# Patient Record
Sex: Male | Born: 1975 | Race: White | Smoking: Former smoker
Health system: Northeastern US, Academic
[De-identification: ages and names within clinical notes are randomized; demographics above are authoritative.]

## PROBLEM LIST (undated history)

## (undated) DIAGNOSIS — F17200 Nicotine dependence, unspecified, uncomplicated: Secondary | ICD-10-CM

## (undated) DIAGNOSIS — R42 Dizziness and giddiness: Secondary | ICD-10-CM

## (undated) DIAGNOSIS — J988 Other specified respiratory disorders: Secondary | ICD-10-CM

## (undated) DIAGNOSIS — I10 Essential (primary) hypertension: Secondary | ICD-10-CM

## (undated) DIAGNOSIS — F32A Depression, unspecified: Secondary | ICD-10-CM

## (undated) DIAGNOSIS — F419 Anxiety disorder, unspecified: Secondary | ICD-10-CM

## (undated) HISTORY — PX: LUMBAR FUSION: SHX111

## (undated) HISTORY — DX: Depression, unspecified: F32.A

## (undated) HISTORY — DX: Anxiety disorder, unspecified: F41.9

## (undated) HISTORY — DX: Other specified respiratory disorders: J98.8

## (undated) HISTORY — DX: Nicotine dependence, unspecified, uncomplicated: F17.200

## (undated) HISTORY — PX: TYMPANOSTOMY TUBE PLACEMENT: SHX32

---

## 1997-12-07 HISTORY — PX: SPINAL FUSION: SHX223

## 2005-09-07 DIAGNOSIS — F988 Other specified behavioral and emotional disorders with onset usually occurring in childhood and adolescence: Secondary | ICD-10-CM | POA: Insufficient documentation

## 2009-03-15 DIAGNOSIS — J309 Allergic rhinitis, unspecified: Secondary | ICD-10-CM | POA: Insufficient documentation

## 2009-10-08 ENCOUNTER — Ambulatory Visit: Payer: Self-pay | Admitting: Primary Care

## 2009-11-13 ENCOUNTER — Ambulatory Visit: Payer: Self-pay | Admitting: Primary Care

## 2009-11-22 ENCOUNTER — Ambulatory Visit: Payer: Self-pay | Admitting: Orthopedic Surgery

## 2009-11-28 ENCOUNTER — Ambulatory Visit: Payer: Self-pay | Admitting: Optometry

## 2009-11-28 NOTE — Progress Notes (Signed)
[name] returns for an unexpected visit.  He is now 33 years of age and is 12  years out from a Gill-type laminectomy of L5 with fusion L5 to S1 done for  a spondylolisthesis.  He returns for acute left-sided low back pain.    PAST MEDICAL HISTORY:  The patient's past medical history was updated.  He  has no known drug allergies.  Active problems include rhinitis and ADHD.    CURRENT MEDICATIONS:  Include Aleve, Flexeril, and Vicodin.    SOCIAL: He is now married and expecting his 1st child with his wife in  April.  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  He is a  nonsmoker.    HISTORY OF PRESENT ILLNESS:  The patient reports his complaints of pain  date back to 1 week ago when he was shoveling snow and developed acute  left-sided low back pain.  The pain was quite severe, and he was  essentially in bed for 4 days.  He has been using Aleve, Flexeril, Vicodin,  and heat.  He is just beginning to report some improvement in his back  complaints and has begun to increase his activity level.  He was  out of  work as a result of the acuity of pain.   He reports these acute flare-ups  approximately once a year.  The pain is always left-sided, and he has never  experienced any radicular or neurologic type of symptoms.  He exercises  regularly.  His exercise program is mostly running.  He has participated in  several marathons and is  training for an Ironman competition in February.  He does very little core strengthening and lumbar strengthening in his  daily routine.    PHYSICAL EXAMINATION:  He is a well-developed, well-nourished, very fit  young man.  He does not present in any acute pain and is ambulating easily.  Extension is performed just beyond neutral with some mild increased  complaints of pain.  Flexion is performed without any difficulty.  Tenderness is noted over the left lumbar para-musculature.    X-RAYS:  I reviewed x-rays taken today in the office.  There is abundant  fusion mass noted at  the L5-S1 level with no evidence of hardware failure.  Very mild adjacent segment degenerative changes noted.  This has not  appreciably changed since his last x-ray in 2009.    PLAN:  I had a nice discussion with [name].  He is quite concerned that he  has these flare-ups on a yearly basis and is worried that when he is in his  20s and 23s he will experiemce chronic problems.  He is quite fit and does  quite a bit of running, but does very little for his core strength.  I  recommended that he meet with [name] in our active spine program through  Sports Medicine.  It would be worthwhile for him to incorporate some lumbar  stabilization and core strengthening into his workout routine.  He is  agreeable to do so.  We discussed the use of Aleve or naproxen for his  flare-ups.  Given that he is feeling much better, he does not need any new  prescriptions at this time.  He plans to gradually ramp up his activity  level.  He does not need any ongoing follow-up and will be seen on an  as-needed basis.  Dictated by:                                                             Lowella Grip,                                                             NP  Electronically Signed and Finalized by  Lowella Grip, NP 11/28/2009 10:16                                                                                                                       ___________________________________________                                                             Lowella Grip,                                                             NP  DD:   11/22/2009  DT:   11/22/2009 11:50 A  ZO/XW#[id number]  [id number]    cc:   Cheri Fowler, MD

## 2009-12-03 ENCOUNTER — Ambulatory Visit: Payer: Self-pay | Admitting: Rehabilitative and Restorative Service Providers"

## 2010-01-28 ENCOUNTER — Ambulatory Visit: Payer: Self-pay | Admitting: Primary Care

## 2010-01-28 NOTE — Progress Notes (Signed)
 Reason For Visit   URI.  HPI   ** Medication reconciliation completed and updated      [name] complains of URI symptoms.     ONSET:  1 days ago.     SYMPTOMS:         Cough  --none         Nasal Congestion        Rhinorrhea with clear phlegm.        No Fever       Chills:  Yes-initially       Sore Throat:  No       Post Nasal Drip:  Yes       Otalgia: No but right side has mild decreased hearing.  No tinnitus.   It started last week after swimming -he is training for a triathelon.  No   recent flying.  He was at a rock concert 01/26/10. Had tubes in his ears as   a child by Dr Rocky Morel.          Hoarseness:  No       Sinus Tenderness:  No       Headache:  No       Shortness of Breath:  No     RELIEVED BY:  took no meds  .  Allergies   No Known Drug Allergy.  Current Meds   None.  Active Problems   Allergic Rhinitis (477.9); h/o allergy shots  Attention-deficit / Hyperactivity Disorder (314.01)  MRI Knee Medial Collateral Ligament Complete Tear Mar 2005; Right  MRI Knee Medial Meniscus Tear Mar 2005; Right.  Vital Signs   Recorded by [facility] on 28 Jan 2010 01:10 PM  BP:102/52,  LUE,  Sitting,   HR: 60 b/min,  L Radial,   Temp: 98.6 F,   Weight: 199.6 lb.  Physical Exam   HEENT: Normal eyes.  Ears normal with normal hearing to whisper   bilaterally. Oropharynx normal. No rhinitis.  NECK: Normal  LUNGS: Clear to auscultation bilaterally     Assessment/Plan:     1.  Viral URI: Presently mild.  He will try Afrin nasal spray twice a day   x3 days as needed.  Call if no better  2.  Probable right eustachian tube dysfunction: Suspect is viral URI has   worsened his symptoms.  He also was at a rock concert 2 days ago which   probably also didn't help.  He also has been swimming for triathlon   training which may be contributing.  He will try Afrin nasal spray twice a   day x3 days, take 2 days off and then resume Afrin x3 days if needed.  If   no better/worse, will consider having him go to Dr. Delrae Alfred  and/or use   Flonase.  He will also start using earplugs when swimming.     RETURN: 02/04/10 HTN/right eustachian tube dysfunction  .  Signature   Electronically signed by: [name]  M.D.; 01/28/2010 1:41 PM EST.

## 2010-02-04 ENCOUNTER — Ambulatory Visit: Payer: Self-pay | Admitting: Primary Care

## 2010-02-04 NOTE — Progress Notes (Signed)
 Reason For Visit   HTN/Right eustacian tube dysfunction.  HPI   HYPERTENSION MANAGEMENT: [name] is here for hypertension   follow-up.  SYMPTOMS:   Other: had 2 episodes dizziness the past 2 days- 1 was after   exersise and 1 was mid day   PATIENT DENIES: Headache, Chest pain,  Palpitations, Shortness of Breath   HABITS:   --Patient has been following a reduced sodium diet.  --He is getting adequate exercise.  --Smoking: No.   --Caffeine Use:  Yes.  cups per day:  3   HOME BLOOD PRESSURE: Does not take.    MEDICATIONS: None He  taking antihypertensive medications correctly     His right hearing has improved since last visit.  Wakes up congested at   times but it improves as the day goes on.       ** Medication reconciliation completed and updated.  Allergies   No Known Drug Allergy.  Current Meds   None.  Active Problems   Allergic Rhinitis (477.9); h/o allergy shots  Attention-deficit / Hyperactivity Disorder (314.01)  MRI Knee Medial Collateral Ligament Complete Tear Mar 2005; Right  MRI Knee Medial Meniscus Tear Mar 2005; Right.  Vital Signs   Recorded by [facility] on 04 Feb 2010 04:01 PM  BP:108/62,  LUE,  Sitting,   HR: 76 b/min,  L Radial,   Weight: 197.4 lb.  Physical Exam   HEARING:  Normal whisper bilaterally  HEART: Regular rate and rhythm, no murmurs gallops or rubs.  LUNGS: Clear to auscultation bilaterally.     Assessment/Plan:     1.  HTN: Patient is actually low.  He is on no medicine.  He does drink at   least 3 cups of coffee a day.  I did recommend he maintain adequate   hydration.  He was also advised to check his blood pressure in the store   and call me if it is still low or he still feels dizzy.  2.  Right eustachian tube dysfunction/decreased hearing: Resolved.  Call if   symptoms recur     RETURN: 3 months HTN.  Signature   Electronically signed by: [name]  M.D.; 02/04/2010 4:28 PM EST.

## 2010-05-13 ENCOUNTER — Encounter: Payer: Self-pay | Admitting: Gastroenterology

## 2010-05-13 ENCOUNTER — Ambulatory Visit: Payer: Self-pay | Admitting: Primary Care

## 2010-05-13 NOTE — Progress Notes (Signed)
Reason For Visit   HTN.  HPI   HYPERTENSION MANAGEMENT: [name] is here for hypertension   follow-up.  SYMPTOMS:    PATIENT DENIES: Headache, Chest pain,  Palpitations, Shortness of Breath   HABITS:   --Patient has been following a reduced sodium diet.  --He is getting adequate exercise-bikes/jogs/swims  --Smoking: No.   --Caffeine Use:  Yes.  cups per day:  2   HOME BLOOD PRESSURE: Does not take.    MEDICATIONS: None      Patient has lost 8 pounds since 01/2010.  His father does have hypertension     ** Medication reconciliation completed and updated.  Allergies   No Known Drug Allergy.  Current Meds   None.  Active Problems   Allergic Rhinitis (477.9); h/o allergy shots  Attention-deficit / Hyperactivity Disorder (314.01)  MRI Knee Medial Collateral Ligament Complete Tear Mar 2005; Right  MRI Knee Medial Meniscus Tear Mar 2005; Right.  Vital Signs   Recorded by [facility] on 13 May 2010 03:56 PM  BP:132/84,  LUE,  Sitting,   HR: 74 b/min,  L Radial, Regular,   Weight: 191 lb.  Recorded by [facility] on 13 May 2010 04:21 PM  BP:110/80.  Physical Exam   HEART: Regular rate and rhythm, no murmurs gallops or rubs.  LUNGS: Clear to auscultation bilaterally.  .  Assessment   Assessment/Plan:     1. HYPERTENSION  --According to JNC 7 guidelines target BP: less than 140/90 patient   currently is at goal  Plan to reach goal includes:  --Lifestyle Modifications: weight reduction; discussed dietary sodium   reduction; discussed aerobic physical activity    --Medication Management: Currently on no medicine     RETURN: end 09/2010 HTN  .  Signature   Electronically signed by: [name]  M.D.; 05/13/2010 4:27 PM EST.

## 2010-06-12 ENCOUNTER — Ambulatory Visit: Payer: Self-pay | Admitting: Orthopedic Surgery

## 2010-06-13 ENCOUNTER — Other Ambulatory Visit: Payer: Self-pay | Admitting: Orthopedic Surgery

## 2010-06-13 ENCOUNTER — Ambulatory Visit: Payer: Self-pay | Admitting: Orthopedic Surgery

## 2010-06-13 DIAGNOSIS — IMO0002 Reserved for concepts with insufficient information to code with codable children: Secondary | ICD-10-CM

## 2010-06-14 ENCOUNTER — Emergency Department
Admission: EM | Admit: 2010-06-14 | Disposition: A | Discharge status: Home / Self Care | Payer: Self-pay | Source: Ambulatory Visit | Attending: Emergency Medicine | Admitting: Emergency Medicine

## 2010-06-14 ENCOUNTER — Encounter: Payer: Self-pay | Admitting: Emergency Medicine

## 2010-06-14 HISTORY — DX: Essential (primary) hypertension: I10

## 2010-06-14 MED ORDER — KETOROLAC TROMETHAMINE 30 MG/ML IJ SOLN *I*
30.0000 mg | Freq: Once | INTRAMUSCULAR | Status: AC
Start: 2010-06-14 — End: 2010-06-14
  Filled 2010-06-14: qty 1

## 2010-06-14 MED ORDER — DIAZEPAM 5 MG PO TABS *I*
5.0000 mg | ORAL_TABLET | Freq: Once | ORAL | Status: AC
Start: 2010-06-14 — End: 2010-06-14
  Administered 2010-06-14: 5 mg via ORAL
  Filled 2010-06-14: qty 1

## 2010-06-14 MED ORDER — HYDROMORPHONE HCL 4 MG PO TABS *I*
4.0000 mg | ORAL_TABLET | Freq: Once | ORAL | Status: AC
Start: 2010-06-14 — End: 2010-06-14
  Administered 2010-06-14: 4 mg via ORAL
  Filled 2010-06-14: qty 1

## 2010-06-14 MED ORDER — DIAZEPAM 5 MG PO TABS *I*
5.0000 mg | ORAL_TABLET | Freq: Three times a day (TID) | ORAL | Status: AC | PRN
Start: 2010-06-14 — End: 2010-06-21

## 2010-06-14 MED ORDER — HYDROMORPHONE HCL 2 MG/ML IJ SOLN
2.0000 mg | Freq: Once | INTRAMUSCULAR | Status: AC
Start: 2010-06-14 — End: 2010-06-14
  Filled 2010-06-14: qty 1

## 2010-06-14 MED ORDER — HYDROMORPHONE HCL 4 MG PO TABS *I*
4.0000 mg | ORAL_TABLET | ORAL | Status: AC | PRN
Start: 2010-06-14 — End: 2010-06-24

## 2010-06-14 NOTE — ED Notes (Addendum)
Patient with spinal fusion in 1998 with Dr. Isabell Jarvis, has been experiencing severe lower back pain radiating down left leg x 72 hours, states he feels like he is being stabbed in leg.  Has appointment with Dr. Isabell Jarvis on Wednesday for myelogram (cannot get MRI due to hardware in back) Taking vicodin, prednisone, and gabapentin.  Pain has not had any improvement with these.     Took Vicodin at 11.    Has not had BM since Thursday.     Will order dilaudid, toradol, valium

## 2010-06-14 NOTE — Discharge Instructions (Signed)
You have been evaluated for low back pain with radicular symptoms in your left leg.  You have been prescribed pain medicine to relieve your symptoms until your CY myelogram on Wednesday.  Take 1 dilaudid every 4 hours. If this does not work, you may try 2 tablets every 4 hours for pain.  Take valium 5mg , 1 tab every 8 hours. Discontinue the vicodin.  Avoid lifting and strenuous tasks.

## 2010-06-14 NOTE — ED Notes (Signed)
Pt resting comfortably in bed. VSS, AOx3, will cont to monitor & tx as ordered.

## 2010-06-14 NOTE — ED Notes (Signed)
Pt arrives with c/o lower back pain & LLE pain x 3 days. Hx of spinal fusion in 1998. Supposed to have myelogram done this coming Wednesday, but pain is progressively getting worse since starting Vicodin, prednisone & gabapentin 2 days ago. Pt denies numbness in LLE, but does c/o tingling in LLE. Pt is AOx3, VSS, will cont to monitor & tx as ordered.

## 2010-06-14 NOTE — ED Provider Notes (Signed)
History   Chief Complaint   Patient presents with   . Back Pain     hx bone; spur,  has pain L leg       HPI Comments: 34yo male c/o back pain for 3 weeks. Lower back pain after lifting a heavy object, and has slowly worsened. Stabbing pain in his left hip, quadriceps and left foot began on Thursday, and have slowly worsened. Patient complains of burning, aching, muscle twitches, and tingling. Areas affected include lateral lower leg, quadricep, and hip. Complains of numbness in tingling in feet and lateral lower leg, more on the left than on the right. Denies urinary problems, fever, shooting pain. He was seen 06/12/10 in the spine clinic and perscribed gabapentin, vicodin, and prednisone.    The history is provided by the patient.       Past Medical History   Diagnosis Date   . Asthma    . Hypertension      monitored every 4 months, no medication         Past Surgical History   Procedure Date   . Spinal fusion L5-S1         History reviewed.  No pertinent family history.     reports that he has never smoked. He does not have any smokeless tobacco history on file.  He reports that he drinks about 2.5 ounces of alcohol per week.  He reports that he does not currently use illicit drugs.    Review of Systems   Review of Systems   Constitutional: Negative for fever, chills and diaphoresis.   HENT: Negative.    Eyes: Negative.  Negative for visual disturbance.   Respiratory: Negative.  Negative for cough, shortness of breath and wheezing.    Cardiovascular: Negative for chest pain and leg swelling.   Gastrointestinal: Positive for constipation. Negative for vomiting and diarrhea.   Genitourinary: Positive for testicular pain (when standing, left sided). Negative for flank pain and difficulty urinating.   Musculoskeletal: Positive for back pain.        Left hip pain, left quadriceps twitching, left lower leg pain, numbness and tingling in toes and shins, more on the left than on the right.    Skin: Negative for rash.      Neurological: Positive for weakness and light-headedness (intermittend/positional). Negative for tremors and syncope.        Left toes partially numb, tingling.   Psychiatric/Behavioral: Negative.        Physical Exam   BP 150/92  Pulse 60  Temp(Src) 36.2 C (97.2 F) (Temporal)  Resp 19  Ht 1.753 m (5\' 9" )  Wt 86.183 kg (190 lb)  BMI 28.06 kg/m2  SpO2 96%    Physical Exam   Constitutional: He is oriented to person, place, and time. He appears well-developed and well-nourished.   HENT:   Head: Normocephalic and atraumatic.   Right Ear: External ear normal.   Left Ear: External ear normal.   Eyes: Conjunctivae and EOM are normal. Pupils are equal, round, and reactive to light.   Neck: Normal range of motion. Neck supple.   Cardiovascular: Normal rate, regular rhythm, normal heart sounds and intact distal pulses.    Pulmonary/Chest: Effort normal and breath sounds normal. No respiratory distress. He has no wheezes. He has no rales.   Abdominal: Soft. Bowel sounds are normal. No tenderness.   Musculoskeletal:        Decreased ROM of back flexion/extension. Rotation to 70 degrees bilaterally.   Neurological:  He is alert and oriented to person, place, and time. He has normal reflexes.        Slight weakness of left lower extremity, reflexes 2+/4 bilaterally. Slight numbness in left foot, lateral lower leg, and thigh.   Skin: Skin is warm and dry. No rash noted. He is not diaphoretic.   Psychiatric: He has a normal mood and affect.       Medical Decision Making   MDM  Number of Diagnoses or Management Options  Diagnosis management comments: Patient seen by me today, 06/14/2010 at 1500.    Assessment:  34 y.o., male comes to the ED with  Low back pain, that is worsening, with radicular symptoms.  Differential Diagnosis includes bulging disk, piriformis syndrome, spinal stenosis, bone spur, lumbar strain.    Plan: Patient has myelogram scheduled for Wednesday. Will focus on pain control until that time. Will try to  arrange myelogram to be moved up to get it sooner. Plan to call radiology.  1. Dilaudid 4mg  PO q4hrs prn pain  2. Valium                 Jaci Carrel, MD      Patient seen by me on arrival date of 06/14/2010 at 1633.    History:   I reviewed this patient, reviewed the resident note and agree    Exam:   I examined this patient, reviewed the resident note and agree    Decision Making:   I discussed with the documented resident decision making  and agree          Author Jarrett Ables, MD      Jarrett Ables, MD  06/15/10 [phone]

## 2010-06-14 NOTE — ED Notes (Signed)
Pt resting comfortably in bed with MD at bedside. VSS, Will cont to monitor & tx as ordered.

## 2010-06-14 NOTE — ED Notes (Signed)
Pt here with his wife, for severe low back pain.  Has upcoming appt for further evaluation of his back.  Has hardware in his back.  Cannot have MRI.  To get myelogram later.  MLP ordering pain medication.  Pt has no bowel or bladder incontinence.

## 2010-06-17 ENCOUNTER — Ambulatory Visit: Payer: Self-pay | Admitting: Orthopedic Surgery

## 2010-06-18 ENCOUNTER — Ambulatory Visit
Admit: 2010-06-18 | Discharge: 2010-06-18 | Disposition: A | Discharge status: Home / Self Care | Payer: Self-pay | Source: Ambulatory Visit | Attending: Orthopedic Surgery | Admitting: Orthopedic Surgery

## 2010-06-18 MED ORDER — MEPERIDINE HCL 50 MG/ML IJ SOLN *WRAPPED*
INTRAMUSCULAR | Status: AC
Start: 2010-06-18 — End: 2010-06-18
  Filled 2010-06-18: qty 1

## 2010-06-18 MED ORDER — MIDAZOLAM HCL 2 MG/ML PO SYRP *I*
ORAL_SOLUTION | ORAL | Status: AC
Start: 2010-06-18 — End: 2010-06-18
  Filled 2010-06-18: qty 5

## 2010-06-18 NOTE — Progress Notes (Signed)
[name] returns today for an urgent follow-up visit.  He was last seen in  December 2010 for recurrent low back pain, which responded quite well to  NSAIDs and physical therapy through our sports medicine department.  He  returns today for acute and severe radiating left leg pain.    The patient is now 34 years of age and is 12 years out from a Gill-type  laminectomy of L5 with an instrumented fusion L5 to S1 done for  spondylolisthesis.  He reports continuing with some very low-grade  discomfort in the low back since being seen 6 months ago.  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  At that time he had  acute pain beginning in the left buttock and traveling down the left leg to  the shin.  He describes an L4 pattern to his symptoms with numbness and  tingling in the same distribution.  He contacted the office and was started  on a Medrol dose pack.  The first day of the dose pack helped his pain,  however, it quickly returned with days 2 through 5.  During today's visit  his pain is a 9/10 in severity and he appears quite uncomfortable.  The  pain is significantly worse when he is standing and walking.  He became  quite diaphoretic and nearly collapsed while getting his x-ray in Radiology  today.  He denies any bowel or bladder incontinence.  The pain is present24  hours a day.  He has the sensation that his left leg is weak and will not  support him when standing.    PHYSICAL EXAMINATION:  He is a well-developed, well-nourished male who  ambulates with an antalgic gait, favoring the left leg.  Blood pressure  155/86 with heart rate of 62 during his diaphoretic episode.  He has  diminished light touch sensation in the left L4 and L5 dermatomes.  Full  strength is noted with no focal weakness.  He has a positive seated  straight leg raise.    X-RAYS:  I obtained new x-rays today in the office.  There are 5  lumbar-type  vertebrae.  There is evidence of an instrumented fusion using  [place] at L5-S1.  Abundant fusion mass is noted.  There is some narrowing of  the foramina seen on the lateral at the level above the fusion.    ASSESSMENT:  Acute progressive lumbar radiculopathy.    PLAN:  The patient will be started on prednisone taper.  I he will be  started on gabapentin 300 mg at bedtime.  I have scheduled him for an  urgent CT myelogram to evaluate for any neural compression.  He was given a  prescription for some Vicodin.  I will call him at home with the results of  his myelogram as soon as they are available.  If indicated he may be a  candidate for an epidural injection with one of our physiatrists.                                                                   Dictated by:  Lowella Grip,                                                             NP  Electronically Signed and Finalized by  Lowella Grip, NP 06/18/2010 09:36                                                                                                                       ___________________________________________                                                             Lowella Grip,                                                             NP  DD:   06/12/2010  DT:   06/12/2010  3:53 P  [id number]  [id number]    cc:   Cheri Fowler, MD

## 2010-06-19 ENCOUNTER — Ambulatory Visit: Payer: Self-pay | Admitting: Anesthesiology

## 2010-06-19 ENCOUNTER — Ambulatory Visit: Payer: Self-pay | Admitting: Physical Medicine and Rehabilitation

## 2010-06-19 ENCOUNTER — Ambulatory Visit: Payer: Self-pay | Admitting: Neurosurgery

## 2010-06-20 DIAGNOSIS — M48061 Spinal stenosis, lumbar region without neurogenic claudication: Secondary | ICD-10-CM | POA: Insufficient documentation

## 2010-06-20 DIAGNOSIS — M5126 Other intervertebral disc displacement, lumbar region: Secondary | ICD-10-CM | POA: Insufficient documentation

## 2010-06-20 NOTE — H&P (Signed)
Preoperative H and P    Chief Complaint: leg pain    History of Present Illness:  HPI  34 year old male with a history of L5-S1 fusion in 1998 presents with severe left leg pain, worsening over the last month.  He notes that in Dec. 2010 he began having low back pain after shoveling snow.  This improved and returned several times; however the pain became worse with each exacerbation.  Approximately a month ago he began experiencing shooting pain in his left hip, anterior thing and calf.  He has had some associated numbness, but that has improved since starting gabepentin.  The leg pain has become more severe and now he is on dilaudid, valium, gabepentin and prednisone.  This enables him to tolerate the pain, but he does not like how he feels on the drugs.  He underwent CT Myelogram on 06/18/10 which demonstrated a very large L4-5 disc fragment.    He presents for elective surgery.    Past Medical History   Diagnosis Date   . Asthma    . Hypertension      monitored every 4 months, no medication       Past Surgical History   Procedure Date   . Spinal fusion L5-S1       History reviewed.  No pertinent family history.  History   Social History   . Marital Status: Married     Spouse Name: N/A     Number of Children: N/A   . Years of Education: N/A   Social History Main Topics   . Smoking status: Never Smoker    . Smokeless tobacco: None   . Alcohol Use: 2.5 oz/week     5 drink(s) per week   . Drug Use: No   . Sexually Active:    Other Topics Concern   . None   Social History Narrative   . None         Allergies: No Known Allergies    Medications  Outpatient prescriptions as of 06/14/2010   Medication   . diazepam (VALIUM) 5 MG tablet   . HYDROmorphone (DILAUDID) 4 MG tablet   . gabapentin (NEURONTIN) 300 MG capsule   . predniSONE (DELTASONE) 20 MG tablet   Facility-administered medications as of 06/14/2010   Medication Dose Frequency   . midazolam (VERSED) 2 MG/ML syrup      . meperidine (DEMEROL) 50 MG/ML injection      .  meperidine (DEMEROL) 50 MG/ML injection      . HYDROmorphone (DILAUDID) injection 2 mg  2 mg Once   . ketorolac (TORADOL) injection 30 mg  30 mg Once   . diazepam (VALIUM) tablet 5 mg  5 mg Once   . HYDROmorphone (DILAUDID) tablet 4 mg  4 mg Once   . diazepam (VALIUM) tablet 5 mg  5 mg Once       Review of Systems:   Review of Systems   Musculoskeletal: Positive for back pain.   All other systems reviewed and are negative.        Physical Exam   Constitutional: He is oriented to person, place, and time. He appears well-developed.   HENT:   Head: Normocephalic.   Eyes: Conjunctivae and EOM are normal. Pupils are equal, round, and reactive to light.   Neck: Normal range of motion.   Cardiovascular: Normal rate and regular rhythm.    Pulmonary/Chest: Effort normal.   Abdominal: Soft.   Musculoskeletal: Normal range of motion. He  exhibits no edema.   Neurological: He is alert and oriented to person, place, and time. He has normal strength. Coordination abnormal.   Reflex Scores:       Patellar reflexes are 0 on the right side and 0 on the left side.       Achilles reflexes are 0 on the right side and 0 on the left side.       Patient walks with an antalgic gait, favoring his left leg.   Skin: Skin is warm and dry.       Lab Results:     Radiology impressions (last 3 days):      Assessment: 34 year old with large herniated disc, failed conservative treatment.    Plan: minimally invasive discectomy.    Author: Juliann Pares, NP  Note created: 06/20/2010  at: 10:02 AM

## 2010-06-23 ENCOUNTER — Ambulatory Visit: Payer: Self-pay | Admitting: Orthopedic Surgery

## 2010-06-23 ENCOUNTER — Ambulatory Visit
Admit: 2010-06-23 | Discharge: 2010-06-23 | Disposition: A | Discharge status: Home / Self Care | Payer: Self-pay | Source: Ambulatory Visit | Attending: Neurosurgery | Admitting: Neurosurgery

## 2010-06-23 LAB — COMPREHENSIVE METABOLIC PANEL
ALT: 31 U/L (ref 0–50)
AST: 25 U/L (ref 0–50)
Albumin: 5.1 g/dL (ref 3.5–5.2)
Alk Phos: 69 U/L (ref 40–130)
Anion Gap: 10 (ref 7–16)
Bilirubin,Total: 0.4 mg/dL (ref 0.0–1.2)
CO2: 29 mmol/L — ABNORMAL HIGH (ref 20–28)
Calcium: 9.7 mg/dL (ref 9.0–10.3)
Chloride: 96 mmol/L (ref 96–108)
Creatinine: 0.85 mg/dL (ref 0.67–1.17)
GFR,Black: >59 *
GFR,Caucasian: >59 *
Glucose: 96 mg/dL (ref 74–106)
Lab: 20 mg/dL (ref 6–20)
Potassium: 4 mmol/L (ref 3.3–5.1)
Sodium: 135 mmol/L (ref 133–145)
Total Protein: 7.6 g/dL (ref 6.3–7.7)

## 2010-06-23 LAB — CBC AND DIFFERENTIAL
Baso # K/uL: 0.1 THOU/uL (ref 0.0–0.1)
Basophil %: 0.8 % (ref 0.2–1.2)
Eos # K/uL: 0 THOU/uL (ref 0.0–0.5)
Eosinophil %: 0 % — ABNORMAL LOW (ref 0.8–7.0)
Hematocrit: 45 % (ref 40–51)
Hemoglobin: 15.6 g/dL (ref 13.7–17.5)
Lymph # K/uL: 2.9 THOU/uL (ref 1.3–3.6)
Lymphocyte %: 21.7 % — ABNORMAL LOW (ref 21.8–53.1)
MCV: 89 fL (ref 79–92)
Mono # K/uL: 1.2 THOU/uL — ABNORMAL HIGH (ref 0.3–0.8)
Monocyte %: 9.2 % (ref 5.3–12.2)
Neut # K/uL: 8.8 THOU/uL — ABNORMAL HIGH (ref 1.8–5.4)
Platelets: 296 THOU/uL (ref 150–330)
RBC: 5 MIL/uL (ref 4.6–6.1)
RDW: 12.4 % (ref 11.6–14.4)
Seg Neut %: 66.7 % (ref 34.0–67.9)
WBC: 13 THOU/uL — ABNORMAL HIGH (ref 4.2–9.1)

## 2010-06-23 LAB — MANUAL DIFFERENTIAL

## 2010-06-23 LAB — PROTIME-INR
INR: 0.9 (ref 0.9–1.1)
Protime: 12.6 s (ref 11.9–14.7)

## 2010-06-23 LAB — APTT: aPTT: 25.2 s (ref 22.3–35.3)

## 2010-06-23 LAB — BANDS: Bands %: 1 % (ref 0–10)

## 2010-06-23 LAB — MISC. CELL %: Misc. Cell %: 0 % (ref 0–0)

## 2010-06-23 LAB — REACTIVE LYMPHS: React Lymph %: 1 % (ref 0–6)

## 2010-06-24 NOTE — Progress Notes (Signed)
[name] and his wife return.  The patient was operated by me for 5-1  fusion about 12 years ago.  He has done very well over the years with some  intermittent setbacks.  He reports that as of last fall, he trained for an  [employer].  In December, he did something to his back and has had some  increased back pain with a lot of frustration with this over the course of  the spring.  On June 18, he picked up at a casket at work, and something  happened where he had some decrease in back pain but a marked increase in  leg pain, and in fact, he is having severe pain down his left leg.  The  pain seems to be mostly in the L5 pattern.    I examined him today.  His leg touch is slightly diminished in an L4 aspect  of the left leg, and he has some weakness of the left EHL.    The patient has had x-rays and a myelogram.  He has a status post fusion  L5-S1 with a screw plate, [employer] stainless steel construct.  At the level of  L4-5, he has a large disk herniation which has migrated up behind the body  of L4 and causing substantial canal compromise.  It is a large disk  herniation.    We had a long discussion regarding his situation.  Since initially being  brought through my office with urgent care, he has sought out a second  opinion from a neurosurgeon and had an epidural injection.  This happened  to be the recommendation that we had made to him, but he did not want to  pursue it because he had not seen me.    The patient subsequently had the injection, and in fact, it has made quite  a difference for him.  Overall, he is perhaps 50% better.    I have discussed the situation with him.  Realistically, I think he ought  to give this some more time before jumping into any discussion of surgery.  A second injection might not be a bad idea either.  I have discussed with  him that if he had further surgery, I would recommend extending the fusion  up a level to L4.  Despite the fact that he finds this somewhat of a  distasteful  idea, I really think it is in his best interest.  I think if he  does not have the 4-5 segment fused, the chance of a recurrent problem at  that level is extremely high.  The patient's wife came part-way through our  visit.  We spent about 30 minutes talking about the situation and talking  about my recommendations for how I would proceed and why I would proceed in  this way.  The patient's questions and his wife's questions were answered  in great detail.  We have agreed to re-evaluate him again in 3 to 4 weeks.  I would be happy to organize an injection for him in a week or so if he is  still having a lot of pain.  Their questions were answered in detail.                Electronically Signed and Finalized  by  Danton Sewer, MD 06/24/2010 11:42  ___________________________________________  Danton Sewer, MD      DD:   06/23/2010  DT:   06/24/2010  9:42 A  [id number]  [id number]    cc:   Cheri Fowler, MD

## 2010-06-25 ENCOUNTER — Ambulatory Visit
Admit: 2010-06-25 | Discharge: 2010-06-25 | Payer: Self-pay | Source: Ambulatory Visit | Attending: Neurosurgery | Admitting: Neurosurgery

## 2010-07-01 ENCOUNTER — Ambulatory Visit: Payer: Self-pay | Admitting: Physical Medicine and Rehabilitation

## 2010-07-08 ENCOUNTER — Ambulatory Visit: Payer: Self-pay | Admitting: Neurosurgery

## 2010-07-15 ENCOUNTER — Ambulatory Visit: Payer: Self-pay | Admitting: Physical Medicine and Rehabilitation

## 2010-07-21 ENCOUNTER — Ambulatory Visit: Payer: Self-pay | Admitting: Orthopedic Surgery

## 2010-07-25 ENCOUNTER — Ambulatory Visit: Payer: Self-pay | Admitting: Orthopedic Surgery

## 2010-07-28 NOTE — Progress Notes (Signed)
[name] returns.  He is now 2 months since the onset of severe left leg pain.  He is off all pain medicines at this point.  His leg pain still occurs but  it is quite sporadic.  It can last a few seconds to a few minutes but he  does not have any continuous pain.  Upon until 2 weeks ago, he had numbness  in the lateral left calf but this is gone.    He is doing his full duties at work, other than being careful about  lifting.  He is really feeling a lot better.  He has been increasing his  activities with [name], his therapist.  He has been thinking about  going back to sports if I will okay it.    Today on exam, he walks with a nice gait.  Toe and heel walking are normal.  He reports no numbness or pain in his legs at this point.    I have given him clearance to continue working on core exercise with [name].  He may begin riding his bike.  The patient found that swimming  did not work out very well but I encouraged him to try it again after he  does more core strengthening.  We talked about the benefits of running and  the chances that he may need to hold off on running.    I encouraged the patient and his wife to think about things over the next  coming weeks as he increases his activities.  The next decision point is a  month from now.  If at that point he is not doing everything he wants to  do, he needs to decide what it is that he would like to do further, and  whether it is worth undergoing the theoretical risks of surgery.  We had an  excellent discussion.  He will come back to see me if he sees the need.                Electronically Signed and Finalized  by  Danton Sewer, MD 07/28/2010 12:25  ___________________________________________  Danton Sewer, MD      DD:   07/25/2010  DT:   07/26/2010  4:52 P  [id number]  [id number]    cc:   Cheri Fowler, MD

## 2010-08-06 ENCOUNTER — Ambulatory Visit: Payer: Self-pay | Admitting: Primary Care

## 2010-08-06 NOTE — Progress Notes (Signed)
Reason For Visit   Headaches.  HPI   ** Medication reconciliation completed and updated *     He has been getting headaches x 1 month.  Headaches are in his forehead and   can be sharp pain. No radiation.  No visual symptoms.  No N/V/photo or   phonophobia/injuries/f/c/eye tearing/rhinitis.  No paresthesias.  He does   feel more thirsty. Had a normal BG 06/2010. His left leg is weaker due to    herniated disc L4-L5.  He gets headaches 4-5 days a week.  They can last   all day at times.  He admits he has alot of stress in his life-having   lumbar back problems, busy at work, not sleeping well with a 54 month old.   Advil mildly helps but he admits to waiting to take it.   Mother has   nigraines and he took her Zomig which did not help.  Allergies   No Known Drug Allergy.  Current Meds   Multivitamins Tablet;TAKE 1 TABLET DAILY.; RPT  Glucosamine-Chondroitin-MSM TABS;TAKE 1 TABLET DAILY; RPT  Vitamin D 1000 UNIT Tablet;TAKE 1 TABLET DAILY.; RPT.  Active Problems   Allergic Rhinitis (477.9); h/o allergy shots  Attention-deficit / Hyperactivity Disorder (314.01)  Herniated Lumbar Disc (722.10); L4-L5  Severe Lumbar Canal Stenosis (724.02); L4-L5  MRI Knee Medial Collateral Ligament Complete Tear Mar 2005; Right  MRI Knee Medial Meniscus Tear Mar 2005; Right  History of Reactive Airway Disease (493.90).  Vital Signs   Recorded by ROSA,JODIE on 06 Aug 2010 01:01 PM  BP:152/84,  LUE,  Sitting,   HR: 80 b/min,  L Radial, Regular,   Weight: 187 lb.  Physical Exam   GENERAL: Pleasant 34 year old white male in no acute distress.  Alert and   oriented x3  NEURO:  Cranial nerves 2-12 intact.  Normal motor/sensory/reflexes except   left lower leg has 4+/5 motor strength with slightly decreased pinprick and   reflexes     Assessment/Plan:     1.  Headaches: Unclear cause.  Probably atypical migraine especially given   family history of migraines in his mother.  Suspect his left lower   extremity deficits are from his herniated disc.   Patient does admit to   being more thirsty while these headaches have been occurring.  Blood sugar   last month was normal.  Will check a CT of the head.  We did talk about   possibly using narcotic medications for pain, however he declined since he   has taken them in the past and can get sedated/constipated.  He is want to   try some indomethacin 25 mg twice a day as needed with food.  Side effects   reviewed.  I did give him a handout on headaches and reviewed triggers of   migraines.  He was advised to keep a headache diary.  Patient was advised   to call me if not better/worse.  He may require a consultation from Dr. Loreta Ave        RETURN: end 09/2010 HTN.  Signature   Electronically signed by: [name]  M.D.; 08/06/2010 3:13 PM EST.

## 2010-08-08 ENCOUNTER — Other Ambulatory Visit: Payer: Self-pay | Admitting: Primary Care

## 2010-09-02 ENCOUNTER — Ambulatory Visit: Payer: Self-pay | Admitting: Primary Care

## 2010-09-02 NOTE — Progress Notes (Signed)
Reason For Visit   Chest pain.  HPI   ** Medication reconciliation completed and updated      He has had dull episodic left chest pain x 1 week.  More often in the   morning.  No radiation of the pain.  He has had some occasional tingelling   in left arm   1 week as well but independent of the chest pain.  No N/V/heartburn/cough.    No wheezing but occasionally feels SOB.  Nothing makes the CP   better/worse-it resolves on its own.  Denies any recent heavy lifting.    Denies any new stressors.  He did drive to [facility] 1/61/09 and drove   back 08/04/10.  No calf pain/swelling.  Allergies   No Known Drug Allergy.  Current Meds   Vitamin D 1000 UNIT Tablet;TAKE 1 TABLET DAILY.; RPT  Glucosamine-Chondroitin-MSM TABS;TAKE 1 TABLET DAILY; RPT  Multivitamins Tablet;TAKE 1 TABLET DAILY.; RPT.  Active Problems   Allergic Rhinitis (477.9); h/o allergy shots  Attention-deficit / Hyperactivity Disorder (314.01)  Herniated Lumbar Disc (722.10); L4-L5  Severe Lumbar Canal Stenosis (724.02); L4-L5  MRI Knee Medial Collateral Ligament Complete Tear Mar 2005; Right  MRI Knee Medial Meniscus Tear Mar 2005; Right  History of Reactive Airway Disease (493.90).  POCT   EKG Interpretation:  NSR      Rate: 68  Comments: inverted T III -No old EKG.  Vital Signs   Recorded by ROSA,JODIE on 02 Sep 2010 01:38 PM  BP:168/92,  LUE,  Sitting,   HR: 72 b/min,  L Radial, Regular,   Weight: 192.8 lb.  Recorded by rpietropaoli on 02 Sep 2010 02:20 PM  O2 Sat: 97 (%SpO2),  RA.  Recorded by rpietropaoli on 02 Sep 2010 02:24 PM  BP:120/70.  Physical Exam   NECK: No palpable tenderness.  Full range of motion.  No radicular symptoms   with range of motion.  NEURO:  Normal reflexes/sensation/strength in both arms  CHEST: No reproducible tenderness  HEART: Regular rate and rhythm, no murmurs gallops or rubs.  LUNGS: Clear to auscultation bilaterally.  LEGS:  No calf swelling or tenderness.  Negative Homans sign bilaterally        Assessment/Plan:     1.   Chest pain: Unclear cause.  EKG with no acute source.  Patient does   have a history of reactive airways, however O2 sat was normal.  He may have   some occult reflux.  I recommended he try Prilosec 20 mg daily x2 weeks.    Patient will call me if not better/worse.  2.  Left arm tingling: Possible cervical radiculopathy.  Consider x-ray/MRI   if no better/worse-he declined X-ray today  3.  Health maintenance: Flu shot given     RETURN:  09/2010 HTN.  Signature   Electronically signed by: [name]  M.D.; 09/02/2010 2:33 PM EST.

## 2010-09-06 DIAGNOSIS — H9191 Unspecified hearing loss, right ear: Secondary | ICD-10-CM | POA: Insufficient documentation

## 2010-09-24 ENCOUNTER — Ambulatory Visit: Payer: Self-pay

## 2010-09-30 ENCOUNTER — Ambulatory Visit: Payer: Self-pay | Admitting: Primary Care

## 2010-10-02 ENCOUNTER — Other Ambulatory Visit: Payer: Self-pay | Admitting: Otolaryngology

## 2010-10-03 ENCOUNTER — Ambulatory Visit: Payer: Self-pay | Admitting: Primary Care

## 2010-11-11 ENCOUNTER — Ambulatory Visit: Payer: Self-pay | Admitting: Otolaryngology

## 2010-11-12 ENCOUNTER — Ambulatory Visit: Payer: Self-pay | Admitting: Primary Care

## 2010-11-12 NOTE — Progress Notes (Signed)
 Reason For Visit   Right hearing loss/HTN.  HPI   ** Medication reconciliation completed and updated     He developed sudden hearing loss right ear 09/23/10.  Saw Dr Delrae Alfred and   was given Medrol.  It did not help so he had a transtympanic steroid   injection which helped x 1 week.  Was then put on a Prednisone taper x 12   days which helped  until last weekend-has ear fullness/mild right hearing   loss/tinnitus if in a quiet room.  Occasional dizziness but no vertigo.    +frontal HA x 1 day, they were occipital in October.  Had neg CT head   08/12/10 and neg CT right temporal area 10/31/1.  Also had a neg MRI head.    Dr Delrae Alfred apparently did vasculitis labs that were normal.  He saw Dr   Ardyth Man yesterday who felt he may have Menieres/cochlear hydrops.  He gave   him Dyazide which he has not started yet.    His BP has been labile since being sick. Feels more tired-has a 63 month old   and gets paged at night for work. Denies CP/SOB.  Had faster heart beat on   Prednisone. He is starting to avoid coffee/salt.  Sporadic jogging.  Uses   Aleve 2x a week for his lumbar pain.  Allergies   Latex-asked/denied  No Known Drug Allergy.  Current Meds   Multivitamins Tablet;TAKE 1 TABLET DAILY.; RPT  Vitamin D 1000 UNIT Tablet;TAKE 1 TABLET DAILY.; RPT  Triamterene-HCTZ 37.5-25 MG Tablet;TAKE 1 TABLET DAILY.; Rx.  Active Problems   Allergic Rhinitis (477.9); h/o allergy shots  Attention-deficit / Hyperactivity Disorder (314.01)  Hearing Loss Oct 2011; Right (389.9)  Herniated Lumbar Disc (722.10); L4-L5  Severe Lumbar Canal Stenosis (724.02); L4-L5  MRI Knee Medial Collateral Ligament Complete Tear Mar 2005; Right  MRI Knee Medial Meniscus Tear Mar 2005; Right  History of Reactive Airway Disease (493.90).  Vital Signs   Recorded by ROSA,JODIE on 12 Nov 2010 07:29 AM  BP:134/84,  LUE,  Sitting,   HR: 68 b/min,  L Radial, Regular,   Weight: 194 lb  Recorded by [facility] on 11 Nov 2010 02:15 PM  BP:153/88,   HR: 66 b/min,    Resp: 12 r/min,   Temp: 37.0 C,   Pain Scale: 0,   O2 Sat: 99 (%SpO2).  Recorded by rpietropaoli on 12 Nov 2010 07:59 AM  BP:130/80.  Physical Exam   EARS:  Decreased with her right ear.  Normal left tympanic membrane.  Right   tympanic membrane does have some otosclerosis as well as some crusted blood   from his  steroid injection.  HEART: Regular rate and rhythm, no murmurs gallops or rubs.  LUNGS: Clear to auscultation bilaterally.     Assessment/Plan:     1.  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  Unremarkable workup so far. He will start his   Dyazide today.  I gave him a lab slip to check an SMA-8 in one week.  He   will followup with Dr. Ardyth Man  2.  HTN: Labile.  Currently at goal of less than 140/90.  He will start   Dyazide as above.  Diet and exercise reviewed.  3.  Fatigue: Will check a CBC/TSH.     RETURN: 2 weeks HTN.  Signature   Electronically signed by: [name]  M.D.; 11/12/2010 8:07 AM EST.

## 2010-11-17 NOTE — Progress Notes (Signed)
 Otolaryngology   November 11, 2010        Dear Dr. [name]     [name] was seen on November 11, 2010.  As you probably know, he   experienced a sudden onset of hearing loss in right ear in September 2011.    Initially, this was thought to be a sudden hearing loss and he was treated   with Medrol for roughly 5 days.  He did not feel that this was particularly   helpful.  He then underwent placement of transtympanic steroids.  He felt   that there was some improvement in the hearing.  This was followed by 12   days of prednisone when the hearing seemed to decline once again.  Again,   there was some improvement in his hearing.  He finished the prednisone last   Thursday and, since that time, there is an increase in the pressure and   increased hearing loss in the right ear.  In fact, yesterday, he began   experiencing a sensation of dizziness which lasted all day.  He describes   this as feeling "detached" with any quick movement.  He was uneasy on his   feet.  There was no true spinning vertigo.  The dizziness is slightly   improved today, but has not resolved completely.  He does experience   tinnitus and fullness in the right ear.  He has had no symptoms on the   left.  Aside from a childhood history of acute otitis, there has been no   recent history of infection.  He did have tubes during his childhood, but   no other history of ear surgery.  He denies head injuries, noise exposure   or exposure to ototoxic medications.  There has been some history of   headache over the past 5 to 6 months localized to the occipital region and   occasionally in the frontal region.  However, he is not convinced that   there is any relationship to his other symptoms at this time.  He denies   headaches or tension headaches.  There have been no other focal neurologic   symptoms.  There's been no family history of hearing loss.  He is a   nonsmoker, drinks two alcoholic beverages per week and consumes 2 cups of    coffee per day.  His only current medications are multivitamins and vitamin   D.     The pulse is 66, regular and symmetric.  His blood pressure is 153/88.  He   is in no acute distress.  The external auditory canals are clear.  The   tympanic membranes are intact, clear and mobile.  The fistula test is   negative.  The nose and throat are clear without inflammation or drainage.    The nasal septum is moderately deviated to the right.  The mucosa is benign   and healthy.  The tonsils are small and benign.  The neck is benign without   adenopathy or masses.  The remainder of the ENT exam is unremarkable.    There is no spontaneous or gaze nystagmus.  Hallpike testing is negative   bilaterally.   Romberg, tandem Romberg, and the stepping test are all   within normal limits.  Cranial nerve testing is within normal limits and   symmetric.  The remainder of the neuro-otologic exam is within normal   limits.  By tuning fork exam, the hearing is relatively symmetric in the  lower frequencies and upper frequencies at this time, his Weber is midline   and air conduction is greater than bone conduction bilaterally.  I have   reviewedthe his series of audiograms and there does seem to be some   fluctuation of  low frequency hearing in the right ear.     The status of the exam has been reviewed with [name].  It is possible   that this problem represents Mnire's disease or cochlear hydrops   involving the right ear.  Therefore, I have suggested a trial of Dyazide   daily and a low salt and low caffeine regimen.  He should continue on the   Dyazide for 3 months and then return for follow-up at which time we will   complete an audiogram.  He will complete a baseline audiogram today for   future comparison.  Obviously, if he begins to have increasing difficulties   over the next couple of months, he will get back in touch with me.     Thank you for the opportunity of seeing [name] in consultation and   participating in  his care.                 Sincerely,        Trina Ao. Dola Factor., MD  Associate Professor and Associate Chairman  Department of Otolaryngology/Director, [facility] [place]   [place]     Dictated using Chemical engineer.  Signature   Electronically signed by: Bettey Costa  MD Attend.; 11/17/2010 6:17 PM EST.

## 2010-11-18 ENCOUNTER — Ambulatory Visit: Payer: Self-pay | Admitting: Primary Care

## 2010-11-18 NOTE — Progress Notes (Signed)
 Reason For Visit   Headache/Right hearing loss.  HPI   ** Medication reconciliation completed and updated-except he held Dyazide   yesterday due to headaches     He started to develop headaches 5 months ago. He did have a lumbar   myelogram 06/18/10.   Gets   4 a week and can last up to 2 days. Headaches are frontal and this morning   it started right forehead and then progressed across forehead.  Denies any   HA in the evening/sleeping.  HA can be sharp or throbbing.  Shaking/turning   head can increase HA.  Occasional mild nausea but no emesis.  Denies   photophobia/phonophobia/eye tearing. Occasional sporadic rhinitis.  Had   right cheek tingelling a few times.  No visual aura.  Mother has migraines.    Had a neg CT/MRI head. Vasculitis labs were normal.  Has tried Indocin   with minimal relief.  No fevers, occasionally feels cold.  No meningismus   but has a mild ache lower posterior cervical area.      His right hearing loss is still fluctuating.  Had an audiogram yesterday by   Dr Benedict Needy and it is poor again. Has tinnitus/vertigo.  Dr Rocky Morel wrote a   script for Prednisone taper x 30 days,  60mg  to be decreased by 5mg  every 3   days.  He stopped drinking coffee   1 week ago-was 5-6 cups/day.  Allergies   Latex-asked/denied  No Known Drug Allergy.  Current Meds   PredniSONE 5 MG Tablet;TAKE AS DIRECTED; RPT  Triamterene-HCTZ 37.5-25 MG Tablet;TAKE 1 TABLET DAILY.; Rx  Multivitamins Tablet;TAKE 1 TABLET DAILY.; RPT  Vitamin D 1000 UNIT Tablet;TAKE 1 TABLET DAILY.; RPT.  Active Problems   Allergic Rhinitis (477.9); h/o allergy shots  Attention-deficit / Hyperactivity Disorder (314.01)  Hearing Loss Oct 2011; Right (389.9)  Herniated Lumbar Disc (722.10); L4-L5  Severe Lumbar Canal Stenosis (724.02); L4-L5  MRI Knee Medial Collateral Ligament Complete Tear Mar 2005; Right  MRI Knee Medial Meniscus Tear Mar 2005; Right  History of Reactive Airway Disease (493.90).  Vital Signs   Recorded by ROSA,JODIE on 18 Nov 2010 03:51 PM  BP:140/82,  LUE,  Sitting,   HR: 76 b/min,  L Radial, Regular,   Weight: 193 lb.  Physical Exam   GENERAL:  Pleasant 34 year old white male fatigued appearing but in no   acute distress  EARS:  Normal left tympanic membrane except for some multiple sclerosis.    Right tympanic membrane appears normal but there is some crusted blood in   the external canal secondary to a previous transtympanic injection of   steroids.  He does have decreased hearing on the right.  NEURO: Cranial nerves 2-12 intact except for decreased hearing on the   right.  Normal strength and sensation in his arms and legs.     Assessment/Plan:     1.  Headaches: Possibly migrainous.  He does have a family history of   migraine headaches.  Unclear if these headaches are related to his hearing   loss or if his hearing loss is triggering headaches.  CT/MRI of the brain   did not show any tumors.  Patient did have a lumbar myelogram 06/18/10 and   unclear if this has contributed.  Patient does have some Vicodin at home he   will try.  He will also go back to Dr. Reinaldo Meeker.  He is supposed to start a   prednisone taper as above for  his hearing loss which may also help his   headaches.  2.  Right hearing loss/tinnitus: Sudden hearing loss syndrome vs Meni?res   unfortunately, patient stopped his Dyazide because he thinks it may have   contributed to his headache.  I told him if he wants to hold it for a few   days and then resume it, that the only way to tell.  He is currently seeing   Dr. Delrae Alfred and Dr. Ardyth Man.  Prednisone taper as above.     RETURN: 11/26/10 HTN.  Signature   Electronically signed by: [name]  M.D.; 11/18/2010 5:50 PM EST.

## 2010-11-20 DIAGNOSIS — I10 Essential (primary) hypertension: Secondary | ICD-10-CM | POA: Insufficient documentation

## 2010-11-20 DIAGNOSIS — H9319 Tinnitus, unspecified ear: Secondary | ICD-10-CM | POA: Insufficient documentation

## 2010-11-26 ENCOUNTER — Ambulatory Visit: Payer: Self-pay | Admitting: Primary Care

## 2010-11-26 NOTE — Progress Notes (Signed)
 Reason For Visit   HTN/HA/Right hearing loss.  HPI   HYPERTENSION MANAGEMENT: [name] is here for hypertension   follow-up.  SYMPTOMS: Headache    PATIENT DENIES: Chest pain,  Palpitations, Shortness of Breath   HABITS:   --Patient has been following a reduced sodium diet.  --He is not getting adequate exercise in 3-4 months   --Smoking: No.   --Caffeine Use:  No     HOME BLOOD PRESSURE: Does not take.    MEDICATIONS: None         He saw Dr Reinaldo Meeker 1 week ago for his headaches/right hearing loss.  Patient   had a normal head CT/MRI.  He was unsure what the cause of the HA were   from.  He referred him to Dr Loreta Ave who he sees 12/10/09.  Dr Reinaldo Meeker gave him   some Klonopin 0.5mg  hs prn which is helping him sleep.  Patient did speak   with Dr Reinaldo Meeker about if lumbar myelogram 06/2010 could be contributing and   he felt it was not.  His HA are not as intense or as long.  He started a   Prednisonr taper  11/22/10 from Dr Delrae Alfred for his right hearing loss-60mg    a day and decrease by 5mg  every 3 days.  No change in his hearing.  No side   effects yet.  Sees Dr Delrae Alfred in 2 days for another audiogram.  Still has   tinnitus. No vertigo.  He has seen a Homeopath who gave him a "natural   diuretic" (Bioeretic).  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  CBC/CRP/ESR were normal.  Allergies   Latex-asked/denied  No Known Drug Allergy.  Current Meds   Multivitamins Tablet;TAKE 1 TABLET DAILY.; RPT  Vitamin D 1000 UNIT Tablet;TAKE 1 TABLET DAILY.; RPT  KlonoPIN 0.5 MG Tablet;TAKE 1 TABLET BEDTIME PRN; RPT.  Active Problems   Allergic Rhinitis (477.9); h/o allergy shots  Attention-deficit / Hyperactivity Disorder (314.01)  Fatigue (780.79)  Hearing Loss Oct 2011; Right (389.9)  Herniated Lumbar Disc (722.10); L4-L5  Hypertension (401.9)  Severe Lumbar Canal Stenosis (724.02); L4-L5  MRI Knee Medial Collateral Ligament Complete Tear Mar 2005; Right  MRI Knee  Medial Meniscus Tear Mar 2005; Right  History of Reactive Airway Disease (493.90)  Ringing In The Ears (Tinnitus) (388.30).  Vital Signs   Recorded by ROSA,JODIE on 26 Nov 2010 07:30 AM  BP:138/82,  LUE,  Sitting,   HR: 72 b/min,  L Radial, Regular,   Weight: 192 lb.  Recorded by rpietropaoli on 26 Nov 2010 07:56 AM  BP:110/80.  Physical Exam   HEARING:  Decreased on the right side.  Normal on the left side.  HEART: Regular rate and rhythm, no murmurs gallops or rubs.  LUNGS: Clear to auscultation bilaterally.     Assessment/Plan:     1.  HTN: Currently normal and that goal of less than 140/90.  It has been   labile lately most likely secondary to stress from his headaches/right   hearing loss. He is on no medicine.  Diet and exercise reviewed.  2.  Headaches: Unclear cause.  They have lessened since last visit.    Imaging of the brain was unremarkable.  There is a family history of   migraines.  Patient will see Dr. Loreta Ave on 12/10/09.  3.  Right hearing loss/tinnitus: Most likely secondary to right sided   hearing loss  syndrome.  He is on a slow prednisone taper and will followup   with Dr. Delrae Alfred from time to time for audiograms.     RETURN: 3 months HTN.  Signature   Electronically signed by: [name]  M.D.; 11/26/2010 8:03 AM EST.

## 2010-12-15 NOTE — Procedures (Signed)
PROCEDURE NOTE:    Date: 07/01/10  Patient: [name], [name]  Record Number: [id number]  Physician: Orbie Hurst. Allena Katz, MD  Side / Level(s): Left L4  Procedure: Therapeutic lumbar transforaminal selective nerve root  injection  Reason for procedure: Lumbar Radiculitis (724.4)    Procedure: The purpose and details of the procedure as well as the risks,  benefits and potential complications were explained to the patient.  Verbal  and written consent was obtained.  Cycled one minute, blood pressure,  pulse, and mean arterial pressure were monitored. Multiple determinations  of oxygen saturation with pulse oximetry was monitored and recorded.    The patient was positioned prone on the fluoroscopy table and prepped in  the usual sterile manner.  A skin wheal was raised with 1% Xylocaine in the  paraspinal region overlying the targeted L4 pedicle.  Using a single needle  technique, and utilizing an oblique view, a 20 gauge 3-1/2" needle was  advanced to the six o'clock position of the L4 pedicle.  Proper needle  placement was confirmed utilizing an AP view. 1.0 cc of Omnipaque was  infused under fluoroscopic visualization.  The exiting L4 nerve root was  clearly outlined and epidural flow was appreciated in a medial and cephalad  fashion beneath the pedicle.  Then, a combination of 1.5 cc of Celestone (6  mg/ml) and 1 cc of 1% of lidocaine was injected within the nerve root  sleeve.    The patient was monitored in the recovery area for adverse allergic,  paralytic and hypertensive reactions.  The patient was discharged without  complication.  Post-procedure instructions were given to the patient.    Maral Lampe K. Allena Katz, MD  Associate Professor, Dept. of Orthopaedics, Spine Division  Associate Professor, Dept. of Physical Medicine and Rehabilitation  [facility] [employer] [facility]                      Electronically Signed and Finalized  by  Hermenia Bers, MD 07/02/2010 14:22  ___________________________________________  Hermenia Bers, MD      DD:   07/01/2010  DT:   07/02/2010 10:14 A  EA/VW#[id number]      cc:   Cheri Fowler, MD

## 2010-12-15 NOTE — Letter (Signed)
June 19, 2010    Anna Genre, MD  [address]  [address]  [place], [place]  [address]      RE:   Kenzel, Azucena  DOB:  September 18, 1976  Unit#: [id number]    Dear Dr. Delene Ruffini:    Thank you very kindly for sending Mr. Ginger to [facility] today.  I had the pleasure of seeing and examining him today  with Dr. Mliss Sax in consultation.  His visit was soon transitioned to  an intervention for left transforaminal L4-L5.    As you know, he is a 35 year old gentleman who 12 years ago had a spinal  fusion after a soccer injury.  Approximate in December of 2010, he was  moving snow.  He had another back injury, rehabilitated.  It did well up  until 3 or 4 weeks ago when he was moving a chair.  His pain has been  progressively worse and within the last week he has had severe left leg  pain and numbness.  His present pain intensity is 4/10 with a range of  10/10.  He endorses modest relief from 5 mg q.8h., Dilaudid 8 mg q.4h. and  gabapentin 300 mg q.h.s., which was not prescribed by this office.  He  completed his methylprednisolone pack yesterday.  His pain is described as  sharp, dull, radiating, shooting.  It is always present and has increased  over time.  The pain is aggravated by sitting, standing, bending, bowel  movements, and use of legs.  Alleviating factors are medications and lying  down.  No other medications.    ALLERGIES:  He denies medication, latex, or food allergies.    PAST MEDICAL HISTORY:  Partial torn meniscus on the right.  Sprained ACL  joint on the right.  Neither required surgical intervention.  Lumbar fusion  as previously described.    FAMILY HISTORY:  Family history of heart disease.  Grandfather with alcohol  abuse.    PERSONAL HISTORY:  He is married.  He lives with his wife and his 68-month  old daughter.  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  He drinks on occasion.  Denies  smoking tobacco products.  He denies use of recreational drugs.  He drinks  a pot of coffee a day.  He was attempting exercising and was training for  an Ironman Marathon.  He was jogging 25 miles a week and biking 7 to 15  miles a week.  He has attempted to do that through his pain as well and has  discontinued over the last week.    REVIEW OF SYSTEMS:  A 10+ review of systems was reviewed.  Pain as  previously described.  Difficulty sleeping, secondary to pain and low grade  depression, secondary to the pain.  He has not had depression in the past.  Previous interventions for pain have been a chiropractor, physical therapy  and a TENS unit, which he last obtained in December of 2010, which provided  him some relief.  The review of systems was with Dr. Mliss Sax.    IMAGING:  Lumbar myelogram reveals severe central canal stenosis with  bilateral neural foraminal stenosis, moderate on the right and severe on  the left.  There is a large disc herniation with extrusion along the  posterior aspect of the L4  and near complete compression of the thecal sac  at L4.  Associated stable Grade I retrolisthesis at L4 and L5 and  correlates with a large compressing defect along the left side of the  thecal sac at L4.  He has an L5-S1 posterior fusion hardware with bilateral  pedicle screws in L5 and S1.  These are in good position and no evidence of  loosening.  He has moderate neural foraminal stenosis.  Mild stable grade I  retrolisthesis at L5-S1 as well.    PHYSICAL EXAMINATION:  On physical exam, this was very abbreviated as he  had just had a physical exam with Sonia Side and was urgently ushered to  our office for lumbar epidural steroid injections.  He is awake, alert and  oriented in all domains.  No significant opioid sedation or withdrawal.  Affect is broad and stable.  His lumbar region was tender.  He has full  strength, decreased sensory in the L4-L5 distribution  and unable to  ascertain reflexes on the left.  The exam was with Dr. Mliss Sax.    ASSESSMENT:  This is a 35 year old gentleman post fusion, laminectomy  syndrome with severe disc bulge, canal narrowing and pain radiating to  ankle level.    PLAN:  As he is scheduled for surgery on Wednesday, and as stated earlier  this visit was transitioned to an urgent intervention and underwent  epidural steroid injections, which we hope will help with the inflammation  and pain.    Thank you very kindly for the opportunity to participate in your patient's  care.  Should you have questions or concerns, please feel free to contact  the office.    The plan was formulated and discussed with Dr. Mliss Sax.  Sincerely,  Dictated by:  Joellyn Haff, NP  Electronically Reviewed and Signed by  Joellyn Haff, NP 06/25/2010 13:08    I saw and evaluated the patient.  I agree with the resident's/fellow's  findings and plan of care as documented above.    Electronically Signed and Finalized by  Elenora Gamma, MD 06/26/2010 11:47  ____________________________________  Elenora Gamma, MD        DD:   06/19/2010  DT:   06/19/2010  5:54 P  DVI:  [id number]  WU/JWJ#[id number]    cc:   Cheri Fowler, MD        Anna Genre, MD        Juliann Pares, NP

## 2010-12-15 NOTE — Procedures (Signed)
PROCEDURE NOTE:    Date: 07/01/10  Patient: [name], [name]  Record Number: [id number]  Physician: Orbie Hurst. Allena Katz, MD  Side / Level(s): Left L5  Procedure: Therapeutic lumbar transforaminal selective nerve root  injection  Reason for procedure: Lumbar Radiculitis (724.4)    Procedure: The purpose and details of the procedure as well as the risks,  benefits and potential complications were explained to the patient.  Verbal  and written consent was obtained.  Cycled one minute, blood pressure,  pulse, and mean arterial pressure were monitored. Multiple determinations  of oxygen saturation with pulse oximetry was monitored and recorded.    The patient was positioned prone on the fluoroscopy table and prepped in  the usual sterile manner.  A skin wheal was raised with 1% Xylocaine in the  paraspinal region overlying the targeted L5 pedicle.  Using a single needle  technique, and utilizing an oblique view, a 20 gauge 3-1/2" needle was  advanced to the six o'clock position of the L5 pedicle.  Proper needle  placement was confirmed utilizing an AP view. 1.0 cc of Omnipaque was  infused under fluoroscopic visualization.  The exiting L5 nerve root was  clearly outlined and epidural flow was appreciated in a medial and cephalad  fashion beneath the pedicle.  Then, a combination of 1.5 cc of Celestone (6  mg/ml) and 1 cc of 1% of lidocaine was injected within the nerve root  sleeve.    The patient was monitored in the recovery area for adverse allergic,  paralytic and hypertensive reactions.  The patient was discharged without  complication.  Post-procedure instructions were given to the patient.    Chrstopher Malenfant K. Allena Katz, MD  Associate Professor, Dept. of Orthopaedics, Spine Division  Associate Professor, Dept. of Physical Medicine and Rehabilitation  [facility] [employer] [facility]                      Electronically Signed and Finalized  by  Hermenia Bers, MD 07/02/2010 14:22  ___________________________________________  Hermenia Bers, MD      DD:   07/01/2010  DT:   07/02/2010 10:15 A  JW/JX#[id number]      cc:   Cheri Fowler, MD

## 2010-12-15 NOTE — Procedures (Addendum)
Vital Signs   Recorded by nfernandez on 19 Jun 2010 03:43 PM  BP:148/93,   HR: 61 b/min,   Resp: 17 r/min,   O2 Sat: 100 (%SpO2),  RA.  AmendedVirgia Land ; 06/19/2010 3:43 PM   EST.  Procedure   [facility]  Procedure Note  [facility] [place] Department of Neurosurgery  [address] [place], [place]  [address]  Phone: [phone]  Fax: [phone]     Patient Name: [name]  Medical Record Number: [id number]  Date of Procedure: 06/19/2010  Location of Procedure: [facility] / [address] [facility]     Procedure Name:  Transforaminal Lumbar Epidural Steroid Injection Left L4   and L5  Provider: Darius Bump MD  Indication: Lumbar Stenosis, Central Canal and Lateral Recess  ICD9: 724.4722.83;724.02  CPT:  [phone]      The procedure was reviewed with the patient in detail. Informed consent was   obtained after discussing the benefits and risks of the procedure   including: bleeding, infection, nerve injury, paralysis, failure to relieve   pain, increased pain, and headache. The patient did not report any   significant change in his baseline pattern of pain since the last   evaluation.     The patient was brought to the procedure room. Appropriate   cardiorespiratory monitoring was initiated in accordance with [facility] protocol. The patient was placed in the prone position on   the procedure table. Under fluoroscopy, the previously- marked side of   painful symptoms was confirmed and the injection site marked after a second   timeout per protocol. The procedure site was triple-prepped with betadine   and draped in meticulous sterile fashion with betadine.       With fluoroscopic imaging, an oblique view was obtained, with the final   position of the pedicle of the superior vertebra aligned with the superior   articular process of the inferior vertebra. The skin and subcutaneous   tissue overlying the the 6  o'clock position of the  foramina at the Left L4   and L5 level. A 22-gauge 4 inch spinal needle was gradually advanced under   co-axial, intermittent fluoroscopic guidance. In the AP view the bevel was   advanced to the midpoint of the foramina at the corresponding level.   Confirmation of needle depth along the aspect of the neuroforamina was   confirmed in the lateral view of fluoroscopy.  After negative aspiration   for blood and cerebrospinal fluid, injection of 1ml of omnipaque contrast   was injected at each level. There was clear delineation of the nerve root   sleeve with medial spreading atL 4 and  L5 after medial repositioning. A   total of 40mg  of kenalog,1cc of 0.25 percent bupivicaine and 1cc normal   saline were injected slowly at each level. The patient tolerated the   procedure well.      The patient returned to the recovery area and was monitored in accordance   with [facility] protocol. The patient was discharged in his   baseline neurological state with a copy of standard discharge instructions.   The patient will follow up in 4 weeks' time.     Darius Bump MD.  Signature   Electronically signed by: Darius Bump  M.D.; 06/19/2010 3:35 PM   EST.

## 2010-12-15 NOTE — Miscellaneous (Unsigned)
 Continuity of Care Record  Created: todo  From: [name]  From:   From: [employer], EHR v10.2.7.53  To: LAKSHYA, MCGILLICUDDY  Purpose: Patient Use;       Problems  Diagnosis: Allergic Rhinitis (477.9)   Diagnosis: Attention-deficit / Hyperactivity Disorder (314.01)   Diagnosis: Fatigue (780.79)   Diagnosis: Herniated Lumbar Disc (722.10)   Diagnosis: Hypertension (401.9)   Diagnosis: Severe Lumbar Canal Stenosis (724.02)   Diagnosis: History of Reactive Airway Disease (493.90)   Diagnosis: Ringing In The Ears (Tinnitus) (388.30)   Problem: History of Lumbar Vertebral Fusion Jan 1998  Problem: MRI Knee Medial Collateral Ligament Complete Tear Mar 2005; Right  Problem: MRI Knee Medial Meniscus Tear Mar 2005; Right  Diagnosis: Hearing Loss Oct 2011; Right (389.9)     Family History  Family history of Family Health Status    Social History  Alcohol Use  Marital History - Currently Married  Occupation:  Previous History Of Smoking  Teacher, adult education - [employer]  Using Marijuana    Alerts  Allergy - Latex-asked/denied   Allergy - No Known Drug Allergy     Medications  KlonoPIN 0.5 MG Tablet; TAKE 1 TABLET BEDTIME PRN ; RPT   Multivitamins Tablet; TAKE 1 TABLET DAILY. ; RPT   Vitamin D 1000 UNIT Tablet; TAKE 1 TABLET DAILY. ; RPT     Immunizations  MMR   MMR   Hepatitis B   Hepatitis B   Hepatitis B   * PPD   DT   Influenza   Influenza   Tdap (Adacel)

## 2011-02-10 ENCOUNTER — Ambulatory Visit: Payer: Self-pay | Admitting: Otolaryngology

## 2011-02-10 ENCOUNTER — Ambulatory Visit: Payer: Self-pay | Admitting: Primary Care

## 2011-02-10 ENCOUNTER — Ambulatory Visit: Payer: Self-pay | Admitting: Neurosurgery

## 2011-02-10 ENCOUNTER — Encounter: Payer: Self-pay | Admitting: Otolaryngology

## 2011-02-10 ENCOUNTER — Ambulatory Visit: Payer: Self-pay

## 2011-02-10 ENCOUNTER — Encounter: Payer: Self-pay | Admitting: Primary Care

## 2011-02-10 NOTE — Progress Notes (Signed)
 Reason For Visit   HTN/Lumbar pain/Right hearing loss.  HPI   HYPERTENSION MANAGEMENT: [name] is here for hypertension   follow-up.  SYMPTOMS:   Other: right hearing loss persists with tinnitus   PATIENT DENIES: Headache (none in a month), Chest pain,  Palpitations   HABITS:   --Patient has been following a reduced sodium diet.  --He is not getting adequate exercise due to lumbar pain/left   sciatica-sitting is the most difficult.  He has a herniated disc L4-L5 and   an old fusion L5-S1.  No weakness/incontinence.  Not using any pain meds.   He is doing PT.  Tries to avoid lifting at work but does lift his 54 month   old daughter which can bother him  --Smoking: No.   --Caffeine Use:  Yes.  cups per day:  2   HOME BLOOD PRESSURE: Does not take.    MEDICATIONS: None      ** Medication reconciliation completed and updated.  Allergies   Latex-asked/denied  No Known Drug Allergy.  Current Meds   Multivitamins Tablet;TAKE 1 TABLET DAILY.; RPT  Vitamin D 1000 UNIT Tablet;TAKE 1 TABLET DAILY.; RPT  KlonoPIN 0.5 MG Tablet;TAKE 1 TABLET BEDTIME PRN; RPT.  Active Problems   Allergic Rhinitis (477.9); h/o allergy shots  Attention-deficit / Hyperactivity Disorder (314.01)  Fatigue (780.79)  Hearing Loss Oct 2011; Right (389.9)  Herniated Lumbar Disc (722.10); L4-L5  Hypertension (401.9)  Severe Lumbar Canal Stenosis (724.02); L4-L5  MRI Knee Medial Collateral Ligament Complete Tear Mar 2005; Right  MRI Knee Medial Meniscus Tear Mar 2005; Right  History of Reactive Airway Disease (493.90)  Ringing In The Ears (Tinnitus) (388.30).  Vital Signs   Recorded by [facility] on 10 Feb 2011 07:30 AM  BP:108/72,  LUE,  Sitting,   HR: 60 b/min,  L Radial, Regular,   Weight: 197.6 lb.  Recorded by rpietropaoli on 10 Feb 2011 07:50 AM  BP:116/78.  Physical Exam   EARS:  decreased whisper in his right ear.  Normal hearing left ear.  HEART: Regular rate and rhythm, no murmurs gallops or rubs.  LUNGS: Clear to auscultation  bilaterally.  BACK:  no palpable tenderness over his lumbar spine.  NEURO:  normal strength/pinprick in both legs.     Assessment/Plan:     1.HTN: Presently stable on no medicines.  Currently at goal of less than   140/90.  Diet reviewed.  Unable to exercise because of his lumbar herniated   disc.  2.  Right hearing loss/tinnitus: Secondary to sudden hearing loss in the   room.  Patient does have very minimal hearing on his right.  He will see   Dr. Delrae Alfred next month to discuss hearing aids.  3.  Herniated disc L4-L5/left lumbar radiculopathy: Patient has seen Dr.   Isabell Jarvis and Dr. Renae Fickle.  The proposed treatment would be a fusion at   L4-L5.  Patient already has a fusion at L5-S1 and reluctant to have surgery   again.  He will be seeing Dr. Delene Ruffini today for a third opinion.  In the   meantime, he plans on starting to swim at the [facility].  Continue physical   therapy.  Avoid triggering activity/postures.     RETURN: 4 months HTN.  Signature   Electronically signed by: [name]  M.D.; 02/10/2011 7:55 AM EST.

## 2011-02-10 NOTE — Letter (Signed)
 February 10, 2011    Cheri Fowler, MD  [address] [place], [place]  [address]      RE:   [name], [name]  DOB:  Apr 15, 1976  Unit#: [id number]    Dear Dr. Einar Gip:    I have seen [name] in follow-up.  He is again a gentleman with a  previous L5-S1 fusion who developed a large ruptured disc at L4-L5.  He has  treated this conservatively with injections and physical therapy.  Initially, he did well, but has had recurrence of pain, which is quite  severe right now.  I spent a considerable amount of time counseling Mr.  Hartman about the implications.  He is very concerned about the possible  need for future fusions.  He has apparently seen 2 other spine surgeons,  who have both recommended a fusion at L4-L5.  My recommendation, prior to  any consideration of intervention, I would like to obtain a follow-up CT  myelogram to define the anatomy as it exists now as his previous myelogram  was in July of 2011.  Especially given the fact that [name] really has  purely radicular symptoms, I would lean more heavily towards just a simple  discectomy at this time.  Given the fact this is a large free fragment, I  do not think this would increase the risk and need a fusion at L4-L5.  Certainly, there is a significant chance he will need a fusion down the  line at L4-L5, given the biomechanical stresses this level is undergoing  due to the previous fusion below, but certainly a simple discectomy would  not weaken the spine significantly or increase the need for a future fusion  or make a future fusion more difficult in my hands.    We will get him scheduled for a CT myelogram and see him back after.    Sincerely,              Electronically Signed and Finalized by  Anna Genre, MD 02/23/2011 09:56  ____________________________________  Anna Genre, MD        DD:   02/10/2011  DT:   02/10/2011  3:29 P  DVI:  [id number]  RER/[id number]    cc:   Cheri Fowler, MD

## 2011-02-12 ENCOUNTER — Other Ambulatory Visit: Payer: Self-pay | Admitting: Neurosurgery

## 2011-02-12 DIAGNOSIS — M5126 Other intervertebral disc displacement, lumbar region: Secondary | ICD-10-CM

## 2011-03-03 ENCOUNTER — Ambulatory Visit: Payer: Self-pay | Admitting: Neurosurgery

## 2011-03-03 ENCOUNTER — Ambulatory Visit
Admit: 2011-03-03 | Discharge: 2011-03-03 | Disposition: A | Discharge status: Home / Self Care | Payer: Self-pay | Source: Ambulatory Visit | Attending: Neurosurgery | Admitting: Neurosurgery

## 2011-03-05 ENCOUNTER — Ambulatory Visit: Payer: Self-pay | Admitting: Neurosurgery

## 2011-03-05 NOTE — Letter (Signed)
 March 05, 2011    Cheri Fowler, MD  [address] [place], [place]  [address]    RE:   Carlos, Hartman  DOB:  30-Sep-1976  Unit#: [id number]    Dear Dr. Einar Gip:    I have just seen [name] in follow-up with his new myelogram.  Comparing  it to his previous myelogram in July of 2011, he has had near complete  resorption of his very large herniated disk.  Again, noted is his loss of  disk space height and small amount of retrolisthesis by approximately 2 mm  of L4 and L5.  It does not appear to move significantly on  flexion/extension.    Assessment: I spent a considerable amount of time, approximately 45  minutes, discussing with [name] the implications of these findings.  Certainly, his residual leg pain would not be due to overt compression from  a disk as this has resorbed completely and is more due to microinstability  and intermittent compression.  I certainly favor aggressive core  strengthening in this situation in this young gentleman prior to any  consideration of surgical intervention. I certainly think should he need  surgical intervention, it would be a fusion.  I think if it needs to be  performed, it can be done in a minimally invasive manner which allows [place] to  rapidly fixate his L4-l5 segment without having to deal with the previous  scar tissue from the previous surgery or alter the instrumentation.  If he  should need surgery it would be an L4-L5 anterior lumbar interbody fusion  with facet screws.  This would allow [place] again to avoid previously  instrumentation at surgery.  I explained all of these issues and the risks,  benefits, and alternatives to this procedure as well as my strong  recommendation to try aggressive core strengthening as the first resort for  at least 3 to 6 months.  He understands all of these recommendations and he  will undergo physical therapy.      Sincerely,              Electronically Signed and Finalized by  Anna Genre, MD  03/09/2011 12:15  ____________________________________  Anna Genre, MD        DD:   03/05/2011  DT:   03/05/2011 11:55 A  DVI:  [id number]  RER/RP2#[id number]    cc:   Cheri Fowler, MD

## 2011-04-15 NOTE — Progress Notes (Signed)
 Otolaryngology   February 10, 2011        Dear Dr. [name]     [name] was seen on February 10, 2011.  Subjectively, he is not   aware of any significant change in his hearing.  He tried the diuretics for   1 1/2 months and did not feel that there was any change.  He, therefore,   discontinue the diuretic.  There has been no infection, pain, drainage or   bleeding involving either ear.  He denies dizziness or vertigo.  He   continued to complain of the tinnitus in the right ear.  This is more   bothersome than the hearing loss.  Current medications include Klonopin   p.r.n., multivitamins and vitamin D.     He is in no acute distress.  External auditory canals are clear.  The   tympanic membranes are intact, clear and mobile.  There is mild scarring of   the tympanic membranes, but this is unremarkable otherwise.  The nose and   throat are clear without inflammation or drainage.  The neck is benign   without adenopathy or masses.  The remainder of his ENT exam is   unremarkable.  His audiogram today demonstrates the same mild to moderate   hearing loss in the right ear, greater in the low frequencies in the high   frequencies.  In fact, there is been a slight increase in the hearing loss   and some of the mid frequencies.  His discrimination score is 92% on the   right and 100% on the left.  His hearing in the left ear is perfectly   normal across all frequencies.     The status of the exam of the review of the audiogram have been reviewed   with [name].  We have discussed the pros and cons of amplification.  He   is giving some consideration.  Otherwise, we will have him return for   follow-up in one year or on a p.r.n. basis.                 Sincerely,        Trina Ao. Dola Factor., MD  Associate Professor and Associate Chairman  Department of Otolaryngology/Director, [facility] [place]     Dictated using Chemical engineer.  Signature   Electronically signed by: Bettey Costa  MD Attend.; 04/15/2011 7:19 AM EST.

## 2011-06-13 ENCOUNTER — Encounter: Payer: Self-pay | Admitting: Primary Care

## 2011-06-13 DIAGNOSIS — J45909 Unspecified asthma, uncomplicated: Secondary | ICD-10-CM | POA: Insufficient documentation

## 2011-06-16 ENCOUNTER — Ambulatory Visit: Payer: Self-pay | Admitting: Primary Care

## 2011-06-16 ENCOUNTER — Encounter: Payer: Self-pay | Admitting: Primary Care

## 2011-06-16 ENCOUNTER — Encounter: Payer: Self-pay | Admitting: Gastroenterology

## 2011-06-24 ENCOUNTER — Encounter: Payer: Self-pay | Admitting: Gastroenterology

## 2011-08-21 ENCOUNTER — Ambulatory Visit: Payer: Self-pay | Admitting: Primary Care

## 2011-08-21 ENCOUNTER — Encounter: Payer: Self-pay | Admitting: Primary Care

## 2011-08-21 VITALS — BP 120/74 | HR 72 | Ht 69.0 in | Wt 195.0 lb

## 2011-08-21 DIAGNOSIS — B354 Tinea corporis: Secondary | ICD-10-CM

## 2011-08-21 DIAGNOSIS — I10 Essential (primary) hypertension: Secondary | ICD-10-CM

## 2011-08-21 NOTE — Progress Notes (Signed)
Chief Complaint   Patient presents with   . Hypertension    HYPERTENSION MANAGEMENT:     BP 120/74  Pulse 72  Ht 1.753 m (5\' 9" )  Wt 88.451 kg (195 lb)  BMI 28.80 kg/m2        PATIENT DENIES: HA/CP/SOB/palpitations    HABITS:   --Sodium-  --Exercise- sporadic due to lumbar pain-he is getting an outside opinion for his back at the [facility] next week regarding possible surgery  --Smoking-no  --Caffeine Use- 2-4  16 oz cups of coffee/d    HOME BLOOD PRESSURE: not taking      He also has had a chronic rash on chest ~5 years.  I did notice it at his 6/08 CME.  He never tried [employer].  He now has another area left breast.    EXAM:     HEART: Regular rate and rhythm, no murmurs gallops or rubs.  LUNGS: Clear to auscultation bilaterally.  CHEST:  There are 2 rough-colored macules on his chest, one around each breast.    Assessment/Plan:    1. HTN: Excellent control on no medicine.  Diet and exercise reviewed.  He is currently at goal of less than 140/90.  2.  Probable tinea corporis chest: Patient will try Lamisil cream twice a day.  Call if no better/worse  3.  Health maintenance: Patient declined a flu shot.  He has some concern it may cause hearing loss since he had a flu shot last year and shortly thereafter had sudden hearing loss    RETURN: 3 months HTN

## 2011-09-01 ENCOUNTER — Encounter: Payer: Self-pay | Admitting: Primary Care

## 2011-09-01 ENCOUNTER — Ambulatory Visit: Payer: Self-pay | Admitting: Primary Care

## 2011-09-01 VITALS — BP 118/80 | HR 68 | Wt 196.0 lb

## 2011-09-01 DIAGNOSIS — H9319 Tinnitus, unspecified ear: Secondary | ICD-10-CM

## 2011-09-01 DIAGNOSIS — R51 Headache: Secondary | ICD-10-CM

## 2011-09-01 DIAGNOSIS — F439 Reaction to severe stress, unspecified: Secondary | ICD-10-CM

## 2011-09-01 MED ORDER — ESCITALOPRAM OXALATE 10 MG PO TABS *I*
10.0000 mg | ORAL_TABLET | Freq: Every day | ORAL | Status: DC
Start: 2011-09-01 — End: 2011-09-30

## 2011-09-01 NOTE — Progress Notes (Signed)
CC: Right Ear Fullness, Headache & Stress     Filed Vitals:    09/01/11 1304   BP: 176/96   Pulse: 68   Weight: 88.905 kg (196 lb)     Filed Vitals:    09/01/11 1321   BP: 118/80   Pulse:    Weight:          On 08/27/11, he had a lumbar steroid injection for his chronic lumbar pain at the [facility]. The next day, he started having fullness in right ear with chronic loud tinnitus. He doe shave a h/o sudden hearing loss/tinnitus in this year x 1 year.  No recent loud noise exposures.   Has a HA/vertigo/N/V. He has a lot of stress at work, and over his chronic lumbar back issues and he thinks the stress is causing the HA/tinnitus.  He is more irritable. He did take an old Klonopin 0.5mg  9/23 which did not help.     Medications reviewed and  changes made     Exam:    Affect: Pleasant and cheerful in minimal distress  Ears: Normal tympanic membranes bilaterally.  Mild decrease hearing to whisper on the right.      Assessment/Plan:    1.  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  It has minimally improved over the past year.  Unclear why he has a sudden flareup.  I recommended he call his ENT-Dr. Delrae Alfred to discuss the situation.  He is not interested in more steroid medication at this time.  Patient feels stress may be contributing.  2.  Headache/stress: Patient is asking for medication that'll help decrease his stress.  He feels his headaches are from stress.  He has used Klonopin in the past before bedtime.  He does have some left and I recommended he can use this before bedtime as well as in the morning as needed for the next week.  We also talked about other medicines such as Cymbalta/SSRIs.  Given his chronic lumbar pain, he may benefit from Cymbalta.  He prefers to try Lexapro first.  A prescription for 10 mg daily was given-side effects reviewed.  I also reviewed with him relaxation techniques and discussed possible counseling.  He  wants to hold off on counseling at this time.  I recommended he try to find more time away from the [facility] if possible.  Call if no better/worse    RETURN: 2 weeks stress/headaches  & 11/2011 HTN

## 2011-09-11 ENCOUNTER — Telehealth: Payer: Self-pay | Admitting: Primary Care

## 2011-09-11 NOTE — Telephone Encounter (Signed)
At last appointment was advised to try klonopin for his anxiety.  He had some at home left from a previous rx.  Has 5 pills left and wants to know if he should wean himself or can he just stop it?  Please advise, thanks.

## 2011-09-11 NOTE — Telephone Encounter (Signed)
Attempted to reach patient x 2 - no answer.  Will defer to your recommendations on Monday.

## 2011-09-12 NOTE — Telephone Encounter (Signed)
Spoke with patient-OK to stop Klonopin, no weaning needed

## 2011-09-14 ENCOUNTER — Encounter: Payer: Self-pay | Admitting: Gastroenterology

## 2011-09-21 ENCOUNTER — Encounter: Payer: Self-pay | Admitting: Gastroenterology

## 2011-09-21 ENCOUNTER — Ambulatory Visit: Payer: Self-pay | Admitting: Primary Care

## 2011-09-25 ENCOUNTER — Telehealth: Payer: Self-pay | Admitting: Primary Care

## 2011-09-25 NOTE — Telephone Encounter (Signed)
Call from patients wife.   Patient is having an episode of vertigo.  Has had this before, though it has been about a year since the last time.  Is home in bed, feels a little better laying down.  Is very nauseas, 0 vomiting.  He was at work, bent over to pick something up and had immediate vertigo.  Wife will get dramamine for him and will contact the office if things become worse or do not improve.

## 2011-09-30 ENCOUNTER — Ambulatory Visit: Payer: Self-pay | Admitting: Primary Care

## 2011-09-30 ENCOUNTER — Encounter: Payer: Self-pay | Admitting: Primary Care

## 2011-09-30 DIAGNOSIS — Z566 Other physical and mental strain related to work: Secondary | ICD-10-CM

## 2011-09-30 DIAGNOSIS — R51 Headache: Secondary | ICD-10-CM

## 2011-09-30 MED ORDER — ESCITALOPRAM OXALATE 20 MG PO TABS *I*
20.0000 mg | ORAL_TABLET | Freq: Every day | ORAL | Status: DC
Start: 2011-09-30 — End: 2011-11-04

## 2011-09-30 NOTE — Progress Notes (Signed)
Chief Complaint   Patient presents with   . Headache   . Stress      Filed Vitals:    09/30/11 0801   BP: 114/78   Pulse: 68   Weight: 88.451 kg (195 lb)      He started Lexapro 10mg  a day 09/01/11 since he has a lot of stress at work and stress over his lumbar back issues. No side effects. Mildly less irritable.  He took Klonopin x 1 week which made him tired for 3-4 days. HA are less often.  He did have a steroid lumbar injection 08/27/11 at the [facility] but the steroid was NOT the recalled batch that caused fungal meningitis (patient called)    EXAM:    GENERAL:  Pleasant 35 year old white male in no acute distress    Assessment/Plan:    1.  Headaches: Patient states they are less often since starting Lexapro.  He does have mildly less irritability as well on the Lexapro.  We did talk about increasing the dose which he wants to do.  A prescription for 20 mg daily was given.  Hopefully this will help lessen some of the stress.  He does try to exercise when possible which helps relieve stress as well.  Call if no better/worse  2.  Health maintenance: Patient declined a flu shot    RETURN: 11/13/11 HTN/stress

## 2011-10-08 ENCOUNTER — Encounter: Payer: Self-pay | Admitting: Gastroenterology

## 2011-10-12 ENCOUNTER — Encounter: Payer: Self-pay | Admitting: Gastroenterology

## 2011-10-16 ENCOUNTER — Telehealth: Payer: Self-pay | Admitting: Primary Care

## 2011-10-16 NOTE — Telephone Encounter (Signed)
Wife calling with increased episodes of vertigo.  Includes vomiting which is worse than previous attacks.  She spoke to ENT and they did prescribe promethazine.  They are still concerned that the lexapro could be causing some of the increase in symptoms.  Explained that it is unlikely and not a side effect of lexapro.  He did have one treatment for the vertigo on Tuesday.  Per Dr Einar Gip he should stop the lexapro for the weekend and see if symptoms persist.  Advised the wife to call me on Monday and let me know how the weekend goes.  He is working this weekend, he needs to find some time to rest and apply relaxation techniques.

## 2011-10-19 ENCOUNTER — Telehealth: Payer: Self-pay | Admitting: Otolaryngology

## 2011-10-19 NOTE — Telephone Encounter (Signed)
SP W/ WIFE SDCHED 11/29

## 2011-10-19 NOTE — Telephone Encounter (Signed)
Patient referred by his PCP to Dr Neil Crouch for dizziness. Possible Meniere's disease  Please call to schedule at:. [phone].

## 2011-10-20 ENCOUNTER — Ambulatory Visit: Payer: Self-pay

## 2011-10-20 ENCOUNTER — Ambulatory Visit
Admit: 2011-10-20 | Discharge: 2011-10-20 | Disposition: A | Discharge status: Home / Self Care | Payer: Self-pay | Source: Ambulatory Visit | Admitting: Neurology

## 2011-10-20 ENCOUNTER — Encounter: Payer: Self-pay | Admitting: Gastroenterology

## 2011-10-26 ENCOUNTER — Encounter: Payer: Self-pay | Admitting: Orthopedic Surgery

## 2011-10-26 ENCOUNTER — Ambulatory Visit: Payer: Self-pay | Admitting: Orthopedic Surgery

## 2011-10-26 VITALS — BP 140/76 | HR 66 | Ht 69.0 in | Wt 190.0 lb

## 2011-10-26 DIAGNOSIS — M545 Low back pain, unspecified: Secondary | ICD-10-CM | POA: Insufficient documentation

## 2011-10-26 MED ORDER — NAPROXEN 500 MG PO TABS *I*
500.0000 mg | ORAL_TABLET | Freq: Two times a day (BID) | ORAL | Status: DC
Start: 2011-10-26 — End: 2011-11-20

## 2011-10-26 NOTE — Progress Notes (Signed)
Subjective:  35 y.o. male returns for follow-up of: Ongoing back pain.  He was last seen some months ago and has had several second opinions.  Major complaint is back pain which limits him from running.  Intermittently there is some right leg pain.    Patient report  Treatment since last Visit: Patient has been to the [facility] where he had an epidural injection which helped with leg pain.  Patient had a second opinion from neurosurgery.  Treatment to date has been: Partially effective  Current Chief Complaint: Back pain predominantly, limiting activities to running once a week        Interval Health History changes:none  Relevant New Positives on 14 point Spine Related Review of Systems: None   Focused Exam Update:  Light touch and motor are normal    Imaging:  Myelogram performed this summer shows resolution of the disc at L4-L5      Recommendations:  Treatment Options:  We had a lengthy discussion regarding the potential role for surgery.  I am strongly against the idea of pursuing the surgery unless he has maximized conservative care.  He admits he is not currently doing any core strengthening.  I gave him a prescription for a refresher course of active spine rehab.  I also gave him r a prescription for some nonsteroidals.    Key Discussion:  We will meet again in 4-6 weeks.  He and his wife are anticipate trying to have one more child.    Follow-up: 4-6 weeks    Orders for Next Visit: None

## 2011-10-26 NOTE — Progress Notes (Signed)
[name] was seen on 10/20/2011 for dizziness, imbalance or related complaints. Below are the relevant portions of my report. The complete version is attached.    SUMMARY OF FINDINGS:    Video-/Electro-Nystagmography (VNG/ENG)   Vestibular Function    Normal (no spontaneous, post headshake, or positional nystagmus; and symmetric caloric responses).  Caloric Tests:  Vertigo during tests; resembled complaint.    Oculomotor Function     Normal (fixation, saccades, smooth pursuit, optokinetic, and eccentric gaze).    Rotatory Test  Normal vestibulo-oculo reflex (VOR) during high frequency head motion.    Computerized Dynamic Posturography (CDP)   Excellent postural stability during stance (Note: this does not necessarily relate to gait control)     Normal vestibular and oculomotor function. (History sheet notes right aural symptoms, and headaches.)

## 2011-11-04 ENCOUNTER — Ambulatory Visit: Payer: Self-pay

## 2011-11-04 ENCOUNTER — Ambulatory Visit: Payer: Self-pay | Admitting: Primary Care

## 2011-11-04 ENCOUNTER — Encounter: Payer: Self-pay | Admitting: Primary Care

## 2011-11-04 VITALS — BP 122/84 | HR 60 | Temp 98.2°F | Ht 69.0 in | Wt 199.0 lb

## 2011-11-04 DIAGNOSIS — F439 Reaction to severe stress, unspecified: Secondary | ICD-10-CM

## 2011-11-04 DIAGNOSIS — Z23 Encounter for immunization: Secondary | ICD-10-CM

## 2011-11-04 DIAGNOSIS — I10 Essential (primary) hypertension: Secondary | ICD-10-CM

## 2011-11-04 DIAGNOSIS — J019 Acute sinusitis, unspecified: Secondary | ICD-10-CM

## 2011-11-04 DIAGNOSIS — M549 Dorsalgia, unspecified: Secondary | ICD-10-CM

## 2011-11-04 MED ORDER — AZITHROMYCIN 250 MG PO TABS *I*
ORAL_TABLET | ORAL | Status: DC
Start: 2011-11-04 — End: 2011-11-20

## 2011-11-04 NOTE — Progress Notes (Signed)
Chief Complaint   Patient presents with   . URI   . Hypertension   . Stress    BP 122/84  Pulse 60  Temp 36.8 C (98.2 F)  Ht 1.753 m (5\' 9" )  Wt 90.266 kg (199 lb)  BMI 29.39 kg/m2  SpO2 100%     ONSET: been sick x 3 weeks.  Daughter is sick      SYMPTOMS:         Cough : dry until 2 days ago coughed up yellow phlegm       Nasal Congestion: yes       Rhinorrhea: yellow green       Fever:  no       Chills:  no       Sore Throat: no         Post Nasal Drip:  yes       Otalgia:  no       Hoarseness: no         Sinus Tenderness: no         Headache:  Had some after giving up caffeine 3 weeks ago.  Has chronic tension headache       Shortness of Breath:  no    RELIEVED BY: took no cold meds    HYPERTENSION MANAGEMENT:      PATIENT DENIES: CP/palpitations    HABITS:   --Sodium- no  --Exercise- elliptical 2x week, sporadic jogging  --Smoking- no  --Caffeine Use- none in 3 weeks    HOME BLOOD PRESSURE: not taking    His work/home stress is the same but he is coping better.  Started Lexapro 10mg  a day 09/30/11 and then increased to 20mg  a day.  He felt dizzy and was not sure if it was the Lexapro or from his ear problems.  He self decreased Lexapro to 10mg  a day 2 weeks ago and cut out caffeine and vertigo nearly resolved.  He wants to stop Lexapro.       EXAM:    HEENT: Normal eyes.  Ears normal. Oropharynx normal. No rhinitis.  NECK: Normal  LUNGS: Clear to auscultation bilaterally  HEART:  RRR, no m/g/r    Assessment/Plan:    1.  Acute sinusitis: A prescription for a Z-Pak was given.  This should also cover a possible bronchitis he is getting.  Robitussin-DM as needed.  Afrin nasal spray x3 days.  Call if no better/worse  2.  HTN: Presently stable and at goal of less than 140/90.  Currently on no medicines.  Diet and exercise reviewed.  3.  Stress/anxiety: Presently stable.  Patient requests going off of Lexapro.  He did have some dizziness possibly from a higher dose Lexapro.  He also has some fatigue which may  or may not be from the Lexapro.  Okay to discontinue Lexapro.  Call if symptoms worsen.  4.  Miscellaneous: Patient is currently taking Naprosyn twice a day for one month for his low back problems.  I recommended he add Prilosec 20 mg daily for GI protection.  He currently has no symptoms from a GI standpoint.  5. Health maintenance: Flu shot given    RETURN: 3 months HTN

## 2011-11-04 NOTE — Addendum Note (Signed)
Addended by: Marcene Corning on: 11/04/2011 04:14 PM     Modules accepted: Orders

## 2011-11-04 NOTE — Progress Notes (Signed)
LUMBAR SPINE EVALUATION   Subjective:  [name] is a 35 y.o. male who is present today for back pain. He reports having a fusion for a spondy 35 yrs ago.  He had a herniated disc above the level of the fusion and has been going to [facility]. He reports he is trying to avoid surgery.  Mechanism of injury/history of symptoms: No specific cause. 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   Posterior spinal fusion L5-S1 with laminectomy at L5. No evidence of   hardware failure. Slightly progressed discogenic disease at L4-L5   with disc space narrowing and small osteophytes. No acute findings.    Imaging: Myelogram performed this summer shows resolution of the disc at L4-L5  Other: NA  Symptom location: Lateral, Peripheral and lower leg.   left  Hip pain with prolonged sitting.  Relevant symptoms: Tingling, Sharp, Pain    Symptom frequency: Intermittent  Symptom intensity (0 - 10 scale): Now 4 Best 3 Worst 7   Night Pain: no      Symptoms worsen with: Sitting, Standing   Symptoms improve with: Rest, lying on the floor, & naproxen   Assistive device:  none  Patient's goals for therapy: Return to sport, Reduce pain, run daily    Objective:  Observation: Patient was able to ambulate into the department with a normal gait pattern.  The patient was able to perform basic bed mobility independent.    Movement Screen: The patient was able to perform heel walking and was able to perform toe walking during the examination.  The patient was able to squat to the floor during the examination.     Palpation:  N/T    ROM Loss    Flexion: moderate loss,  increased left low back pain  Extension:  no loss in motion  Left Sidebend: no loss in motion  Right Sidebend:  no loss in motion  Left Rotation:  no loss in motion  Right Rotation:  no loss in motion  Repeated Movement Testing:  N/A   Neuromuscular Assessment: Sensation:  Light touch, Intact to gross screen, Myotomes:  LE,  Intact to gross screen     Test:     Lumbar:  Straight Leg Raise,  Right LE  negative, Left LE   negative               Cervical Spine:  N/A    Sacroilliac Joint:  N/A                     Flexibility:   Hamstring:  Moderate tightness  Quads:  Moderate tightness  Psoas:  Significant tightness, left greater than right    Comments:  NA  LUMBAR SPINE SCREEN      McGill Stage:  Endurance    Postural correction test:    Sitting Correction: symptoms reduced:  no    Standing Correction: symptoms reduced:  no    Directional Preference:  Extension, Neutral    Isometric Tests/Motor Control:    Ability to brace abdomen in sitting and supine:  Abdominal Activation: yes    Dynamic Stability Test:    Plank Position:  Toes  Results:  pass    Remedial Side Plank:    Results:  pass    Right Side Plank Position:  Toes  Results:  pass    Left Side Plank:  Toes  Results:  pass    Bridge  Glutes:  active  Hamstring:  inactive  Abdominal wall:  active    SPINE ASSESSMENT    Assessment:   Findings consistent with 35 y.o., male with   Encounter Diagnosis   Name Primary?   . Back pain Yes   .  with pain, ROM limitations, strength limitations, functional limitations    Prognosis:  Good      Contraindications/Precautions/Limitation:  Per protocol and Per diagnosis  Short Term Goals:  Increase ROM  WNL, Increase strength WNL and Minimal assistance with HEP/ education concepts   Long Term Goals:  Patient demonstrates improved functional core strength, Patient will return to full prior level of function  , Independent with symptom management, run without increase in pain     Treatment Plan:     Patient/family involved in  developing goals and treatment plan: yes  Freq 1-2 times per week(s) for 2 month(s)    Treatment plan inclusive of:   Exercise: Stretching, Progressive Resistive, Aerobic exercise    Manual Techniques:  Myofascial Release    Modalities:  Functional/Therapeutic activites per flowsheet, Interferential, TENS, Ther Exercise per flowsheet   Functional: Proprioception/Dynamic stability, Sports specific, Functional rehab    Patient was educated on how the principles of the Active Spine Program will benefit their condition.  Patient was educated on the benefits of maintaining an active lifestyle. The patient was instructed on core strengthening exercises for spine support and stability; flexibility exercise for the upper and lower extremities to reduce movement stress to the spine; and aerobic exercise for weight management and endurance.  The patient was also educated on proper posture and body mechanics to avoid painful movements and to work on any directional preference positions.  Modalities for pain control, and traction will be used as necessary to benefit the patients symptoms.          Patient was instructed how to gradually progress their exercise routine within their range of tolerable symptoms.  Patient was advised that maintenance and consistency with the exercise routine at home is key to success.  And that it is critical that they notify their therapist of any prescribed activity that increases their symptoms so that the routine may be modified and progressed.      Thank you for the referral of this patient to the [facility] Active Spine Program.    [name], PT, DPT        Lumbar Spine Flowsheet Date Date Date Date Date Date Date Date Date Date Date   Exercise IE             Treadmill hep             Bike hep             Elliptical              Water walking              Stair Master              Abdominal Isometrics              Glute Isometric              Curl Up 5x  Bridge hep              Remedial              Plank L2 hep             Side Plank L2 hep             Clams              Bird dog              Lat Pull Down              Lunges              Upper Extremity Torsional Exercise              Lower Extremity Torsional Exercise              Stir the pot              Hamstring Stretch hep3x30             Lateral Hamstring Stretch (up and over)              Prone quad Stretch 30'' hep             Wall Hamstring Stretch

## 2011-11-05 ENCOUNTER — Encounter: Payer: Self-pay | Admitting: Otolaryngology

## 2011-11-11 ENCOUNTER — Ambulatory Visit: Payer: Self-pay

## 2011-11-13 ENCOUNTER — Ambulatory Visit: Payer: Self-pay

## 2011-11-13 ENCOUNTER — Ambulatory Visit: Payer: Self-pay | Admitting: Primary Care

## 2011-11-13 DIAGNOSIS — M549 Dorsalgia, unspecified: Secondary | ICD-10-CM

## 2011-11-16 ENCOUNTER — Ambulatory Visit: Payer: Self-pay | Admitting: Orthopedic Surgery

## 2011-11-18 ENCOUNTER — Ambulatory Visit: Payer: Self-pay

## 2011-11-20 ENCOUNTER — Other Ambulatory Visit: Payer: Self-pay | Admitting: Primary Care

## 2011-11-20 ENCOUNTER — Ambulatory Visit: Payer: Self-pay | Admitting: Primary Care

## 2011-11-20 ENCOUNTER — Encounter: Payer: Self-pay | Admitting: Primary Care

## 2011-11-20 VITALS — BP 110/72 | HR 68 | Ht 69.0 in | Wt 198.0 lb

## 2011-11-20 DIAGNOSIS — M25562 Pain in left knee: Secondary | ICD-10-CM

## 2011-11-26 ENCOUNTER — Ambulatory Visit: Payer: Self-pay

## 2011-11-26 DIAGNOSIS — M549 Dorsalgia, unspecified: Secondary | ICD-10-CM

## 2011-11-27 ENCOUNTER — Telehealth: Payer: Self-pay | Admitting: Orthopedic Surgery

## 2011-11-27 ENCOUNTER — Other Ambulatory Visit: Payer: Self-pay | Admitting: Orthopedic Surgery

## 2011-11-27 MED ORDER — MELOXICAM 7.5 MG PO TABS *I*
7.5000 mg | ORAL_TABLET | Freq: Two times a day (BID) | ORAL | Status: DC
Start: 2011-11-27 — End: 2012-02-05

## 2011-12-15 ENCOUNTER — Encounter: Payer: Self-pay | Admitting: Otolaryngology

## 2011-12-15 ENCOUNTER — Ambulatory Visit: Payer: Self-pay | Admitting: Otolaryngology

## 2011-12-15 ENCOUNTER — Encounter: Payer: Self-pay | Admitting: Primary Care

## 2011-12-15 VITALS — BP 137/87 | HR 60 | Ht 69.0 in | Wt 203.0 lb

## 2011-12-15 DIAGNOSIS — H9319 Tinnitus, unspecified ear: Secondary | ICD-10-CM

## 2011-12-15 DIAGNOSIS — H9190 Unspecified hearing loss, unspecified ear: Secondary | ICD-10-CM

## 2011-12-15 DIAGNOSIS — R42 Dizziness and giddiness: Secondary | ICD-10-CM

## 2011-12-15 DIAGNOSIS — H8101 Meniere's disease, right ear: Secondary | ICD-10-CM | POA: Insufficient documentation

## 2011-12-16 ENCOUNTER — Encounter: Payer: Self-pay | Admitting: Orthopedic Surgery

## 2011-12-16 ENCOUNTER — Ambulatory Visit: Payer: Self-pay | Admitting: Orthopedic Surgery

## 2011-12-16 VITALS — BP 139/81 | Ht 69.0 in | Wt 192.0 lb

## 2011-12-16 DIAGNOSIS — IMO0002 Reserved for concepts with insufficient information to code with codable children: Secondary | ICD-10-CM

## 2011-12-16 DIAGNOSIS — R52 Pain, unspecified: Secondary | ICD-10-CM

## 2011-12-17 ENCOUNTER — Ambulatory Visit: Payer: Self-pay

## 2011-12-24 ENCOUNTER — Encounter: Payer: Self-pay | Admitting: Gastroenterology

## 2011-12-24 ENCOUNTER — Encounter: Payer: Self-pay | Admitting: Primary Care

## 2011-12-25 ENCOUNTER — Encounter: Payer: Self-pay | Admitting: Sports Medicine

## 2011-12-25 ENCOUNTER — Ambulatory Visit: Payer: Self-pay | Admitting: Sports Medicine

## 2011-12-25 VITALS — BP 151/83 | Ht 69.0 in | Wt 199.0 lb

## 2011-12-25 DIAGNOSIS — M25569 Pain in unspecified knee: Secondary | ICD-10-CM

## 2011-12-28 ENCOUNTER — Ambulatory Visit: Payer: Self-pay | Admitting: Sports Medicine

## 2011-12-30 ENCOUNTER — Encounter: Payer: Self-pay | Admitting: Optometry

## 2011-12-30 DIAGNOSIS — H21233 Degeneration of iris (pigmentary), bilateral: Secondary | ICD-10-CM | POA: Insufficient documentation

## 2011-12-30 DIAGNOSIS — H21239 Degeneration of iris (pigmentary), unspecified eye: Secondary | ICD-10-CM

## 2011-12-30 HISTORY — DX: Degeneration of iris (pigmentary), unspecified eye: H21.239

## 2011-12-31 ENCOUNTER — Encounter: Payer: Self-pay | Admitting: Optometry

## 2011-12-31 ENCOUNTER — Ambulatory Visit: Payer: Self-pay | Admitting: Optometry

## 2011-12-31 VITALS — BP 157/99 | HR 68 | Ht 69.0 in | Wt 199.0 lb

## 2011-12-31 DIAGNOSIS — Z973 Presence of spectacles and contact lenses: Secondary | ICD-10-CM

## 2011-12-31 DIAGNOSIS — H5210 Myopia, unspecified eye: Secondary | ICD-10-CM

## 2011-12-31 DIAGNOSIS — H21239 Degeneration of iris (pigmentary), unspecified eye: Secondary | ICD-10-CM

## 2011-12-31 DIAGNOSIS — H52209 Unspecified astigmatism, unspecified eye: Secondary | ICD-10-CM

## 2012-01-05 ENCOUNTER — Encounter: Payer: Self-pay | Admitting: Sports Medicine

## 2012-01-05 NOTE — OR PreOp (Signed)
[facility] Pre-operative Guidelines    Day of surgery arrival Time:  The surgery center will call you the business day before your procedure between 2 and 5:00pm.     Eating and drinking guidelines:  No solid foods after midnight the night before your procedure.  Adults-  May have clear liquids (water, soda or apple juice only) up to 4 hours prior to your arrival time and then nothing by mouth.    Medications:  Medications to be taken on the day of surgery:  Please do not take any of your normal medications except pain medications.    Items to bring on the day of surgery:  Insurance card, photo ID.   Your crutches, knee brace or arm sling. Please leave your crutches in the car.    Clothing, Jewelry, valuables and eyeglass case:  Wear loose fitting clothing that will fit over a bulky dressing.  Leave all jewelry and valuables at home.  Bring your eyeglasses and eyeglass case. You cannot wear contact lenses.       Surgeon Instructions:  Please read all pre-operative instructions carefully, and follow your surgeons guidelines if different from these instructions.

## 2012-01-08 HISTORY — PX: OTHER SURGICAL HISTORY: SHX169

## 2012-01-11 ENCOUNTER — Ambulatory Visit: Payer: Self-pay | Admitting: Orthopedic Surgery

## 2012-01-20 ENCOUNTER — Ambulatory Visit
Admit: 2012-01-20 | Discharge: 2012-01-20 | Disposition: A | Discharge status: Home / Self Care | Payer: Self-pay | Source: Ambulatory Visit | Attending: Sports Medicine | Admitting: Sports Medicine

## 2012-01-20 HISTORY — DX: Dizziness and giddiness: R42

## 2012-01-20 MED ORDER — MIDAZOLAM HCL 1 MG/ML IJ SOLN *I* WRAPPED
INTRAMUSCULAR | Status: AC
Start: 2012-01-20 — End: 2012-01-20
  Filled 2012-01-20: qty 2

## 2012-01-20 MED ORDER — LIDOCAINE HCL 1 % IJ SOLN
0.1000 mL | Freq: Once | INTRAMUSCULAR | Status: DC | PRN
Start: 2012-01-20 — End: 2012-01-21
  Administered 2012-01-20: 0.1 mL via SUBCUTANEOUS

## 2012-01-20 MED ORDER — CEFAZOLIN SODIUM 1000 MG IJ SOLR *I*
INTRAMUSCULAR | Status: AC
Start: 2012-01-20 — End: 2012-01-20
  Filled 2012-01-20: qty 20

## 2012-01-20 MED ORDER — CEFAZOLIN SODIUM 1000 MG IJ SOLR *I*
2000.0000 mg | INTRAMUSCULAR | Status: DC
Start: 2012-01-20 — End: 2012-01-21

## 2012-01-20 MED ORDER — FENTANYL CITRATE 50 MCG/ML IJ SOLN *WRAPPED*
INTRAMUSCULAR | Status: AC
Start: 2012-01-20 — End: 2012-01-20
  Filled 2012-01-20: qty 2

## 2012-01-20 MED ORDER — ONDANSETRON HCL 2 MG/ML IV SOLN *I*
INTRAMUSCULAR | Status: AC
Start: 2012-01-20 — End: 2012-01-20
  Filled 2012-01-20: qty 2

## 2012-01-20 MED ORDER — LIDOCAINE HCL (XYLOCAINE) 20 MG/ML IV/IJ SOLN WRAPPED *I*
Status: AC
Start: 2012-01-20 — End: 2012-01-20
  Filled 2012-01-20: qty 5

## 2012-01-20 MED ORDER — LACTATED RINGERS IV SOLN *I*
20.0000 mL/h | INTRAVENOUS | Status: DC
Start: 2012-01-20 — End: 2012-01-21
  Administered 2012-01-20: 20 mL/h via INTRAVENOUS

## 2012-01-20 MED ORDER — KETOROLAC TROMETHAMINE 30 MG/ML IJ SOLN *I*
INTRAMUSCULAR | Status: AC
Start: 2012-01-20 — End: 2012-01-20
  Filled 2012-01-20: qty 1

## 2012-01-20 MED ORDER — DEXTROSE IN LACTATED RINGERS 5 % IV SOLN *I*
100.0000 mL/h | INTRAVENOUS | Status: DC
Start: 2012-01-20 — End: 2012-01-21

## 2012-01-20 MED ORDER — DEXAMETHASONE SOD PHOSPHATE PF 10 MG/ML IJ SOLN *I*
INTRAMUSCULAR | Status: AC
Start: 2012-01-20 — End: 2012-01-20
  Filled 2012-01-20: qty 1

## 2012-01-20 MED ORDER — HYDROCODONE-ACETAMINOPHEN 5-500 MG PO TABS
1.0000 | ORAL_TABLET | ORAL | Status: DC | PRN
Start: 2012-01-20 — End: 2012-02-05

## 2012-01-20 NOTE — Anesthesia Pre-procedure Eval (Signed)
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    Vertigo      Steroid injection into right ear 12/17/2011     Past Surgical History   Procedure Date    Lumbar fusion      Lumbar Vertebral Fusion Conversion Data     Spinal fusion 1999     L5-S1    Tympanostomy tube placement      Social History  History   Substance Use Topics    Smoking status: Former Smoker -- 1.0 packs/day for 6 years     Types: Cigarettes     Quit date: 12/07/2005    Smokeless tobacco: Never Used    Comment: Quit 2007    Alcohol Use: 0.0 oz/week      Social use      History   Drug Use No     ______________________________________________________________________  Allergies: No Known Allergies (drug, envir, food or latex)   Cannot display prior to admission medications because the patient has not been admitted in this contact.        Current Outpatient Prescriptions   Medication Sig    cholecalciferol (VITAMIN D) 1000 UNIT tablet daily as needed    Multiple Minerals (CHELATED MULTIPLE MINERAL PO) Take by mouth      Ginkgo Biloba (GINKGO) 60 MG TABS Take by mouth      Multiple Vitamin (MULTIVITAMIN) per tablet TAKE 1 TABLET DAILY.    HYDROcodone-acetaminophen (VICODIN) 5-500 MG per tablet Take 1-2 tablets by mouth Q4-6H PRN for Pain   MDD:8    meloxicam (MOBIC) 7.5 MG tablet Take 1 tablet (7.5 mg total) by mouth 2 times daily       Current Facility-Administered Medications   Medication    lidocaine 1 % injection 0.1 mL    Lactated Ringers Infusion    ceFAZolin (ANCEF) injection 2,000 mg    dextrose 5 % in lactated ringers      Admission Medications:  Scheduled Meds        cefazolin IV     IV Meds   PRN Meds        lidocaine 0.1 mL at 01/20/12 1315     Anesthesia EvaluationInformation Source: per records, per patient          Pulmonary     + Smoker            currently    + Asthma            mild intermittent     GI/Hepatic/Renal  Last PO Intake: Enter Last PO Intake in ROS/Med Hx Tab                  Nursing Reported PO Status: Date Last PO Fluids: 01/20/12 0900 (water)  Date Last PO Solids: 01/19/12 2355  ______________________________________________________________________  Physical Exam    Airway            Mouth opening: normal  Mallampati: II            TM distance (fb): >3 FB            TM distance (cm): 3            Neck ROM: full  Dental   Normal Exam   Cardiovascular  Normal Exam           Rhythm: regular           Rate: normal  No murmur     Pulmonary   pulmonary exam normal    breath sounds clear to auscultation    Mental Status   Normal  evaluation    oriented to person, place and time         Most Recent Vitals: BP: 139/67 mmHg (01/20/12 1253)  Heart Rate: 66  (01/20/12 1253)  Temp: 36.7 C (98.1 F) (01/20/12 1253)  Resp: 18  (01/20/12 1253)  Height: 175.3 cm (5\' 9" ) (01/20/12 1253)  weight: 92.715 kg (204 lb 6.4 oz) (01/20/12 1253)  BMI (Calculated): 30.2  (01/20/12 1253)  SpO2: 99 % (01/20/12 1253)    Vital Sign Ranges (last 24hrs)  Temp:  [36.7 C (98.1 F)] 36.7 C (98.1 F)  Heart Rate:  [66] 66   Resp:  [18] 18   BP: (139)/(67) 139/67 mmHg   O2 Device: None (Room air) (01/20/12 1253)    Most Recent Lab Results   Blood Type  No results found for this basename: aborh,  abs   CBC  Lab Results   Component Value Date    WBC 13.0* 06/23/2010    HCT 45 06/23/2010    PLT 296 06/23/2010   Chem-7  Lab Results   Component Value Date    NA 135 06/23/2010    K 4.0 06/23/2010    CL 96 06/23/2010    CO2 29* 06/23/2010    UN 20 06/23/2010    CREAT 0.85 06/23/2010    GLU 96 06/23/2010   Estimated Creatinine Clearance: 137.7  ml/min (based on Cr of 0.85).  Electrolytes  Lab Results   Component Value Date    CA 9.7 06/23/2010   Coags  Lab Results   Component Value Date    PTI 12.6 06/23/2010    INR 0.9 06/23/2010    PTT 25.2 06/23/2010   LFTs  Lab Results   Component Value Date    AST 25 06/23/2010    ALT 31 06/23/2010    ALK 69 06/23/2010     Bilirubin,Total   Date Value Range Status   06/23/2010 0.4  0.0-1.2 mg/dL Final     Pregnancy Test:  No results found for this basename: PUPT,  UPREG,  SPREG,  HCG1       ECG Results  No results found for this basename: rate,  PR,  statement       Radiology: No relevant studies     ________________________________________________________________________  Medical Problems  Patient Active Problem List   Diagnoses Date Noted    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    Left Medial Meniscus Tear 12/2011 12/24/2011    Left Lateral Meniscus Tear 12/2011 12/24/2011    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    LBP (low back pain) 10/26/2011    Reactive airway disease 06/13/2011    Hypertension 11/20/2010    Right Tinnitus 11/20/2010    Right Hearing Loss 09/06/2010     09/2010      Herniated Lumbar Disc 06/20/2010  L4-L5        Lumbar Canal Stenosis 06/20/2010     L4-L5        Allergic Rhinitis 03/15/2009      h/o allergy shots        Attention-deficit / Hyperactivity Disorder 09/07/2005    Right Medial Collateral Ligament Tear 3/05 02/05/2004    Right Medial Meniscus Tear 3/05 02/05/2004       PreOp/PreProcedure Diagnosis (For more detail see procedural consent)            Lt knee pain  Planned Procedure (For more detail see procedural consent)            Lt knee arthroscopy  Plan   ASA Score 2  Anesthetic Plan (general); Induction (routine IV); Airway (LMA); Line ( use current access); Monitoring (standard ASA); Positioning (supine); Pain (per surgical team); PostOp (PACU)    Informed Consent     Risks:          Risks discussed were commensurate with the plan listed above with the following  specific points:  N/V, sore throat, aspiration , damage to:(eyes, nerves, teeth), allergic Rx, unexpected serious injury, death    Anesthetic Consent:         Anesthetic plan and risks discussed with:  patient    Plan discussed with:  attending      PEC/PreOp Attestation: Anesthesia options were discussed with the patient or proxy and they understand the risks and benefits of the various anesthetic options.    Author: Treasa School, CRNA

## 2012-01-20 NOTE — Anesthesia Pre-procedure Eval (Signed)
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    Vertigo      Steroid injection into right ear 12/17/2011     Past Surgical History   Procedure Date    Lumbar fusion      Lumbar Vertebral Fusion Conversion Data     Spinal fusion 1999     L5-S1    Tympanostomy tube placement      Social History  History   Substance Use Topics    Smoking status: Former Smoker -- 1.0 packs/day for 6 years     Types: Cigarettes     Quit date: 12/07/2005    Smokeless tobacco: Never Used    Comment: Quit 2007    Alcohol Use: 0.0 oz/week      Social use      History   Drug Use No     ______________________________________________________________________  Allergies: No Known Allergies (drug, envir, food or latex)   Cannot display prior to admission medications because the patient has not been admitted in this contact.        Current Outpatient Prescriptions   Medication Sig    cholecalciferol (VITAMIN D) 1000 UNIT tablet daily as needed    Multiple Minerals (CHELATED MULTIPLE MINERAL PO) Take by mouth      Ginkgo Biloba (GINKGO) 60 MG TABS Take by mouth      Multiple Vitamin (MULTIVITAMIN) per tablet TAKE 1 TABLET DAILY.    HYDROcodone-acetaminophen (VICODIN) 5-500 MG per tablet Take 1-2 tablets by mouth Q4-6H PRN for Pain   MDD:8    meloxicam (MOBIC) 7.5 MG tablet Take 1 tablet (7.5 mg total) by mouth 2 times daily       Current Facility-Administered Medications   Medication    lidocaine 1 % injection 0.1 mL    Lactated Ringers Infusion    ceFAZolin (ANCEF) injection 2,000 mg     Admission Medications:  Scheduled  Meds        cefazolin IV     IV Meds   PRN Meds        lidocaine 0.1 mL at 01/20/12 1315     Anesthesia EvaluationInformation Source: per records, per patient          Pulmonary     + Smoker            currently    + Asthma            mild intermittent     GI/Hepatic/Renal  Last PO Intake: Enter Last PO Intake in ROS/Med Hx Tab                  Nursing Reported PO Status: Date Last PO Fluids: 01/20/12 0900 (water)  Date Last PO Solids: 01/19/12 2355  ______________________________________________________________________  Physical Exam    Airway            Mouth opening: normal            Mallampati: II  TM distance (fb): >3 FB            TM distance (cm): 3            Neck ROM: full  Dental   Normal Exam   Cardiovascular  Normal Exam           Rhythm: regular           Rate: normal  No murmur     Pulmonary   pulmonary exam normal    breath sounds clear to auscultation    Mental Status   Normal  evaluation    oriented to person, place and time         Most Recent Vitals: BP: 139/67 mmHg (01/20/12 1253)  Heart Rate: 66  (01/20/12 1253)  Temp: 36.7 C (98.1 F) (01/20/12 1253)  Resp: 18  (01/20/12 1253)  Height: 175.3 cm (5\' 9" ) (01/20/12 1253)  weight: 92.715 kg (204 lb 6.4 oz) (01/20/12 1253)  BMI (Calculated): 30.2  (01/20/12 1253)  SpO2: 99 % (01/20/12 1253)    Vital Sign Ranges (last 24hrs)  Temp:  [36.7 C (98.1 F)] 36.7 C (98.1 F)  Heart Rate:  [66] 66   Resp:  [18] 18   BP: (139)/(67) 139/67 mmHg   O2 Device: None (Room air) (01/20/12 1253)    Most Recent Lab Results   Blood Type  No results found for this basename: aborh,  abs   CBC  Lab Results   Component Value Date    WBC 13.0* 06/23/2010    HCT 45 06/23/2010    PLT 296 06/23/2010   Chem-7  Lab Results   Component Value Date    NA 135 06/23/2010    K 4.0 06/23/2010    CL 96 06/23/2010    CO2 29* 06/23/2010    UN 20 06/23/2010    CREAT 0.85 06/23/2010    GLU 96 06/23/2010   Estimated Creatinine Clearance: 137.7 ml/min (based on Cr of  0.85).  Electrolytes  Lab Results   Component Value Date    CA 9.7 06/23/2010   Coags  Lab Results   Component Value Date    PTI 12.6 06/23/2010    INR 0.9 06/23/2010    PTT 25.2 06/23/2010   LFTs  Lab Results   Component Value Date    AST 25 06/23/2010    ALT 31 06/23/2010    ALK 69 06/23/2010     Bilirubin,Total   Date Value Range Status   06/23/2010 0.4  0.0-1.2 mg/dL Final     Pregnancy Test:  No results found for this basename: PUPT,  UPREG,  SPREG,  HCG1       ECG Results  No results found for this basename: rate,  PR,  statement       Radiology: No relevant studies     ________________________________________________________________________  Medical Problems  Patient Active Problem List   Diagnoses Date Noted    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    Left Medial Meniscus Tear 12/2011 12/24/2011    Left Lateral Meniscus Tear 12/2011 12/24/2011    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    LBP (low back pain) 10/26/2011    Reactive airway disease 06/13/2011    Hypertension 11/20/2010    Right Tinnitus 11/20/2010    Right Hearing Loss 09/06/2010     09/2010      Herniated Lumbar Disc 06/20/2010      L4-L5        Lumbar Canal Stenosis 06/20/2010  L4-L5        Allergic Rhinitis 03/15/2009      h/o allergy shots        Attention-deficit / Hyperactivity Disorder 09/07/2005    Right Medial Collateral Ligament Tear 3/05 02/05/2004    Right Medial Meniscus Tear 3/05 02/05/2004       PreOp/PreProcedure Diagnosis (For more detail see procedural consent)            Lt knee pain  Planned Procedure (For more detail see procedural consent)            Lt knee arthroscopy  Plan   ASA Score 2  Anesthetic Plan (general); Induction (routine IV); Airway (LMA); Line ( use current access); Monitoring (standard ASA); Positioning (supine); Pain (per surgical team); PostOp (PACU)    Informed Consent     Risks:          Risks discussed were commensurate with the plan listed above with the following specific points:  N/V,  sore throat, aspiration , damage to:(eyes, nerves, teeth), allergic Rx, unexpected serious injury, death    Anesthetic Consent:         Anesthetic plan and risks discussed with:  patient    Plan discussed with:  surgeon and CRNA      Attending Attestation: The patient or proxy understand and accept the risks and benefits of the anesthesia plan. By accepting this note, I attest that I have personally performed the history and physical exam and prescribed the anesthetic plan within 48 hours prior to the anesthetic as documented by me above.    Author: Merry Proud, MD

## 2012-01-20 NOTE — INTERIM OP NOTE (Signed)
Interim Op Note    Date of Surgery: 01/20/12  Surgeon: Elease Hashimoto  Co-Surgeon:   First Assistant: Mayford Knife  Second Assistant:     Pre-Op Diagnosis: L knee MMT    Anesthesia Type: General    Post-Op Diagnosis:    Primary: Same, horizontal  Secondary: L knee LMT, radial  Tertiary: L knee plica    Additional Findings (Including unexpected complications): none    Procedure(s) Performed (including CPT 4 Code if available)  L knee arthroscopic PMM, PLM, plica excision    Estimated Blood Loss: Min   Packing: No  Drains: No  Fluid Totals: Intakes: per anesthesia    Outputs: no foley  Specimens to Pathology: no  Patient Condition: good

## 2012-01-20 NOTE — Anesthesia Post-procedure Eval (Signed)
Anesthesia Post-op Note    Patient: [name]    Anesthesia type: General    Patient location: PACU    Mental Status: Recovered to baseline    Patient able to participate in this evaluation: yes    Post-op vital signs: stable    Post-op vital signs noted above are within patients normal range    Respiratory function: baseline    Airway patent: Yes    Cardiovascular and hydration status stable: Yes    Post-Op pain: Adequate analgesia    Post-Op assessment: no apparent anesthetic complications    Complications: none    Attending Attestation: All indicated post anesthesia care provided    Author: Merry Proud, MD as of: 01/20/2012  at: 4:01 PM

## 2012-01-20 NOTE — Discharge Instructions (Signed)
DISCHARGE INSTRUCTIONS  Knee Arthroscopy  Dr. Lanae Boast  Phone [phone]    [facility]  [address] [place] [address]      ACTIVITY:  Use crutches at least for 48 hours after surgery.  Then you may start to weight bear on that leg and wean off crutches as tolerated by the pain.    -  *Wiggle your toes of affected limb, squeeze/fire your quadriceps muscle, and pump your foot/ankle up and down whenever you think bout it.  The more often, the better (pain guided).  -   Physical Therapy can start next week.    DIET:  -  Start with liquids and advance your diet as tolerated.    MEDICATIONS:  -  Prescription given to patient:Vicodin- pain pill.  No Tylenol.  Can alternate Motrin/Advil.   For example: you can take 3-4 over-the-counter Advil with breakfast, withlunch and with dinner.  Your pain medication can be taken in-between the Advil dosages.  That way, you can take something every 2-3 hours, if needed for pain.   -  If you are taking prescribed pain medication, you should not drive, operate machine/power tools, or drink any alcoholic beverages.  -  Be aware that all pain medications may cause nausea/stomach upset and constipation.  Try to take your pain medications with food to avoid aggravating your stomach.  Over the counter stool softeners only as needed.    DRESSINGS:  -  Keep your dressing clean and dry.  Remove  your dressing after 48 hours.  Steri strips stay on one week.  Once bandage is removed, you may shower.  You can not soak (ex: Hot tub or bath tub). Re-wrap ACE around the knee tohelp with compression and reduce swelling.    -  While you are in your initial post operative dressing, use ice over your knee as much as possible.   Once the ace wrap/dressing comes offin 48 hours, use ice at 20 min intervals on and off.  Use a thin cloth between your skin and the ice.  Ice for as long as you have pain +/- swelling.  It is good to ice after doing your physical therapy  exercises.    SPECIAL INSTRUCTIONS:  -  You may drive AFTER you are off the crutches if your RIGHT knee was the operative knee.  You may drive when you are off pain meds  and feel comfortable if your LEFT knee was the operative knee.    YOU SHOULD CALL YOUR DOCTOR FOR ANY OF THE FOLLOWING:  - Fever of 101 degree or higher.  - Redness, warmth in the leg, knee or calf.  - Foul smelling drainage from incision.   - Pain that does not lessen with pain medication as prescribed by your doctor.  - Persistent nausea or vomiting into the next day.  - Bleeding or continuous oozing that saturates the bandage that does not stop after applying pressure to the wound for 10 minutes.  - Increased swelling of toes or severe tightness of bandage not relieved with elevation of the limb above the level of your heart.  - Pale, blue or cold toe nail beds (compared to opposite leg).  - If you have not urinated within 6 hrs. after discharge.    Due to the lasting effects of anesthesia we recommend you do not make any important decisions for 24 hrs.    FOLLOW UP CARE:  Your postoperative appointment   2-3 weeks after surgery  will have been made for you when the surgery was set up.  The appointment will be with Dr. Elease Hashimoto or his physician assistant Loura Back RPA-C or Meghan Lissow RPA-C.  If you need to change  the appointment please call  [phone].        Due to the sedation medication and or general anesthesia you have been given today, please follow these discharge instructions:    [x]  Do not drive or operate any machinery for 24 hours or the specified time frame that was recommended by your doctor, please refer to post op instructions.  [x]  Do not drink any alcoholic beverages for 24 hours after your procedure and/or if you are taking a narcotic pain reliever (e.g. Vicodin, Percocet or Tylenol #3).  [x]  Do not make any major decisions or sign contracts for 24 hours.  [x]  Prescription information given to patient and/or patient  representative.    Diet: begin with liquids, advance as tolerated.    Your last pain medication was given to you at: Not Given (Vicodin)    Your next dose of pain medication is due after: When needed    Toradol (like Ibuprofen) given at 3 pm You may take Ibuprofen again at 9 pm if needed    If unable to reach your doctor at [phone], call 911 for a true emergency or go to your nearest emergency department.    It is our recommendation that you to have someone stay with you for 24 hours following your procedure.

## 2012-01-20 NOTE — Anesthesia Post-procedure Eval (Signed)
Anesthesia Post-op Note    Patient: [name]    Procedure(s) Performed: left knee scope    Anesthesia type: General    Patient location: PACU    Mental Status: Recovered to baseline    Patient able to participate in this evaluation: yes  Last Vitals: BP: 123/77 mmHg (01/20/12 1630)  Heart Rate: 70  (01/20/12 1630)  Temp: 36.3 C (97.3 F) (01/20/12 1630)  Resp: 16  (01/20/12 1630)  Height: 175.3 cm (5\' 9" ) (01/20/12 1253)  weight: 92.715 kg (204 lb 6.4 oz) (01/20/12 1253)  BMI (Calculated): 30.2  (01/20/12 1253)  SpO2: 99 % (01/20/12 1630)      Post-op vital signs noted above are within patients normal range  Post-op vitals signs: stable  Respiratory function: baseline    Airway patent: Yes    Cardiovascular and hydration status stable: Yes    Post-Op pain: Adequate analgesia    Post-Op assessment: no apparent anesthetic complications    Complications: none    Attending Attestation: All indicated post anesthesia care provided    Author: Milus Height, MD  as of: 01/20/2012  at: 6:02 PM

## 2012-01-20 NOTE — H&P (Signed)
UPDATES TO PATIENT'S CONDITION on the DAY OF SURGERY/PROCEDURE    I. Updates to Patient's Condition (to be completed by a provider privileged to complete a H&P, following reassessment of the patient by the provider):    Full H&P done today; no updates needed.            II. Procedure Readiness   I have reviewed the patient's H&P and updated condition. By completing and signing this form, I attest that this patient is ready for surgery/procedure.      III. Attestation   I have reviewed the updated information regarding the patient's condition and it is appropriate to proceed with the planned surgery/procedure.    Lanae Boast, MD as of 1:14 PM 01/20/2012

## 2012-01-21 MED FILL — Bupivacaine Inj 0.25% w/ Epinephrine 1:200000 (PF): INTRAMUSCULAR | Qty: 30 | Status: AC

## 2012-01-21 MED FILL — Epinephrine HCl Inj 1 MG/ML: INTRAMUSCULAR | Qty: 2 | Status: AC

## 2012-01-21 NOTE — Op Note (Signed)
[name], [name] MR #:  [id number]   ACCOUNT #:  [id number] DOB:  03-Apr-1976    AGE:  36     SURGEON:  Lanae Boast, MD  CO-SURGEON:    ASSISTANT:    SURGERY DATE:  01/20/2012    PREOPERATIVE DIAGNOSIS:  Left knee medial meniscus tear.    POSTOPERATIVE DIAGNOSIS:    1. Left knee medial meniscus tear.  2. Left knee lateral meniscus tear.  3. Left knee plica.    OPERATIVE PROCEDURE:    1. Left knee arthroscopic partial medial meniscectomy.  2. Left knee arthroscopic lateral meniscectomy.  3. Left knee arthroscopic plica excision.    ANESTHESIA:  General plus local.    ESTIMATED BLOOD LOSS:  Minimal.    COMPLICATIONS:  None.    INDICATIONS:  [name] is a 36 year old male who began having left knee pain at approximately the beginning of December 2012.  The knee pain is medially based.  He does not recall any specific injuries.  He did describe some clicking, catching, and felt like the knee locked up on him going up and down stairs.  He had pain with pivoting maneuvers.  MRI was obtained and confirmed a medial meniscus tear.  Risks, benefits, alternatives to arthroscopic surgery were discussed with the patient.  Following this discussion, he wished to proceed and consent was signed in the office.    PROCEDURE:  The patient was met by Dr. Elease Hashimoto in the preanesthesia area where the left knee was identified and initialed as the correct surgical site.  Date of surgery update was completed, and consent was reviewed.  The patient was met by the anesthesia staff and brought back to the operating room.  He is placed on the operating table in the supine position.  SCD and Foley catheter were deferred.  General anesthesia was induced, and LMA was placed without difficulty. The left lower extremity was prepped and draped in usual sterile fashion.  Preoperative antibiotics were administered and a surgical time-out was completed.     5 cc of 0.25% Marcaine with epinephrine were injected subcutaneously at the site of the  medial portal site.  With the knee flexed at approximately 90 degrees, an 11 blade was used to make the standard lateral portal incision.  The arthroscopic cannula and trocar were introduced into the knee.  The camera was then introduced into the knee.  With the knee still flexed, the medial portal site was appropriately identified using an 18-gauge needle.  After identification, a size 11 blade was used to incise the skin and capsule.  The knee was brought into extension and valgus stress was applied.  The medial compartment was inspected and demonstrated a horizontal tear involving the central portion of the meniscus involving the posterior 3rd.  This was unstable with probing and a medial meniscectomy was performed using arthroscopic shaver and biters.  The medial compartment cartilage was normal and intact on both the medial femoral condyle and medial plateau.  The knee was then brought into flexion and the intercondylar notch was inspected.  The ACL and PCL were intact.  The knee was brought into a figure 4 configuration and the lateral compartment was inspected.  There was a radial tear of the lateral meniscus involving the central 3rd.  Using arthroscopic shaver and biters, a partial lateral meniscectomy was performed until a stable meniscus tissue was encountered.  The lateral compartment cartilage was normal and intact along both the lateral femoral condyle and lateral plateau.  The knee was again brought into extension and the patellofemoral compartment was inspected.  There was a large medial plica that engaged and the patella.  An excision of the plica was performed with the arthroscopic shaver.  The patellofemoral compartment cartilage was intact on both the patella and trochlea.  Following plica excision, the knee was washed out.  25 cc of 0.25% Marcaine with epinephrine was injected in the knee and all arthroscopic instrumentation was removed.  The incisions were closed with Steri-Strips.  The wounds  were dressed with Xeroform gauze, 4 x 4 fluffs, ABD pad, sterile Webril.  Counts were correct, and drapes taken down.  An Ace bandage was placed on the left lower extremity.  General anesthesia was terminated and the LMA was removed without difficulty.  The patient was moved to the hospital stretcher and taken to the recovery room in good condition.  He tolerated the procedure well.       Dr. Elease Hashimoto was present and scrubbed for the entire procedure.       Dictated By:  Levander Campion, MD,RES      ______________________________  Lanae Boast, MD    RW/MODL  DD:  01/20/2012 16:45:40  DT:  01/20/2012 19:31:44  Job #:  [id number]

## 2012-01-28 ENCOUNTER — Ambulatory Visit: Payer: Self-pay

## 2012-01-28 DIAGNOSIS — M25562 Pain in left knee: Secondary | ICD-10-CM

## 2012-01-28 NOTE — Progress Notes (Signed)
PT SPORTS REHABILITATION LE EVALUATION        Diagnosis: Left Knee scope    Onset date:  Chronic   Date of surgery: 01/20/12    History    [name] is a 36 y.o. male who is present today for left knee care status-post meniscectomy. He reports he has the script for therapy on his desk at work.   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 Complex tear involving the posterior horn and body of the medial meniscus with extension to both articular surfaces. The body of the medial meniscus is extruded into the medial gutter.    2. Small radial tear involving the lateral meniscus at the anterior   horn/body junction extending to both articular surfaces.    3. Mild edema surrounding the intact medial collateral ligament and patellar retinaculum, likely related to above described medial   compartment pathology.    4. Mild linear focus of edema within Hoffa's fat pad. These findings can be seen associated with an infrapatellar plica, however no definite plica is visualized.     5. Mild proximal patellar tendinosis. Mild increased signal is also seen at the distal patellar tendon.     6. Small to moderate joint effusion. There is evidence of a ruptured Baker's cyst.      Symptom location: Anterior, left  Relevant symptoms:  Aching, Pain , Decreased ROM, Decreased strength  Symptom frequency: Intermittent  Symptom intensity:  (0 - 10 scale): Now 7 Best 3 Worst 8   Night Pain: no   Restful sleep:   no and but not related to knee  Morning Pain/Stiffness: Deferred at this time   Symptoms worsen with: WB, Bending  Symptoms improve with: Rest,  Ice  Assistive device:  none  Patients goals for therapy: Return to sport, Reduce pain, Increase ROM, Increase strength     Objective    Observation: swelling at knee joint  Gait:  Antalgic    Lumbar Screen:  Not Tested, patient has known spine pain  Neurologic:  Sensation:  Light touch, Intact to gross screen    Palpation:  swelling @ joint  Incision:  Benign    ROM/Strength      KNEE LEFT RIGHT STRENGTH    PROM AROM PROM AROM Left Right   Flexion 100 60       Extension -6                        Functional:  Ascend stairs with reciprocal gait - unable to perform.  Descend stairs with reciprocal gait - unable to perform.  Return to sport/activities - unable to perform.    Assessment:    Findings consistent with 37 y.o., male with knee pain status post left knee scope with pain, ROM limitations, strength limitations, functional limitations    Prognosis:  Good   Contraindications/Precautions/Limitation:  Per protocol and Per diagnosis  Short Term Goals: (1 month(s)): Decrease pain to 2/10 , Increase ROM  WNL, Increase strength WNL and Minimal assistance with HEP/ education concepts  Long Term Goals: (2 month(s)): Pain/Sx 0 - minimal, ROM/  flexibility WNL , Restoration of functional strength, Independent with HEP/education , Functional return to ADLs / activites without limitation     Treatment Plan:   Options / plan reviewed with patient:  yes, he will follow-up in one week.  Freq 1-2 times per week for 2 month(s)    Treatment plan inclusive of:   Exercise: AROM, AAROM, PROM, Stretching, Progressive Resistive, Aerobic exercise   Manual Techniques:  N/A   Modalities:  Cold pack, Ther Exercise per flowsheet   Functional: Proprioception/Dynamic stability, Sports specific, Functional rehab    Thank you for referring this patient to [facility].    [name], PT, DPT      01/28/12   Bike 5'   Heel prop 1 min   Quad set 10x   Straight Leg raise 10x   Short arc quad 30x   Knee flexion  5' 92 deg   Hamstring curl 20x   Calf raise 15x       ice 10'

## 2012-02-04 ENCOUNTER — Ambulatory Visit: Payer: Self-pay

## 2012-02-04 DIAGNOSIS — M25569 Pain in unspecified knee: Secondary | ICD-10-CM

## 2012-02-04 NOTE — Progress Notes (Addendum)
Sports PT Daily Note    [name]   [id number]   Encounter Diagnosis   Name Primary?    Pain in joint, lower leg Yes         Subjective:  Pain Score:  0 He reports being pain-free at rest.  Mild soreness with exercise. He reports yesterday he noticed he was using his right LE to itch his left ankle.  Patient reports it does not feel itchy, however he is rubbing his left ankle.  Patient notices intermittent calf pain. He reports a twinge that resolves.  Pain: Improved    Objective:  Patient has a red rash on his left LE, not on the right leg.  Calf is not tender to palpation, he has swelling bilaterally at the ankles left more than right, no pitting edema. Homen's sign negative.  ROM - Improved, Left, Knee, EXT 5 he, FLEX 135  Strength - Improved,  Per Ther Ex flowsheet  Function: - Unchanged  Education:  Updated HEP, Verbal cues for ther ex, Joint ed concepts. Patient instructed to contact MD office if LE swelling or calf tenderness increases.       01/28/12  02/04/12    Bike  5'  10'    Heel prop  1 min      Quad set  10x  20x    Straight Leg raise  10x  30x    Short arc quad  30x  LAQ 5#  30x    Knee flexion  5' 92 deg  5'  135 deg    Hamstring curl  20x  5# 30x    Calf raise  15x  2 x 30x    Mini squat  30x    Clock step  5x ea          ice  10'  10'        Treatment Modalities:  Cryotherapy, Ther Exercise per flowsheet    Assessment:  Patient responding favorably  Patient had an itchy rash on his left ankle and lower leg.  Orthopedic RN Lance Coon) was consulted and advised the patient to use hydrocortisone cream. He will contact her tomorrow about the rash if it is worse.    Plan of Care:  Continue per Plan of care -  As written. He will follow-up in one week then transition to twice per month.  If his skin irritation or calf pain increases he will call the MD.    Thank you for referring this patient to [facility]    [facility]  [name], [place], DPT

## 2012-02-05 ENCOUNTER — Ambulatory Visit: Payer: Self-pay | Admitting: Primary Care

## 2012-02-05 ENCOUNTER — Ambulatory Visit: Payer: Self-pay

## 2012-02-05 ENCOUNTER — Encounter: Payer: Self-pay | Admitting: Primary Care

## 2012-02-05 VITALS — BP 120/82 | HR 72 | Ht 69.0 in | Wt 207.0 lb

## 2012-02-05 DIAGNOSIS — I10 Essential (primary) hypertension: Secondary | ICD-10-CM

## 2012-02-05 NOTE — Progress Notes (Signed)
The patient is s/p left knee arthroplasty on 01/20/2012. He c/o mild left calf  pain, there is no swelling or tenderness. The patient is worried about the possibility of a DVT, Dr. Elease Hashimoto informed and the patient was sent for an ultrasound.

## 2012-02-05 NOTE — Progress Notes (Signed)
Chief Complaint   Patient presents with    Hypertension        HYPERTENSION MANAGEMENT:     BP 120/90  Pulse 72  Ht 1.753 m (5\' 9" )  Wt 93.895 kg (207 lb)  BMI 30.57 kg/m2   Filed Vitals:    02/05/12 0800   BP: 120/82   Pulse:    Height:    Weight:            PATIENT DENIES: [x]  Chest Pain. [x]  Palpitations. [x]  Shortness of Breath.    SYMPTOMS:  3-4x a week has frontal headaches    HABITS:   --Sodium-   [] Yes  [x] No          --Exercise-  [] Yes  [x] No due to recent left knee surgery         --Smoking-  [] Yes  [x] No          --Caffeine Use- [] Yes  [x] No, stopped 3 months ago            HOME BLOOD PRESSURE: not taking    Current Outpatient Prescriptions   Medication Sig Dispense Refill    nortriptyline (PAMELOR) 10 MG capsule Take 2 Tablets Every Evening        Multiple Vitamin (MULTIVITAMIN) per tablet Take 1 tablet by mouth daily as needed        cholecalciferol (VITAMIN D) 1000 UNIT tablet Take 1,000 Units by mouth daily           Ginkgo Biloba (GINKGO) 60 MG TABS Take 1 tablet by mouth daily as needed            EXAM:    HEART: Regular rate and rhythm, no murmurs gallops or rubs.  LUNGS: Clear to auscultation bilaterally.    Assessment/Plan:    1.  HTN: Presently stable and at goal of less than 140/90.  He is currently on no medication.  Once he recovers from his recent left knee surgery, he'll resume exercising.  Diet reviewed.    RETURN: 3 months HTN

## 2012-02-09 ENCOUNTER — Ambulatory Visit: Payer: Self-pay

## 2012-02-09 DIAGNOSIS — M25569 Pain in unspecified knee: Secondary | ICD-10-CM

## 2012-02-09 NOTE — Progress Notes (Signed)
Sports PT Daily Note    [name]   [id number]   Encounter Diagnosis   Name Primary?    Pain in joint, lower leg Yes         Subjective:  Pain Score:  0  He has no significant knee pain.  He only has mild calf cramping when he rises from sitting.  Pain: Improved    Objective:  ROM - Improved, Left, Knee, EXT 5 he, FLEX 130  Strength - Improved,  Per Ther Ex flowsheet  Function: - Unchanged  Education:  Updated HEP, Verbal cues for ther ex, Joint ed concepts    Objective        01/28/12  02/04/12  02/09/12   Bike  5'  10'  10'   Heel prop  1 min      Quad set  10x  20x     Straight Leg raise  10x  30x     Short arc quad  30x  LAQ 5# 30x  P1.5 30x   Knee flexion  5' 92 deg  5' 135 deg     Hamstring curl  20x  5# 30x  1.5 P 30x   Calf raise  15x  2 x 30x  2 x 30x 10#   Mini squat   30x  30x   Clock step   5x ea  3 way 10x ea leg   Single leg balance   4 x 30"   Step up   4" 30x               ice  10'  10'  10'           Treatment Modalities:  Cryotherapy 10 min    Assessment:  Patient responding favorably    Plan of Care:  Continue per Plan of care -  As written Add step downs during next visit.    Thank you for referring this patient to [facility] Spine Rehabilitation    [place]

## 2012-02-11 ENCOUNTER — Ambulatory Visit: Payer: Self-pay | Admitting: Orthopedic Surgery

## 2012-02-17 ENCOUNTER — Ambulatory Visit: Payer: Self-pay | Admitting: Orthopedic Surgery

## 2012-02-17 ENCOUNTER — Encounter: Payer: Self-pay | Admitting: Orthopedic Surgery

## 2012-02-17 VITALS — BP 139/75 | Ht 69.0 in | Wt 200.0 lb

## 2012-02-17 VITALS — BP 139/75 | Ht 68.9 in | Wt 200.0 lb

## 2012-02-17 DIAGNOSIS — Z9889 Other specified postprocedural states: Secondary | ICD-10-CM

## 2012-02-17 DIAGNOSIS — M545 Low back pain, unspecified: Secondary | ICD-10-CM

## 2012-02-17 NOTE — Patient Instructions (Signed)
The diagnosis for today's visit is "back pain". While it may appear that not much more is known about your back after today's visit than there was when you arrived, this is not actually the case. You have been evaluated for a number of known diagnoses which contribute to back pain, and many have been ruled out. Based upon the history you have given, and findings on physical exam we may have ordered further testing; however, there may be clinical reasons to hold off on any further testing at this time. Back pain is extraordinarily frustrating for patients, and significantly interferes with your quality of life. It also remains one of the significant mysteries for modern medicine. We cannot always identify what it is specifically that is causing back pain, and consequently we cannot always quickly eliminate the pain. There are some basic principles of the care of back pain which are important to know. First, most patients with back pain do improve over the course of 4-6 weeks. It is important to remain as active as possible during the time you're in pain. Lying in bed or being immobile is actually worse for you in the long run. The best types of medications for back pain include anti-inflammatory medicines. Physical therapy can  sometimes be helpful in getting you back on your feet. Advanced imaging test like MRIs are generally not required, unless you have specific clinical clues that there may be some more  serious process going on. Your visit today was with a spinal  surgeon. While back surgery can be very beneficial, one area where it is highly unpredictable is for the treatment of back pain. We generally recommend very strongly that patients consider surgery for back pain only as a very, very last resort. Our practice is very sensitive to the fact that your life has been significantly disrupted by the back pain. We will make every effort to use "evidence based techniques" to help you. Unfortunately, the ultimate  resolution of back pain can be very challenging and sometimes takes a long time. We are not always successful in our effort to cure back pain. We can assure you that we will use every reasonable means at our disposal to help you through this difficult time.    I met with the patient and discussed the risks and benefits associated with a lumbar decompression and fusion or lumbar fusion.    I have specifically recommended that this patient consider the following procedure:    The patient understands that this surgery is elective, in that there is no medical reason that they must have surgery.  They understand that our goal is to decrease her symptoms; however, surgery tends to work better on the leg symptoms than it does on back symptoms.  There is a definite risk that it will continue to have some back pain.    We have discussed the hospitalization, the recovery time, the use of general anesthesia during the surgery.  We have discussed postoperative immobilization of the spine if applicable.  We have discussed time away from their job and other activities.  We have discussed their hospital stay and the potential for rehabilitation if applicable.    We discussed the risk of infection the possible need for further surgery if there is an infection.  We discussed the risk of bleeding and the possibility of blood transfusion including both autologous blood and blood bank blood.  We've discussed the risk of temporary or permanent nerve damage up to and including the risk of  paralysis.  We have discussed the concept of a dural tear, the management of a dural tear, and the possibility of further surgery if there is an undetected or unsuccessfully managed dural tear.  We have discussed risk of death, stroke, and blindness.  They understand that these are extraordinarily unlikely risks.  We have discussed the risk of incomplete relief of symptoms.    We have discussed the concept of a fusion.  I have explained the rationale  for the use of bone graft, and use of spinal implants.  They understand that if the fusion does not heal successfully, there could be loosening, migration, or failure of these implants.  This could result in the need for further surgery.  We have also discussed that a certain percentage of patients, perhaps as high as 20%, will go on to develop significant degenerative problems at adjacent levels of the spine.  These adjacent segment degenerations can require further surgeries.    We have specifically reviewed the patient's health history as it impacts potential healing of this surgery.  I have reviewed with the patient any preoperative consultations, medication changes or lifestyle changes (smoking) that would benefit them with regard to this surgery.    The patient's questions have been answered in detail.  They will be thinking about how they wish to proceed.  They will contact my office at [phone] if they wish to proceed.

## 2012-02-17 NOTE — Progress Notes (Signed)
This office note has been dictated.  UEA#:[id number]

## 2012-02-17 NOTE — Progress Notes (Signed)
Subjective:  36 y.o. male returns for follow-up of: Low back pain with intermittent left anterior shin pain    Patient report  Treatment since last Visit: 2 months of organized therapy, anti-inflammatories  Treatment to date has been: Minimally effective  Current Chief Complaint: Predominantly back pain.  This is present when sitting.  He cannot sit his desk.  He has significant limitations because of pain.  He is no longer training in the same way.        Interval Health History changes:none  Relevant New Positives on 14 point Spine Related Review of Systems: None   Focused Exam Update:  Light touch is normal.  Motor exam is normal.    Imaging:  No new x-rays      Recommendations:  Treatment Options:  Patient is a candidate for extension of the fusion to L4 for adjacent segment degeneration.  We have carefully discussed that fusion is unpredictable for back pain.  Fusion would likely involve new screws and iliac crest bone graft and possibly an interbody cage.  I would not place a cage if there was too much scar tissue.  I would likely revise the screws down to S1.  We discussed fossilization, general anesthesia, recovery time, time to resume activities.  We discussed risk of infection, bleeding and transfusion, nerve injury, paralysis, death, stroke, blindness, failure to fuse, implant failure, further adjacent segment problems in the future, need for further surgery.  We discussed alternative surgical approaches and answered his questions.  He understands there is no medical imperative to proceed.  He's thinking about how he wants to proceed and will let me know.    Key Discussion:  See above.  Questions invited and answered.    Follow-up: As needed    Orders for Next Visit: None

## 2012-02-17 NOTE — Progress Notes (Signed)
PATIENTBURYL, SCHNEIDERS MR #:  [id number]   ACCOUNT #:  [id number] DOB:  May 31, 1976   DICTATED BY:  Raechel Ache, PA DATE OF VISIT:  02/17/2012     CHIEF COMPLAINT:  Three weeks status post left knee PMM PLM.    INTERVAL HISTORY:  The patient presents here today stating overall he is feeling well with no complaints.  He is compliant with formal physical therapy and his home exercise program.  He is not needing any medications.  He states that the calf pain that he was experiencing before surgery continues to resolve.  Overall very pleased, no mechanical symptoms.  No calf pain, fevers or chills.    PHYSICAL EXAM:  Shows he is a polite, cooperative gentleman.  No warmth, erythema or ecchymosis.  His calf is nontender and supple.  Incisions are benign.  No signs of DVT or infection.  He is ligamentously stable.  He is neurovascularly intact.    ASSESSMENT:  Three weeks status post left knee arthroscopy.    PLAN:  Overall patient is doing well.  He has one more physical therapy appointment that he will keep and then he will remain compliant with therapy at a home exercise based.  Again, so far he is very pleased with the outcome of the surgery.  The scope pictures and operative findings were reviewed.  Questions otherwise invited and answered to his satisfaction.  We will see him back at the 8-month mark out from surgery or on an as needed basis.       Dictated By:  Raechel Ache, PA      ______________________________  Lanae Boast, MD    ML/MODL  DD:  02/17/2012 14:51:56  DT:  02/17/2012 15:44:41  Job #:  [id number]

## 2012-02-18 ENCOUNTER — Encounter: Payer: Self-pay | Admitting: Gastroenterology

## 2012-02-25 ENCOUNTER — Ambulatory Visit: Payer: Self-pay

## 2012-03-22 ENCOUNTER — Encounter: Payer: Self-pay | Admitting: Gastroenterology

## 2012-03-23 ENCOUNTER — Encounter: Payer: Self-pay | Admitting: Gastroenterology

## 2012-04-11 DIAGNOSIS — M25562 Pain in left knee: Secondary | ICD-10-CM

## 2012-04-11 NOTE — Progress Notes (Signed)
Sports and Spine Rehabilitation  Discharge Summary      [name]  [id number]  04/11/2012    Diagnosis: left knee pain s/p scope    SUMMARY OF TREATMENTS:  Received care for 3 rehabilitation visits    Attendance:  Fair    Compliance:  Good     The treatment(s) included:  Home program instruction, Therapeutic exercise (ROM/flexibility/strength), Electical stimulation, Ice/heat, Therapeutic activities dynamic stabilization    Treatment Goals:  Achieved  Range of Motion/Flexibility:  Achieved  Strength/Motor Performance:  Achieved  Functional Recovery:  Achieved, returned to work  Pain Control:  Unknown  Home Program:  Achieved  Other:  NA    REASON FOR DISCHARGE:  Patient did not return for follow-up , Patient discharged to HEP to achieve secondary goals     Prognosis at time of discharge:  good  Comments:  NA    Discharge planning:  Discussion regarding the maintenance of appropriate exercise and activity as part of a healthy lifestyle.  Optional utilization of Post-Rehab Conditioning Program [facility]) for transition and/or additional exercise/activity support    Thank you for the referral of this patient to [facility] Sport and Spine Rehabilitation.    [name]

## 2012-04-28 ENCOUNTER — Ambulatory Visit: Payer: Self-pay | Admitting: Orthopedic Surgery

## 2012-05-17 ENCOUNTER — Encounter: Payer: Self-pay | Admitting: Orthopedic Surgery

## 2012-05-17 ENCOUNTER — Ambulatory Visit: Payer: Self-pay | Admitting: Orthopedic Surgery

## 2012-05-17 VITALS — BP 130/74 | Ht 69.0 in | Wt 195.0 lb

## 2012-05-17 DIAGNOSIS — M25562 Pain in left knee: Secondary | ICD-10-CM

## 2012-05-17 NOTE — Progress Notes (Signed)
This office note has been dictated.  ZOX#:[id number]

## 2012-05-18 ENCOUNTER — Encounter: Payer: Self-pay | Admitting: Primary Care

## 2012-05-18 ENCOUNTER — Ambulatory Visit: Payer: Self-pay | Admitting: Primary Care

## 2012-05-18 VITALS — BP 118/74 | HR 68 | Ht 68.0 in | Wt 189.0 lb

## 2012-05-18 DIAGNOSIS — Z789 Other specified health status: Secondary | ICD-10-CM

## 2012-05-18 DIAGNOSIS — G43909 Migraine, unspecified, not intractable, without status migrainosus: Secondary | ICD-10-CM

## 2012-05-18 NOTE — Progress Notes (Signed)
Chief Complaint   Patient presents with   . Hypertension        HYPERTENSION MANAGEMENT:     BP 118/74  Pulse 68  Ht 1.727 m (5\' 8" )  Wt 85.73 kg (189 lb)  BMI 28.74 kg/m2        PATIENT DENIES:  [x]  Palpitations. [x]  Shortness of Breath.    SYMPTOMS: Much less migraines since starting Nortriptyline/Klonopin ~3 months ago by Dr Loreta Ave.  Only had 2 migraines since-Indocin helps. He also started a plant based diet x 3 months and has lost 11 lbs.  Rare brief sharp left CP.     HABITS:   --Sodium-   [x] Yes minimal  [] No          --Exercise-  [x] Yes swims at [facility] 0-6x  [] No          --Smoking-  [] Yes  [x] No          --Caffeine Use- [] Yes  [x] No             HOME BLOOD PRESSURE: not taking    He is expecting his 2nd child end 11/2012.    Medications reviewed and changes made     Current Outpatient Prescriptions   Medication Sig Dispense Refill   . clonazePAM (KLONOPIN) 0.5 MG tablet Take 0.5 mg by mouth 2 times daily   MDD mg       . nortriptyline (PAMELOR) 10 MG capsule Take 2 Tablets Every Evening       . cholecalciferol (VITAMIN D) 1000 UNIT tablet Take 1,000 Units by mouth daily          . Ginkgo Biloba (GINKGO) 60 MG TABS Take 1 tablet by mouth daily as needed            No current facility-administered medications for this visit.      EXAM:    HEART:  RRR, no m/g/r  LUNGS:  Clear    Assessment/Plan:    1.  HTN: Excellent control and currently at goal less than 140/90.  He is currently on no medicine.  Continue diet and exercise  2.  Migraine headaches: Much better since starting on clonazepam/nortriptyline.  Followup with Dr. Loreta Ave  3.  Vegan diet: Recommended he start taking a multivitamin as well as vitamin B12 250 mcg daily.  Will check an iron/B12/CBC.    RETURN: 3 months HTN  & 12/2012 CME

## 2012-05-18 NOTE — Progress Notes (Signed)
PATIENTKAVAUGHN, [name] MR #:  [id number]   ACCOUNT #:  [id number] DOB:  06/11/1976   DICTATED BY:  Raechel Ache, PA DATE OF VISIT:  05/17/2012     CHIEF COMPLAINT:  Three months status post left knee scope.    INTERVAL HISTORY:  The patient presents here today, overall stating he is doing well with no complaints.  He is pleased with the outcome of his rehab.  He states that he is compliant with a home exercise program 2-3 days a week.  However, he remains functionally limited due to low back pain that he is currently under treatment for.  He denies any mechanical symptoms, sense of instability.    PHYSICAL EXAM:  Shows a polite, cooperative gentleman.  No warmth, erythema, ecchymosis.  He has well-preserved motion.  Ligamentously stable.  Neurovascularly intact.    DIAGNOSIS:  Three months status post left knee scope, doing well.    PLAN:  Patient is doing very well.  His knee does not keep him from doing any activities.  We discussed the possibility of orthotics; however, he would not like to entertain that idea today.  He will continue with his rehab process, and we will see him back on a p.r.n. basis.       Dictated By:  Raechel Ache, PA      ______________________________  Lanae Boast, MD    ML/MODL  DD:  05/17/2012 16:16:10  DT:  05/18/2012 06:50:19  Job #:  [id number]

## 2012-08-09 ENCOUNTER — Encounter: Payer: Self-pay | Admitting: Gastroenterology

## 2012-08-12 ENCOUNTER — Ambulatory Visit: Payer: Self-pay | Admitting: Primary Care

## 2012-08-23 ENCOUNTER — Ambulatory Visit: Payer: Self-pay | Admitting: Primary Care

## 2012-08-23 ENCOUNTER — Encounter: Payer: Self-pay | Admitting: Primary Care

## 2012-08-23 VITALS — BP 108/72 | HR 76 | Ht 68.0 in | Wt 188.0 lb

## 2012-08-23 DIAGNOSIS — G43909 Migraine, unspecified, not intractable, without status migrainosus: Secondary | ICD-10-CM

## 2012-08-23 DIAGNOSIS — Z789 Other specified health status: Secondary | ICD-10-CM

## 2012-08-23 DIAGNOSIS — Z23 Encounter for immunization: Secondary | ICD-10-CM

## 2012-08-23 DIAGNOSIS — I10 Essential (primary) hypertension: Secondary | ICD-10-CM

## 2012-08-23 LAB — CBC
Hematocrit: 43 % (ref 40–51)
Hemoglobin: 14.8 g/dL (ref 13.7–17.5)
MCV: 90 fL (ref 79–92)
Platelets: 275 10*3/uL (ref 150–330)
RBC: 4.8 MIL/uL (ref 4.6–6.1)
RDW: 12.3 % (ref 11.6–14.4)
WBC: 6.3 10*3/uL (ref 4.2–9.1)

## 2012-08-23 LAB — VITAMIN B12: Vitamin B12: >2000 pg/mL — ABNORMAL HIGH (ref 211–946)

## 2012-08-23 LAB — IRON: Iron: 104 ug/dL (ref 45–170)

## 2012-08-23 NOTE — Addendum Note (Signed)
Addended by: Glena Norfolk on: 08/23/2012 12:49 PM     Modules accepted: Orders

## 2012-08-23 NOTE — Progress Notes (Signed)
Chief Complaint   Patient presents with   . Hypertension   . Headache      HYPERTENSION MANAGEMENT:     BP 108/72  Pulse 76  Ht 1.727 m (5\' 8" )  Wt 85.276 kg (188 lb)  BMI 28.59 kg/m2        PATIENT DENIES:  [x]  Chest Pain. [x]  Palpitations. [x]  Shortness of Breath.    SYMPTOMS: He has had a low grade migraine x 2 weeks.  He is followed by Dr Loreta Ave and is titrating Topamax up to 100mg  a day by next week.  He also is on Nortriptyline 30mg  hs but it does not sedate him.  Chronic lumbar pain can awaken him.  Wife is pregnant and due 12/03/12.     HABITS:   --Sodium-   [] Yes  [x] No          --Exercise-  [x] Yes sporadic jogs/swims   [] No          --Smoking-  [] Yes  [x] No          --Caffeine Use- [] Yes  [x] No             HOME BLOOD PRESSURE:    Lost 19 lbs since 02/2012.  He has been following a Vegan diet for the past 3-4 months.      Current Outpatient Prescriptions   Medication Sig Dispense Refill   . topiramate (TOPAMAX) 100 MG tablet Take 100 mg by mouth at bedtime       . Cholecalciferol 5000 UNITS TABS Take 1 tablet by mouth daily       . Ferrous Sulfate (IRON) 28 MG TABS Take 1 tablet by mouth daily       . clonazePAM (KLONOPIN) 0.5 MG tablet Take 0.5 mg by mouth 2 times daily   MDD        . Multiple Vitamin (MULTIVITAMIN) TABS Take 1 tablet by mouth daily       . vitamin B-12 (CYANOCOBALAMIN) 250 MCG tablet Take 250 mcg by mouth daily       . nortriptyline (PAMELOR) 10 MG capsule Take 3 Tablets Every Evening         No current facility-administered medications for this visit.      Medications reviewed and no changes made     EXAM:    HEART:  RRR, no m/g/r  LUNGS:  Clear    Assessment/Plan:    1.  HTN: He is actually mildly low.  He has lost weight which has helped.  He is currently on no medication.  He will try to exercise on his back does not bother him.  2.  Migraine headaches: He still is quite symptomatic despite being on nortriptyline and Topamax.  He is followed by Dr. Loreta Ave.  3.  Vegan diet:  Will check  a CBC/B12/iron/vitamin D.  He will continue his supplements.  4.  Health maintenance: Flu shot given    RETURN: 12/2012 CME

## 2012-08-29 ENCOUNTER — Telehealth: Payer: Self-pay | Admitting: Primary Care

## 2012-08-29 DIAGNOSIS — E559 Vitamin D deficiency, unspecified: Secondary | ICD-10-CM

## 2012-08-29 LAB — VITAMIN D
25-OH VIT D2: <4 ng/mL
25-OH VIT D3: 24 ng/mL
25-OH Vit Total: 24 ng/mL — ABNORMAL LOW (ref 30–80)

## 2012-09-20 ENCOUNTER — Encounter: Payer: Self-pay | Admitting: Gastroenterology

## 2012-12-08 DIAGNOSIS — H5213 Myopia, bilateral: Secondary | ICD-10-CM | POA: Insufficient documentation

## 2012-12-08 DIAGNOSIS — H52203 Unspecified astigmatism, bilateral: Secondary | ICD-10-CM | POA: Insufficient documentation

## 2012-12-08 NOTE — Progress Notes (Signed)
Outpatient Visit      Patient name: [name]  DOB: [birth year]       Age: 37 y.o.  MR#: [id number]    Encounter Date: 12/31/2011    Subjective:      Chief Complaint   Patient presents with   . Eye Strain     HPI    ECV/HVF/OCT - CL wearer - PDS  Pt notes for the past few months, by early afternoon his eyes feel   strained or tired, if he's wearing CL's will switch to his glasses, and   vice versa, it doesn't matter which he's wearing.   +floaters occas peripherally, he thinks OS  DV stable OU. NV stable OU. Driving ok, denies glare/haloes.  No DM, no gtts.  -dryness   -redness   -itching   -burning   -tearing   -photophobia   -diplopia   +headaches every couple days  -pain   +floaters peripherally OS>OD for about 6 months  -flashes  Dx'd with Menieres Disease a little over a year ago, causes sudden onset   episodes Vertigo, frequency varies.  AWT: 10h  WTT: n/a  Re        has a current medication list which includes the following prescription(s): topiramate, cholecalciferol, iron, clonazepam, multivitamin, vitamin b-12, and nortriptyline.     has No Known Allergies (drug, envir, food or latex).      Past Medical History   Diagnosis Date   . Nicotine dependence      Conversion Data - ^Resolved   . Other diseases of respiratory system, not elsewhere classified      Conversion Data - ^Resolved   . Hypertension      monitored every 4 months, no medication; no cardiologist, only sees PCP   . Asthma      Past history of exercise induced   . 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 Vertigo      Steroid injection into right ear 12/17/2011      Past Surgical History   Procedure Laterality Date   . Lumbar fusion       Lumbar Vertebral Fusion Conversion Data    . Spinal fusion  1999     L5-S1   . Tympanostomy tube placement     . Left meniscectomy  01/2012     Medial & Lateral        Specialty Problems       Ophthalmology Problems    Pigmentary dispersion syndrome        Myopia with astigmatism               ROS    Negative for: Constitutional, Gastrointestinal, Neurological, Skin,   Genitourinary, Musculoskeletal, HENT, Endocrine, Cardiovascular, Eyes,   Respiratory, Psychiatric, Allergic/Imm, Heme/Lymph         Objective:     Base Eye Exam       Visual Acuity    Right Left   Dist cc 20/25 -1/+1 20/20 -1   Near cc J1+ J1+   Method: Snellen - Linear   Correction: Glasses   Tonometry    Right Left   Pressure 16 16   Method: Applanation   Time: 12:18 PM   Pupils    Pupils APD   Right PERRLA None   Left PERRLA None   Visual Fields    Left Right   Result Full Full   Extraocular Movement    Right Left   Result Full Full   Neuro/Psych  Oriented x3: Yes   Mood/Affect: Normal      Additional Tests       Amsler    Right Left   Amsler Normal Normal      Slit Lamp and Fundus Exam       External Exam    Right Left   External Normal ocular adnexae, lacrimal gland & drainage, orbits Normal ocular adnexae, lacrimal gland & drainage, orbits   Slit Lamp Exam    Right Left   Conjunctiva/Sclera trace papillae trace papillae   Cornea endo pigment, k-spindle endo pigment, k-spindle      Refraction       Manifest Refraction    Sphere Cylinder Axis Dist   Right -2.50 -1.50 095 20/25+2   Left -2.50 -2.00 090 20/25+1   Final Rx    Sphere Cylinder Axis   Right -2.50 -1.50 095   Left -2.50 -2.00 090   Expiration Date: 12/31/2013   Comments: OD 20/20  OS 20/20        Contact Lens Exam       Current Contact Lens Rx    Brand Base Curve Sphere Cylinder Axis Diameter   Right [employer]ic 8.70 -2.00 -1.25 100 14.5   Left [employer] 8.70 -2.00 -1.25 080 14.5         Final Rx    Sphere Cylinder Axis   Right -2.50 -1.50 095   Left -2.50 -2.00 090   Expiration Date: 12/31/2013   Comments: OD 20/20  OS 20/20                  No annotated images are attached to the encounter.      Assessment/Plan:     1. Pigmentary dispersion syndrome    2. Myopia with astigmatism    3. Wears contact lenses         PLAN:    1. Monitor  2. Manifest Refraction  given  3. Soft Contact Lens prescription given

## 2013-02-08 ENCOUNTER — Encounter: Payer: Self-pay | Admitting: Primary Care

## 2013-02-10 ENCOUNTER — Ambulatory Visit: Payer: Self-pay | Admitting: Primary Care

## 2013-03-23 ENCOUNTER — Encounter: Payer: Self-pay | Admitting: Gastroenterology

## 2013-03-25 ENCOUNTER — Other Ambulatory Visit: Payer: Self-pay | Admitting: Primary Care

## 2013-04-26 ENCOUNTER — Encounter: Payer: Self-pay | Admitting: Primary Care

## 2013-04-26 ENCOUNTER — Ambulatory Visit: Payer: Self-pay | Admitting: Primary Care

## 2013-04-26 VITALS — BP 120/78 | HR 76 | Ht 68.0 in | Wt 191.0 lb

## 2013-04-26 DIAGNOSIS — E559 Vitamin D deficiency, unspecified: Secondary | ICD-10-CM

## 2013-04-26 DIAGNOSIS — I10 Essential (primary) hypertension: Secondary | ICD-10-CM

## 2013-04-26 DIAGNOSIS — G43909 Migraine, unspecified, not intractable, without status migrainosus: Secondary | ICD-10-CM

## 2013-04-26 DIAGNOSIS — J309 Allergic rhinitis, unspecified: Secondary | ICD-10-CM

## 2013-04-26 NOTE — Progress Notes (Signed)
Chief Complaint   Patient presents with   . Hypertension   . Abnormal Lab     Vit D def   . Allergic Rhinitis       Current Outpatient Prescriptions   Medication Sig Dispense Refill   . topiramate (TOPAMAX) 25 MG tablet Take 3 Tablet Every Morning       . topiramate (TOPAMAX) 100 MG tablet Take 100 mg by mouth at bedtime       . Cholecalciferol 5000 UNITS TABS Take 1 tablet by mouth daily       . Ferrous Sulfate (IRON) 28 MG TABS Take 1 tablet by mouth daily       . clonazePAM (KLONOPIN) 0.5 MG tablet Take 0.5 mg by mouth 2 times daily   MDD 1 mg       . Multiple Vitamin (MULTIVITAMIN) TABS Take 1 tablet by mouth daily       . vitamin B-12 (CYANOCOBALAMIN) 250 MCG tablet Take 250 mcg by mouth daily       . nortriptyline (PAMELOR) 10 MG capsule Take 3 Tablets Every Evening         No current facility-administered medications for this visit.      Medications reviewed and no changes made     HYPERTENSION MANAGEMENT:     BP 120/78  Pulse 76  Ht 1.727 m (5\' 8" )  Wt 86.637 kg (191 lb)  BMI 29.05 kg/m2        PATIENT DENIES:  [x]  Chest Pain. [x]  Palpitations. [x]  Shortness of Breath.    SYMPTOMS: Less intense and frequent migraines since being on Nortriptyline/Topamax. Sleeps well but is on call for work and had a baby 4 months ago    HABITS:   --Sodium-   [] Yes  [x] No          --Exercise-  [x] Yes jogs 1.5 miles 3x week  [] No          --Smoking-  [] Yes  [x] No          --Caffeine Use- [] Yes   [x] No             HOME BLOOD PRESSURE: not taking    He has low Vit D 24 08/2012.  He had increased Vit D to 5000 units/d since then, but never did his repeat Vit D level.  He drinks Almond or Soy milk. He is Vegan.     Allergies not active.  Had allergy shots in the past and has not had problems since.  He knows he is allergic to dust mites/dog hair.  He has a house cleaner and he does frequently change the furnace filter.  He has a dog    EXAM:    EYES:  No scleral injection or tearing  NOSE:  No rhinitis  HEART: Regular rate and  rhythm, no murmurs gallops or rubs.  LUNGS: Clear to auscultation bilaterally.      ASSESSMENT/PLAN:    1.  ZOX:[id number] well-controlled and at goal of less than 140/90.  Diet and exercise reviewed.  Currently on no medicine  2.  Vitamin D deficiency: Patient was reminded to check his vitamin D level.  Continue current vitamin D dose for now.  3.  Allergic rhinitis: Asymptomatic since having allergy shots.  Not requiring any OTC medication.  Environmental measures reviewed.  4.  Migraine headaches: Less frequent and intense on Topamax/nortriptyline-patient does drink adequate water.  Followup with Dr. Loreta Ave    RETURN: 3 months HTN & 01/2014 CME

## 2013-07-04 ENCOUNTER — Encounter: Payer: Self-pay | Admitting: Primary Care

## 2013-07-04 ENCOUNTER — Ambulatory Visit: Payer: Self-pay | Admitting: Primary Care

## 2013-07-04 VITALS — BP 110/70 | HR 72 | Ht 69.25 in | Wt 190.0 lb

## 2013-07-04 DIAGNOSIS — I10 Essential (primary) hypertension: Secondary | ICD-10-CM

## 2013-07-04 DIAGNOSIS — G43909 Migraine, unspecified, not intractable, without status migrainosus: Secondary | ICD-10-CM

## 2013-07-04 DIAGNOSIS — J309 Allergic rhinitis, unspecified: Secondary | ICD-10-CM

## 2013-07-04 DIAGNOSIS — J45909 Unspecified asthma, uncomplicated: Secondary | ICD-10-CM

## 2013-07-04 DIAGNOSIS — Z789 Other specified health status: Secondary | ICD-10-CM

## 2013-07-04 NOTE — H&P (Signed)
History and Physical    HISTORY:  Chief Complaint   Patient presents with   . Annual Exam         History of Present Illness:    HPI Comments: No complaints      Problems:  Patient Active Problem List   Diagnosis Code   . Right Medial Collateral Ligament Tear 3/05    . Attention-deficit / Hyperactivity Disorder 314.01   . Right Medial Meniscus Tear 3/05    . Allergic Rhinitis 477.9   . Herniated Lumbar Disc 722.10   . Lumbar Canal Stenosis 724.02   . Right Hearing Loss 389.9   . History of Hypertension 401.9   . Right Tinnitus 388.30   . Reactive airway disease 493.90   . LBP (low back pain) 724.2   . 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 Pigmentary dispersion syndrome 364.53   . Migraine headache 346.90   . Vegan Diet V49.89   . Myopia with astigmatism 367.1, 367.20   . Wears contact lenses V41.0        Past Medical/Surgical History:   Past Medical History   Diagnosis Date   . Nicotine dependence      Conversion Data - ^Resolved   . Other diseases of respiratory system, not elsewhere classified      Conversion Data - ^Resolved   . Hypertension      monitored every 4 months, no medication; no cardiologist, only sees PCP   . 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 Vertigo      Steroid injection into right ear 12/17/2011     Past Surgical History   Procedure Laterality Date   . Lumbar fusion       Lumbar Vertebral Fusion Conversion Data    . Spinal fusion  1999     L5-S1   . Tympanostomy tube placement     . Left meniscectomy  01/2012     Medial & Lateral         Allergies:  No Known Allergies (drug, envir, food or latex)    Current medications:    Current Outpatient Prescriptions   Medication Sig   . topiramate (TOPAMAX) 25 MG tablet Take 3 Tablet Every Morning   . topiramate (TOPAMAX) 100 MG tablet Take 100 mg by mouth at bedtime   . Cholecalciferol 5000 UNITS TABS Take 1 tablet by mouth daily   . Ferrous Sulfate (IRON) 28 MG TABS Take 1 tablet by mouth daily   . clonazePAM (KLONOPIN) 0.5 MG tablet Take 0.5  mg by mouth 2 times daily   MDD 1 mg   . Multiple Vitamin (MULTIVITAMIN) TABS Take 1 tablet by mouth daily   . vitamin B-12 (CYANOCOBALAMIN) 250 MCG tablet Take 250 mcg by mouth daily   . nortriptyline (PAMELOR) 10 MG capsule Take 3 Tablets Every Evening       Family History:    Family History   Problem Relation Age of Onset   . Conversion Other      [id number] Health Status^^Active^Father(A)HTN.Chol.Allergies Mother(A)Migraines 1 Brother(A)ADD 2 Sisters(A) 1 Daughter(A) PatGF(A)CAD.Glaucoma  DM2.Chol PatGM(D)49.MI MatGF(D)60s.Bone Ca MatGM(A)   . Hearing loss Maternal Grandmother    . High cholesterol Father    . Hypertension Father        Social/Occupational History:   History     Social History   . Marital Status: Married     Spouse Name: N/A     Number of Children: 2   . Years of  Education: N/A     Occupational History   . [employer]      Social History Main Topics   . Smoking status: Former Smoker -- 1.00 packs/day for 6 years     Types: Cigarettes     Quit date: 12/07/2005   . Smokeless tobacco: Never Used      Comment: Quit 2007   . Alcohol Use: 0.0 oz/week      Comment: Social use   . Drug Use: No   . Sexually Active: Yes -- Male partner(s)     Other Topics Concern   . None     Social History Narrative   . None         Review of Systems:    Review of Systems   Constitutional: Positive for malaise/fatigue (due to work hours/young kids). Negative for fever, chills and weight loss.        Normal appetite.  No night sweats.   HENT: Positive for hearing loss (right) and tinnitus (right). Negative for ear pain, nosebleeds, congestion, sore throat and neck pain.         No rhinitis   Eyes: Negative for blurred vision, double vision, pain, discharge and redness.        Wears glasses/contacts   Respiratory: Negative for cough, hemoptysis, shortness of breath and wheezing.    Cardiovascular: Positive for chest pain (on and off for 2 months, worse with deep breath and sharp at times. ).  Negative for palpitations, orthopnea and leg swelling.   Gastrointestinal: Negative for heartburn, nausea, vomiting, abdominal pain, diarrhea, constipation, blood in stool and melena.        No Dysphagia   Genitourinary: Negative for dysuria, urgency, frequency, hematuria and flank pain.        No Nocturia.  No penile sores/discharge.  No testicle pain/masses.  No hernias.  No erectile dysfunction.   Musculoskeletal: Positive for back pain (lumbar). Negative for myalgias, joint pain and falls.   Skin: Negative for itching and rash.        No mole changes   Neurological: Positive for dizziness (1-2x year), tingling (occasional in toes bilaterally) and headaches (less on Topamax-has ~2-3 days a week). Negative for sensory change, focal weakness, loss of consciousness and weakness.   Endo/Heme/Allergies: Negative for polydipsia. Does not bruise/bleed easily.        No adenopathy.   Psychiatric/Behavioral: Negative for depression and memory loss. The patient is not nervous/anxious and does not have insomnia.    NUTRITION:     -Caffeine: none   -Salt: none   -Cholesterol: no eggs/fast food/junk food. Drinks Soy milk   -Calcium:    SAFETY:     -Seatbelt: yes   -Safe Sex: none    SELF TESTICLE EXAM (MALE): yes    EXERCISE: jogs 3-4x week    PROXY: has one         Vital Signs:   BP 110/70  Pulse 72  Ht 1.759 m (5' 9.25")  Wt 86.183 kg (190 lb)  BMI 27.85 kg/m2    EKG:  NSR  69.  Old  Small Q/inverted T III.  No change from 09/02/10    PFTs:  Normal      PHYSICAL EXAM:  Physical Exam   Constitutional: He is oriented to person, place, and time. He appears well-developed and well-nourished. No distress.   HENT:   Head: Normocephalic.   Right Ear: Tympanic membrane, external ear and ear canal normal. Decreased hearing is  noted.   Left Ear: Hearing, tympanic membrane, external ear and ear canal normal.   Nose: Nose normal.   Mouth/Throat: Uvula is midline, oropharynx is clear and moist and mucous membranes are normal. No oral  lesions. Normal dentition. No oropharyngeal exudate.   Eyes: Conjunctivae, EOM and lids are normal. Pupils are equal, round, and reactive to light. Right eye exhibits no discharge. Left eye exhibits no discharge. No scleral icterus.   Neck: Normal range of motion. Neck supple. Carotid bruit is not present. No mass and no thyromegaly present.   Cardiovascular: Normal rate, regular rhythm, normal heart sounds and intact distal pulses.  Exam reveals no gallop and no friction rub.    No murmur heard.  Pulmonary/Chest: Effort normal and breath sounds normal. No respiratory distress. He has no wheezes. He has no rales.   Abdominal: Soft. Bowel sounds are normal. He exhibits no distension, no abdominal bruit, no pulsatile midline mass and no mass. There is no splenomegaly or hepatomegaly. There is no tenderness. There is no rebound and no guarding. No hernia. Hernia confirmed negative in the right inguinal area and confirmed negative in the left inguinal area.   Genitourinary: Prostate normal, testes normal and penis normal.   Musculoskeletal: Normal range of motion. He exhibits no edema and no tenderness.   Neurological: He is alert and oriented to person, place, and time. He displays normal reflexes. No cranial nerve deficit. He exhibits normal muscle tone. He displays a negative Romberg sign. Coordination and gait normal.   Skin: Skin is warm and dry. No rash noted. No cyanosis. Nails show no clubbing.        No mole changes.    Tinea versicolor upper chest/breasts    Psychiatric: He has a normal mood and affect.           Assessment:    [name] was seen today for annual exam.    Diagnoses and associated orders for this visit:    Annual physical exam  - EKG 12 lead; Future  - Spirometry (In Office); Future  - Comprehensive metabolic panel; Future  - Lipid panel; Future  - CBC and differential; Future  - Vitamin B12; Future  - Vitamin d 25 hydroxy, d2&d3; Future  - Iron; Future  - Urinalysis with microscopic; Future    HTN  (hypertension)  - EKG 12 lead; Future    Reactive airway disease  - Spirometry (In Office); Future  - Comprehensive metabolic panel; Future    Allergic rhinitis  - Comprehensive metabolic panel; Future    Migraine  - Comprehensive metabolic panel; Future    Vegetarian diet  - Vitamin B12; Future  - Vitamin d 25 hydroxy, d2&d3; Future  - Iron; Future         .      Plan:          Assessment/Plan:    1.  History of hypertension: Patient's blood pressure has been normal.  Continue diet and exercise.  2.  History of reactive airway disease: Asymptomatic.  PFTs today were normal  3.  Right Mnire's disease: Presently stable.  He has been followed by ENT  4.  Migraine headaches: Less since starting Topamax.  He also takes nortriptyline. Followup with Dr. Loreta Ave  5.  Allergic rhinitis: Presently stable.  6. Vegan diet: Will check screening labs below  7.  Chronic low back pain: Patient has had prior back surgery.  He also has lumbar stenosis and herniated discs.  He is presently stable.  Avoid triggering activities/positions.  He is trying to avoid another surgery.  8.  History of ADHD: Presently stable off of medication  9.  Tinea versicolor upper chest: Patient will use Selsun Blue shampoo as well as Lamisil cream.  If not better/worse, he will call Dr. Ruthe Mannan for an appointment.  10.  Atypical chest pain: EKG was stable.  Possibly related to stress/musculoskeletal.  Call if worse  11.  Labs: SMAC/FLP/CBC with differential/vitamin B12/vitamin D/iron/UA    RETURN: 6 months HTN

## 2013-10-12 ENCOUNTER — Ambulatory Visit: Payer: Self-pay

## 2013-12-12 ENCOUNTER — Encounter: Payer: Self-pay | Admitting: Orthopedic Surgery

## 2013-12-12 ENCOUNTER — Ambulatory Visit: Payer: Self-pay | Admitting: Orthopedic Surgery

## 2013-12-12 VITALS — BP 135/76 | Ht 69.0 in | Wt 190.0 lb

## 2013-12-12 DIAGNOSIS — M545 Low back pain, unspecified: Secondary | ICD-10-CM

## 2013-12-12 DIAGNOSIS — Z981 Arthrodesis status: Secondary | ICD-10-CM

## 2013-12-12 NOTE — Progress Notes (Signed)
[name] returns the office for ongoing complaints of low back pain.  [name] was last seen by this office in 2011 for back pain and was diagnosed as having a disc herniation above the level of his fusion.  [name] had a Gill-type laminectomy of L5 with an instrumented fusion L5-S1 performed 15 years ago as an adolescent for spondylolisthesis.  At the time of his last visit with Dr. Isabell Jarvisubery extension of the fusion was discussed.  [name] elected to her old on additional surgery as [name] did not feel that symptoms were severe enough to warrent surgical intervention.  [name] has done relatively well with tolerable levels of pain.    Patient reports that 2 weeks ago pain in his low back significantly worsened.  There is no radiculopathy or neurologic symptoms involving the lower extremities.  Other than paddle tennis, [name] does not exercise regularly.  [name] does not perform any type of core strengthening.  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  [name] has not been using any medication and has never found NSAIDs to be helpful.    Physical examination:  Patient is a well-developed well-nourished male who ambulates easily.  [name] does not present in any high degree of pain.  [name] has a well-healed lumbar incision.  Forward flexion and extension are performed easily.  Motor and sensation is intact    Imaging studies:  New lumbar x-rays are obtained today in the office.  There is evidence of an instrumented fusion at L5-S1.  Implants are well positioned.  Degenerative disc changes are noted at the level above the fusion at L4-5.  When compared to x-rays taken in 2012, there has been progression of deterioration of the disc space.    The patient and I had a nice discussion regarding his current complaints.  While there has been some progression of his degenerative disc disease, [name] has really had no conservative care to date.  I recommended that  [name] restart his naproxen 500 mg twice a day.  [name] will call me when his schedule  allows him to participate in some focused physical therapy through our sports medicine program.  I would recommend these conservative measures prior to obtaining any additional imaging and certainly prior to any discussion of surgery.

## 2014-01-16 ENCOUNTER — Ambulatory Visit: Payer: Self-pay | Admitting: Primary Care

## 2014-06-15 ENCOUNTER — Ambulatory Visit: Payer: Self-pay | Admitting: Optometry

## 2014-06-15 ENCOUNTER — Encounter: Payer: Self-pay | Admitting: Optometry

## 2014-06-15 DIAGNOSIS — H52203 Unspecified astigmatism, bilateral: Secondary | ICD-10-CM

## 2014-06-15 DIAGNOSIS — Z973 Presence of spectacles and contact lenses: Secondary | ICD-10-CM

## 2014-06-15 DIAGNOSIS — H5213 Myopia, bilateral: Secondary | ICD-10-CM

## 2014-06-15 DIAGNOSIS — H21233 Degeneration of iris (pigmentary), bilateral: Secondary | ICD-10-CM

## 2014-06-15 NOTE — Progress Notes (Signed)
Outpatient Visit      Patient name: [name]  DOB: 10-Jun-1976       Age: 38 y.o.  MR#: [id number]    Encounter Date: 06/15/2014    Subjective:    No chief complaint on file.    HPI    >1 yr f/u - Pt ran out of CL's about 2 months ago  Hx: pigmentary dispersion syndrome  Hx: Myopia with astigmatism    Pt does not notice any problems with his contacts or glasses. Would like   to get more contacts.        has a current medication list which includes the following prescription(s): topiramate, topiramate, cholecalciferol, iron, clonazepam, multivitamin, vitamin b-12, and nortriptyline.     has No Known Allergies (drug, envir, food or latex).      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    Vertigo      Steroid injection into right ear 12/17/2011      Past Surgical History   Procedure Laterality Date    Lumbar fusion       Lumbar Vertebral Fusion Conversion Data     Spinal fusion  1999     L5-S1    Tympanostomy tube placement      Left meniscectomy  01/2012     Medial & Lateral        Specialty Problems       Ophthalmology Problems     Pigmentary dispersion syndrome, bilateral         Myopia with astigmatism, bilateral                    Objective:     Base Eye Exam    Visual Acuity (Snellen - Linear)     Right Left   Dist cc 20/20 20/20 -1       Correction:  Glasses      Tonometry (Applanation, 8:57 AM)     Right Left   Pressure 13 13         Pupils     Pupils APD   Right PERRLA None   Left PERRLA None         Visual Fields     Left Right   Result Full Full         Extraocular Movement     Right Left   Result Full Full         Neuro/Psych    Oriented x3:  Yes    Mood/Affect:  Normal      Dilation    Both eyes:  2.5%  Phenylephrine, 1.0% Tropicamide @ 8:59 AM            Slit Lamp and Fundus Exam    External Exam     Right Left    External Normal ocular adnexae, lacrimal gland & drainage, orbits Normal ocular adnexae, lacrimal gland & drainage, orbits      Slit Lamp Exam     Right Left    Lids/Lashes Normal structure & position Normal structure & position    Conjunctiva/Sclera trace papillae trace papillae    Cornea endo pigment, k-spindle  endo pigment, k-spindle    Anterior Chamber Clear & deep Clear & deep    Iris Transillumination defects Normal shape, size, morphology    Lens Normal cortex, nucleus, anterior/posterior capsule, clarity Normal cortex, nucleus, anterior/posterior capsule, clarity      Fundus Exam     Right Left    Vitreous Clear Clear    Disc Normal size, appearance, nerve fiber layer Normal size, appearance, nerve fiber layer    C/D Ratio 0.5 0.6    Macula Normal Normal    Vessels Normal Normal    Periphery Normal Normal      Edited by: Loyce Dys, OD            Refraction    Wearing Rx     Sphere Cylinder Axis   Right -2.50 -1.50 095   Left -2.50 -2.00 090         Manifest Refraction     Sphere Cylinder Axis Dist   Right -2.50 -1.25 095 20/20   Left -2.50 -1.75 090 20/20         Final Rx     Sphere Cylinder Axis   Right -2.50 -1.25 095   Left -2.50 -1.75 090       Type:  SVL    Expiration Date:  06/15/2016            Contact Lens Exam    Current Contact Lens Rx     Brand Base Curve Diameter Sphere Cylinder Axis   Right [employer] 8.70 14.5 -2.00 -1.25 100   Left [employer] 8.70 14.5 -2.00 -1.25 080         Final Contact Lens Rx     Brand Base Curve Diameter Sphere Cylinder Axis   Right [employer] 8.70 14.5 -2.50 -1.25 100   Left [employer] 8.70 14.5 -2.50 -1.25 080       Expiration Date:  06/16/2015    Replacement:  Every 2 weeks              Final Rx     Sphere Cylinder Axis   Right -2.50 -1.25 095   Left -2.50 -1.75 090       Type:  SVL    Expiration Date:   06/15/2016        Final Contact Lens Rx     Brand Base Curve Diameter Sphere Cylinder Axis   Right [employer] 8.70 14.5 -2.50 -1.25 100   Left [employer] 8.70 14.5 -2.50 -1.25 080       Expiration Date:  06/16/2015    Replacement:  Every 2 weeks              No annotated images are attached to the encounter.      Assessment/Plan:      1. Myopia with astigmatism, bilateral    2. Pigmentary dispersion syndrome, bilateral    3. Wears contact lenses         PLAN:    1. Manifest Refraction given  2. No evidence of glc. OCT stable from 2013. Continue to monitor.  rec yearly eye exams  3. Soft Contact Lens prescription given

## 2014-06-19 ENCOUNTER — Encounter: Payer: Self-pay | Admitting: Orthopedic Surgery

## 2014-06-19 ENCOUNTER — Ambulatory Visit: Payer: Self-pay | Admitting: Orthopedic Surgery

## 2014-06-19 VITALS — BP 133/77 | HR 80 | Ht 69.0 in | Wt 198.0 lb

## 2014-06-19 DIAGNOSIS — M545 Low back pain, unspecified: Secondary | ICD-10-CM

## 2014-06-19 MED ORDER — CELECOXIB 200 MG PO CAPS *I*
200.0000 mg | ORAL_CAPSULE | Freq: Two times a day (BID) | ORAL | Status: DC
Start: 2014-06-19 — End: 2015-03-06

## 2014-06-19 NOTE — Progress Notes (Signed)
Subjective:  38 y.o. male returns for follow-up of:  Low back pain with intermittent bilateral leg symptoms.  Back pain is far worse than his leg pain.  He was last seen by Dr. Isabell Jarvisubery in 2013 for back pain.  Workup revealed a disc herniation above the level of his fusion.  He had an instrumented fusion L5-S1 15 years ago as an adolescent for spondylolisthesis.  At the time of his last visit with Dr. Isabell Jarvisubery additional fusion surgery was discussed.  He was last seen by our office 6 months ago for recurrent back pain.    Patient report  Treatment since last Visit:  Naproxen.  He has been unable to start physical therapy as was advised to to his busy work schedule and family obligations.  Treatment to date has been:  He did not fine the naproxen to be particularly helpful.  He has tried meloxicam in the past.  When the pain is very severe, he uses left over to Dilaudid from his 2013 disc herniation.  Current Chief Complaint:  Low back pain.  Patient reports that pain is quite severe when he first gets out of bed in the morning.  When he first arises to a neutral position.  He has a sharp 10 out of 10 pain that takes his breath away.  Pain tends to be worse with physical activity, especially when he is caring for his young children.  Intermittently he has pain and numbness going down both legs.        Interval Health History changes:none  Relevant New Positives on 14 point Spine Related Review of Systems: None   Focused Exam Update:  Motor and sensation are intact    Imaging:        No new imaging    Recommendations:  Treatment Options:  Patient has agreed to work with our Active Spine program in sports medicine and concentrate on core strengthening, as this may be worthwhile in providing him with some improvement in his overall pain level.  We agreed to a trial of Celebrex.  I do not support the use of Dilaudid, and discouraged him from using narcotics for management of his back pain.    Follow-up:  Patient will contact  me with an update in 4-6 weeks.  If his symptoms do not improve he will likely require reimaging and a visit with Dr. Isabell Jarvisubery

## 2014-07-10 ENCOUNTER — Ambulatory Visit: Payer: Self-pay

## 2014-07-10 DIAGNOSIS — M549 Dorsalgia, unspecified: Secondary | ICD-10-CM

## 2014-07-10 NOTE — Progress Notes (Signed)
[facility]  LUMBAR SPINE EVALUATION      Diagnosis: back pain s/p L5-S1 fusion Adjacent segment deg change L4-L5   Onset date: Chronic 1.5 months ago he had a flare-up.   Date of surgery:      SUBJECTIVE    [name] is a 38 y.o. male who is present today for back care. He reports he can't do what he wants to do without back pain including; running over 1 mile, he can only tolerate elliptical for 25 minutes, swimming gives him vertigo issues.   Mechanism of injury/history of symptoms: No specific cause His recent flare up included a sharp pain when he would stand up. Getting in and out of the car is difficult.     Night Pain: unsure, he doesn't sleep well, taking a few meds.    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   Standing for a really long time causes testicle pain.   Relevant symptoms: Burning, Numbness, Tingling, Sharp, Pain    Symptom frequency: Constant back pain, leg pain is intermittent and fluctuating   Symptom intensity (0 - 10 scale): Now 5-6 Best 4 Worst 8    Symptoms worsen with: prolonged bending forward. Lifting. Running.    Symptoms improve with: Lying, on floor with feet up   Assistive device:  none  Patients goals for therapy: Return to sport, Reduce pain, avoid an additional surgery      OBJECTIVE    Observation  Patient was able to ambulate into the department with a normal gait pattern.   The patient was able to perform basic bed mobility independent.    Movement Screen   The patient was able to perform heel walking and was able to perform toe walking during the examination.  The patient was able to squat to the floor during the examination.     Palpation  Generalized lumbar soreness      ROM Loss  Flexion: significant loss, pain returning to upright on left side  Extension:  minor loss, left sided LBP  Left Sidebend: no loss in motion, minor left sided pain   Right Sidebend:  no loss in motion, no pain   Left Rotation:  no loss in motion  Right Rotation:  no loss in motion    Postural Correction  Sitting Correction: symptoms reduced:  no  Standing Correction: symptoms reduced:  NT    Repeated Movement Testing  Repeated flexion had no effect on the patients symptoms   He reported it "feels good" but no decrease in axial or radicular pain.     Directional Preference:  Neutral    Neuromuscular Assessment  Sensation:  LE  Light touch, Intact to gross screen  Myotomes:  LE Intact to gross screen  Good hip rotator strength    Special Tests  Lumbar:  Straight Leg Raise,  Right LE  negative, Left LE   negative  Sacroilliac  Joint:  NA   Cervical Spine:  NA                       Flexibility:   Hamstrings: Minor tightness  Quads: Minor tightness      Dynamic Stability Tests  Bridge:  pass  / Glutes: active  Hamstrings: inactive  Abdominal Wall:  active  Plank Position: Toes  pass  Right Side Plank Position:Toes  pass  Left Side Plank Position:  Toes  pass            ASSESSMENT    McGill Stage:  Core and whole body stability    Findings consistent with 38 y.o. male with adjacent segment degeneration s/p lumbar fusion with pain, ROM limitations, strength limitations, functional limitations    Prognosis:  Good    Contraindications/Precautions/Limitation:  Per protocol and Per diagnosis    Short Term Goals:5527month(s) Decrease pain to 0/10 , Increase ROM  WNL, Increase strength  WNL  and Minimal assistance with  HEP/ education concepts   Long Term Goals:5739month(s) Patient demonstrates improved functional core strength, Patient will return to full prior level of function  , Independent with symptom management    TREATMENT PLAN  Patient/family involved in developing goals and treatment plan: Yes    Recommended Treatment Freq 1-2 times per month(s) for 2 month(s)    Treatment plan inclusive of:   Exercise:AROM, AAROM, PROM, Stretching, Progressive Resistive, Aerobic exercise   Manual Techniques:Joint mobilization    Modalities:  Ther Exercise per flowsheet   Functional: Proprioception/Dynamic stability, Functional rehab    Patient was educated on how the principles of the Active Spine Program will benefit their condition.  Patient was educated on the benefits of maintaining an active lifestyle. The patient was instructed on core strengthening exercises for spine support and stability; flexibility exercise for the upper and lower extremities to reduce movement stress to the spine; and aerobic exercise for weight management and endurance.  The patient was also educated on proper posture and body mechanics to avoid painful movements and to work on any directional preference positions.  Modalities for pain control, and traction will be used as necessary to benefit the patients symptoms.          Patient was instructed how to gradually progress their exercise routine within their range of tolerable symptoms.  Patient was advised that maintenance and consistency with the exercise routine at home is key to success.  And that it is critical that they notify their therapist of any prescribed activity that increases their symptoms so that the routine may be modified and progressed.      Thank you for the referral of this [facility].    [name], PT    Aquatic jogging suggested   Curl-up  3x   bridge 3x   Front plank 10''   Side plank 10''                        Prone quad stretch 3x30''   Hamstring  stretch hep   Chair thoracic ext  2''x3       CT/Thoracic distraction Trial next

## 2014-07-29 ENCOUNTER — Emergency Department
Admission: EM | Admit: 2014-07-29 | Disposition: A | Discharge status: Home / Self Care | Payer: Self-pay | Source: Ambulatory Visit | Attending: Emergency Medicine | Admitting: Emergency Medicine

## 2014-07-29 ENCOUNTER — Encounter: Payer: Self-pay | Admitting: Psychiatry

## 2014-07-29 LAB — CBC AND DIFFERENTIAL
Baso # K/uL: 0.1 10*3/uL (ref 0.0–0.1)
Basophil %: 0.7 %
Eos # K/uL: 0.1 10*3/uL (ref 0.0–0.5)
Eosinophil %: 1.7 %
Hematocrit: 44 % (ref 40–51)
Hemoglobin: 14.9 g/dL (ref 13.7–17.5)
IMM Granulocytes #: 0 % (ref 0.0–0.1)
IMM Granulocytes: 0.4 %
Lymph # K/uL: 3.2 10*3/uL (ref 1.3–3.6)
Lymphocyte %: 38.5 %
MCH: 31 pg/cell (ref 26–32)
MCHC: 34 g/dL (ref 32–37)
MCV: 93 fL — ABNORMAL HIGH (ref 79–92)
Mono # K/uL: 0.8 10*3/uL (ref 0.3–0.8)
Monocyte %: 9.4 %
Neut # K/uL: 4.1 10*3/uL (ref 1.8–5.4)
Platelets: 260 10*3/uL (ref 150–330)
RBC: 4.7 MIL/uL (ref 4.6–6.1)
RDW: 12 % (ref 11.6–14.4)
Seg Neut %: 49.3 %
WBC: 8.2 10*3/uL (ref 4.2–9.1)

## 2014-07-29 LAB — URINALYSIS WITH MICROSCOPIC
Blood,UA: NEGATIVE
Ketones, UA: NEGATIVE
Leuk Esterase,UA: NEGATIVE
Nitrite,UA: NEGATIVE
Protein,UA: NEGATIVE mg/dL
RBC,UA: NONE SEEN /hpf (ref 0–2)
Specific Gravity,UA: 1.018 (ref 1.002–1.030)
WBC,UA: <1 /hpf (ref 0–5)
pH,UA: 6 (ref 5.0–8.0)

## 2014-07-29 LAB — BASIC METABOLIC PANEL
Anion Gap: 13 (ref 7–16)
CO2: 29 mmol/L — ABNORMAL HIGH (ref 20–28)
Calcium: 9.5 mg/dL (ref 9.0–10.3)
Chloride: 100 mmol/L (ref 96–108)
Creatinine: 1.01 mg/dL (ref 0.67–1.17)
GFR,Black: 109 *
GFR,Caucasian: 95 *
Glucose: 88 mg/dL (ref 60–99)
Lab: 23 mg/dL — ABNORMAL HIGH (ref 6–20)
Potassium: 4.4 mmol/L (ref 3.3–5.1)
Sodium: 142 mmol/L (ref 133–145)

## 2014-07-29 LAB — HM HIV SCREENING OFFERED

## 2014-07-29 LAB — HOLD GREEN WITH GEL

## 2014-07-29 MED ORDER — KETOROLAC TROMETHAMINE 30 MG/ML IJ SOLN *I*
15.0000 mg | Freq: Once | INTRAMUSCULAR | Status: AC
Start: 2014-07-29 — End: 2014-07-29
  Administered 2014-07-29: 15 mg via INTRAVENOUS
  Filled 2014-07-29: qty 1

## 2014-07-29 MED ORDER — SODIUM CHLORIDE 0.9 % IV SOLN WRAPPED *I*
100.0000 mL/h | Status: DC
Start: 2014-07-29 — End: 2014-07-30

## 2014-07-29 NOTE — ED Notes (Signed)
Pt with urinary frequency and testicle  Pain x's 3 days.  Denies blood in UA, foul smell.  Denies h/o UTI's, stones.  H/o spinal fusion.  Sent in by MD for evaluation.  IV placed, l;abs drawn.  Will continue to monitor per MD orders.

## 2014-07-29 NOTE — ED Notes (Signed)
C/o back pain, lower abdominal pain and urinary frequency x 2 days. Denies incontinence or dysuria. Hx of spinal fusion in 1999 and ruptured disk in 2013.

## 2014-07-29 NOTE — ED Provider Progress Notes (Signed)
ED Provider Progress Note    Patient presented to the ED with urinary frequency and a burning sensation in his testicles. Patient's blood work and UA returned unremarkable. His bladder scan post void was 0ml in his bladder. Patient will be discharged home with instructions to follow up with his PCP.        Gearldine Bienenstock, DO, 07/29/2014, 9:24 PM

## 2014-07-29 NOTE — ED Notes (Signed)
Patient discharged per MD order, verbalized an understanding and need for follow up PCP and urology care. Patient IV access removed, patient tolerated well. Ambulated off unit, steady gait. No further needs.

## 2014-07-29 NOTE — ED Notes (Signed)
Patient's post void residual noted to be 0 ml in bladder.

## 2014-07-29 NOTE — ED Provider Notes (Addendum)
History     Chief Complaint   Patient presents with    Urinary Frequency       HPI Comments: 38 y/o caucasian man with PMHx hx of L5-S1 spinal fusion (1999), herniated L4-L5 disc (2012), HTN, Meniere's disease, and migraines presents to the ED with c/o increased urinary frequency for the past 3 days. States that frequency occurred suddenly three days ago and that it does not coincide with urgency. Patient admits to decreased urine output and burning sensation in his testicles that occurs intermittenly . States that pain in his testicles improves when he lays down. Patient also endorses chronic back pain, left leg pain with numbness in the shin, and burning in his right toes since 2012 when he herniated his L4-L5 discs. States that this pain has been slightly worse over the past few days but that his urinary frequency is the reason why he came to the ED. Also reports that his BM's have been regular but smaller in size than usual and that he has some mild tenderness in his LLQ.       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    Vertigo      Steroid injection into right ear 12/17/2011            Past Surgical History   Procedure Laterality Date    Lumbar fusion       Lumbar Vertebral Fusion Conversion Data     Spinal fusion  1999     L5-S1    Tympanostomy tube placement      Left meniscectomy  01/2012     Medial & Lateral       Family History   Problem Relation Age of Onset    Conversion Other      [id number] Health Status^^Active^Father(A)HTN.Chol.Allergies Mother(A)Migraines 1 Brother(A)ADD 2 Sisters(A) 1 Daughter(A) PatGF(A)CAD.Glaucoma  DM2.Chol PatGM(D)49.MI MatGF(D)60s.Bone Ca  MatGM(A)    Hearing loss Maternal Grandmother     High cholesterol Father     Hypertension Father          Social History      reports that he quit smoking about 8 years ago. His smoking use included Cigarettes. He has a 6 pack-year smoking history. He has never used smokeless tobacco. He reports that he drinks alcohol. He reports that he currently engages in sexual activity and has had male partners. He reports that he does not use illicit drugs.    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   HENT: Positive for hearing loss (Right ear: chronic ). Negative for sore throat.    Respiratory: Negative for cough and shortness of breath.    Cardiovascular: Negative for chest pain.   Gastrointestinal: Positive for abdominal pain (tenderness in LLQ but states it is not painful). Negative for nausea, vomiting, diarrhea, constipation and blood in stool.   Genitourinary: Positive for frequency, decreased urine volume and testicular pain (burning). Negative for dysuria, urgency, hematuria,  discharge, scrotal swelling and penile pain.   Musculoskeletal: Positive for back pain (chronic). Negative for gait problem.   Neurological: Positive for numbness (chronic numbness in left shin). Negative for headaches.       Physical Exam     ED Triage Vitals   BP Heart Rate Heart Rate(via Pulse Ox) Resp Temp Temp Source SpO2 O2 Device O2 Flow Rate   07/29/14 1751 07/29/14 1751 -- 07/29/14 1751 07/29/14 1751 07/29/14 1751 07/29/14 1751 -- --   165/109 mmHg 66  18 36.8 C (98.2 F) Oral 100 %        Weight           --                          Physical Exam   Constitutional: He is oriented to person, place, and time. He appears well-developed and well-nourished. No distress.   HENT:   Head: Normocephalic and atraumatic.   Right Ear: External ear normal.   Left Ear: External ear normal.   Eyes: Right eye exhibits no discharge. Left eye exhibits no discharge. No scleral icterus.   Neck: Neck supple.   Cardiovascular: Normal rate and regular rhythm.    Pulmonary/Chest: Effort normal and breath sounds normal.   Abdominal: Soft. Bowel sounds are normal. He exhibits no distension. There is no tenderness.   Genitourinary: Left testis shows tenderness. Left testis shows no swelling.   Musculoskeletal: Normal range of motion.   Neurological: He is alert and oriented to person, place, and time.   Skin: Skin is warm and dry. He is not diaphoretic.       Medical Decision Making      Amount and/or Complexity of Data Reviewed  Clinical lab tests: ordered        Initial Evaluation:  ED First Provider Contact     Date/Time Event User Comments    07/29/14 1725 ED Provider First Contact Glennon Mac ANN Initial Face to Face Provider Contact          Patient seen by me today 07/29/2014 at 1930    Assessment:  38 y.o., male comes to the ED with urinary frequency for 3 days    Differential Diagnosis includes urinary frequency, r/o spinal cord compression, UTI, epididymitis, less likely testicular torsion         Plan:   -  CBC and  diff  -Chem 7  -UA  -Post-void bladder scan  -NS@100cc   -Ketorolac  x 1    Gearldine Bienenstock, DO              Gearldine Bienenstock, DO  Resident  07/29/14 2123    Gearldine Bienenstock, DO  Resident  07/29/14 2126  \  Resident Attestation:     Patient seen by me on arrival date of 07/29/2014 at 1958    History:   I reviewed this patient, reviewed the resident's note and agree.  Exam:   I examined this patient, reviewed the resident's note and agree.    Decision Making:   I discussed with the resident his/her documented decision making  and agree.      Author Elliot Gault, MD      Elliot Gault, MD  07/31/14 [phone]

## 2014-07-29 NOTE — First Provider Contact (Signed)
ED Medical Screening Exam Note    Initial provider evaluation performed by   ED First Provider Contact     Date/Time Event User Comments    07/29/14 1725 ED Provider First Contact [name] ANN Initial Face to Face Provider Contact        Patient with hx lumbar fusion 1999, now 2 days of worsening LBP, abdominal pain, called Dr Cristy Friedlander office, recommended he come in, reports urinary frequency, drove self to ED  Orders placed:  LABS, ANALGESIA and urinalysis     Patient requires further evaluation.     [name] ANN Khira Cudmore, NP, 07/29/2014, 5:25 PM

## 2014-07-29 NOTE — Discharge Instructions (Signed)
You came to the ED with a complaint increased urination frequency but decreased urine output. Your blood work and urinanalysis did not show any infection or other abnormalities. You also had a bladder scan which measures the amount of urine left over after you urinate. This showed you had no left over urine. Please make an appointment to follow up with your primary care doctor within the week. If your symptoms worsen, you develop fevers, or you lose consciousness, please return to the ED.

## 2014-08-09 ENCOUNTER — Encounter: Payer: Self-pay | Admitting: Gastroenterology

## 2014-11-27 DIAGNOSIS — M549 Dorsalgia, unspecified: Secondary | ICD-10-CM

## 2014-11-27 NOTE — Progress Notes (Signed)
Sports and Spine Rehabilitation  Discharge Summary      [name]  [id number]  11/27/2014    Diagnosis: Back pain s/p spinal surgery    SUMMARY OF TREATMENTS:  Received care for 1 rehabilitation visits    Attendance:  Poor   Compliance:  Poor    The treatment(s) included:  Home program instruction, Therapeutic exercise (ROM/flexibility/strength)    Treatment Goals:  Not Achieved  Range of Motion/Flexibility:  Not Achieved  Strength/Motor Performance:  Not Achieved  Functional Recovery:  Not Achieved  Pain Control:  Not Achieved  Home Program:  Not Achieved  Other:  NA    REASON FOR DISCHARGE:  Patient did not return for follow-up     Prognosis at time of discharge:  poor  Comments:  NA    Thank you [facility] the [facility] of this patient to [facility].    Delton PrairieElizabeth Terra Aveni, PT

## 2014-12-28 ENCOUNTER — Ambulatory Visit: Payer: Self-pay | Admitting: Optometry

## 2015-01-04 ENCOUNTER — Ambulatory Visit: Payer: Self-pay | Admitting: Optometry

## 2015-01-04 NOTE — Progress Notes (Signed)
This encounter was created in error - please disregard.

## 2015-03-06 ENCOUNTER — Ambulatory Visit: Payer: Self-pay | Admitting: Primary Care

## 2015-03-06 ENCOUNTER — Encounter: Payer: Self-pay | Admitting: Primary Care

## 2015-03-06 VITALS — BP 120/68 | HR 68 | Ht 69.0 in | Wt 210.0 lb

## 2015-03-06 DIAGNOSIS — I10 Essential (primary) hypertension: Secondary | ICD-10-CM

## 2015-03-06 DIAGNOSIS — E785 Hyperlipidemia, unspecified: Secondary | ICD-10-CM

## 2015-03-06 DIAGNOSIS — E663 Overweight: Secondary | ICD-10-CM

## 2015-03-06 NOTE — Progress Notes (Signed)
Chief Complaint   Patient presents with    Hypertension    Obesity     HYPERTENSION MANAGEMENT:     BP 120/68 mmHg   Pulse 68   Ht 1.753 m (5\' 9" )   Wt 95.255 kg (210 lb)   BMI 31.00 kg/m2        PATIENT DENIES: [x]  Headaches[x]  Palpitations. [x]  Shortness of Breath.    SYMPTOMS:  he feels overwhelmed/anxious a few times a week and can get CP.  He jogs 3 miles and has no CP    HABITS:   --Sodium-   [] Yes  [x] No          --Exercise-  [x] Yes 1-2x week  [] No          --Smoking-  [] Yes  [x] No          --Caffeine Use- [x] Yes 2 cups coffee/d  [] No             HOME BLOOD PRESSURE:  Not taking, but it was 140/78 02/14/15 Biomedical Assessment    He gained 12 lbs since 06/2014.  He stopped being a Vegan ~6 months ago.  Rare junk food.  Fast food 0-1x a month.  Pasta 1x week.  Eats wheat bread.  Likes cheese sticks (have a lot of sodium).  He eats a light breakfast (power bar/smoothie) and has 2 sandwiches at lunch (tofu Malawiturkey and PBJ). He over eats at dinner.  He denies constipation.  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 Sleeps ~8 hrs. Recent Biomedical assessment had an elevated NON-fasting lipid profile.    EXAM:    HEART: Regular rate and rhythm, no murmurs gallops or rubs.  LUNGS: Clear to auscultation bilaterally.       ASSESSMENT/PLAN:    1.  History of borderline hypertension: Blood pressure was normal today.  He is currently on no medicine.  He does have a stressful job which could influence his blood pressure.  He also is overweight.  Diet and exercise extensively reviewed.  He was advised to resume checking his blood pressure and call me if it goes higher  2.  Overweight: Healthy food choices and portion control reviewed.  Avoid overeating at dinner.  Continue exercising.  Will check a TSH/fasting blood sugar/vitamin D  3.  Elevated nonfasting lipid profile: Diet and exercise reviewed.  Will check a fasting lipid profile    RETURN: 6 months overweight/labile HTN

## 2015-04-09 ENCOUNTER — Ambulatory Visit
Admit: 2015-04-09 | Discharge: 2015-04-09 | Disposition: A | Discharge status: Home / Self Care | Payer: Self-pay | Source: Ambulatory Visit | Attending: Primary Care | Admitting: Primary Care

## 2015-04-09 DIAGNOSIS — E663 Overweight: Secondary | ICD-10-CM

## 2015-04-09 DIAGNOSIS — E785 Hyperlipidemia, unspecified: Secondary | ICD-10-CM

## 2015-04-09 LAB — TSH: TSH: 3.31 u[IU]/mL (ref 0.27–4.20)

## 2015-04-09 LAB — LIPID PANEL
Chol/HDL Ratio: 5.6
Cholesterol: 239 mg/dL — AB
HDL: 43 mg/dL
LDL Calculated: 133 mg/dL
Non HDL Cholesterol: 196 mg/dL
Triglycerides: 313 mg/dL — AB

## 2015-04-09 LAB — GLUCOSE: Glucose: 93 mg/dL (ref 60–99)

## 2015-04-11 LAB — VITAMIN D
25-OH VIT D2: <4 ng/mL
25-OH VIT D3: 24 ng/mL
25-OH Vit Total: 24 ng/mL — ABNORMAL LOW (ref 30–60)

## 2015-04-25 ENCOUNTER — Encounter: Payer: Self-pay | Admitting: Primary Care

## 2015-05-27 ENCOUNTER — Encounter: Payer: Self-pay | Admitting: Primary Care

## 2015-05-27 ENCOUNTER — Ambulatory Visit: Payer: Self-pay | Admitting: Primary Care

## 2015-05-27 ENCOUNTER — Other Ambulatory Visit: Payer: Self-pay | Admitting: Primary Care

## 2015-05-27 VITALS — BP 112/72 | HR 64 | Ht 69.02 in | Wt 204.2 lb

## 2015-05-27 DIAGNOSIS — R0789 Other chest pain: Secondary | ICD-10-CM

## 2015-05-27 DIAGNOSIS — R079 Chest pain, unspecified: Secondary | ICD-10-CM

## 2015-05-27 NOTE — Progress Notes (Signed)
Chief Complaint   Patient presents with    Chest Pain     off and on since last wednesday,gets SOB easier     Vitals:    05/27/15 0933   BP: 112/72   Pulse: 64   Weight: 92.6 kg (204 lb 3.2 oz)   Height: 1.753 m (5' 9.02")      EKG:  Sinus Brady 58.  Old inverted T III compared to 09/02/10. No acute changes.    Last week, he started having episodic non-radiating left chest tightness. He was sitting when this happened and it resolved on its own with in 5 minutes. The last 2 days, the chest tightness has been constant-has it now.  Symptoms typicall start at work.  Exercise and tearing down his deck do not bother him. No f/c/recent URI/ N/V/sweats/heartburn. He has been burping more lately however.  No SOB except with stairs. Work is more stressful the past 2 weeks-co-worker left. Patient has elevated cholesterol and is concerned symptoms may be his heart.   Patient is seeing a Psychiatrist (Dr Lynnette Caffey) weekly the past 8 weeks for adjustment disorder-has job stress/unhappiness, irritable at home.  He works up to 70 hours/week.   He is taking Klonopin 0.5mg  BID. He is biking to and from work (10 miles total).     EXAM:    AFFECT:  Pleasant and cheerful in NAD  HEART: Regular rate and rhythm, no murmurs gallops or rubs.  LUNGS: Clear to auscultation bilaterally.     ASSESSMENT/PLAN:     1.  Chest pain: Atypical.  It does not occur with exertion.  EKG has no acute changes.  Patient does have some cardiac risk factors and will like to have a stress test to reduce his anxiety.  An exercise echo will be ordered.  He will continue his Klonopin as well as his visits with his psychiatrist for counseling.  Relaxation techniques reviewed.  Patient will call me if symptoms do not improve or worsen.    RETURN: 08/2015 cholesterol/HTN

## 2015-05-30 ENCOUNTER — Ambulatory Visit
Admit: 2015-05-30 | Discharge: 2015-05-30 | Disposition: A | Discharge status: Home / Self Care | Payer: Self-pay | Source: Ambulatory Visit | Admitting: Cardiology

## 2015-05-30 ENCOUNTER — Ambulatory Visit
Admit: 2015-05-30 | Discharge: 2015-05-30 | Disposition: A | Discharge status: Home / Self Care | Payer: Self-pay | Source: Ambulatory Visit | Attending: Cardiology | Admitting: Cardiology

## 2015-05-30 DIAGNOSIS — R0789 Other chest pain: Secondary | ICD-10-CM

## 2015-05-30 LAB — EXERCISE STRESS ECHO COMPLETE
Aortic Arch Diameter: 2.7 cm
Aortic Diameter (sinus of Valsalva): 2.8 cm
BMI: 30.2 kg/m2
BP Diastolic: 80 mmHg
BP Systolic: 122 mmHg
BSA: 2.12 m2
Echo RV Stroke Work Index Estimate: 695.3 mmHg•mL/m2
Estimated workload: 12.8 METS
Heart Rate: 58 {beats}/min
Height: 69 in
LA Systolic Diameter: 3.93 cm
LV ASE Mass BSA Index: 90.9 gm/m2
LV ASE Mass Height 2.7 Index: 42.4 gm/m2.7
LV ASE Mass Height Index: 109.9 gm/m
LV ASE Mass: 192.7 gm
LV CO BSA Index: 1.83 L/min/m2
LV Cardiac Output: 3.89 L/min
LV Diastolic Volume Index: 50.9 mL/m2
LV Posterior Wall Thickness: 1.09 cm
LV SV BSA Index: 31.6 mL/m2
LV SV Height Index: 38.2 mL/m
LV Septal Thickness: 1.11 cm
LV Stroke Volume: 67 mL
LV Systolic Volume Index: 19.3 mL/m2
LVED Diameter BSA Index: 2.3 cm/m2
LVED Diameter Height Index: 2.7 cm/m
LVED Diameter: 4.78 cm
LVED Volume BSA Index: 50.9 mL/m2
LVED Volume BSA Index: 51 ml/m2
LVED Volume Height Index: 61.6 mL/m
LVED Volume: 108 mL
LVEF (Volume): 62 %
LVES Diameter BSA Index: 1.4 cm/m2
LVES Diameter Height Index: 1.7 cm/m
LVES Diameter: 2.9 cm
LVES Volume BSA Index: 19 ml/m2
LVES Volume BSA Index: 19.3 mL/m2
LVES Volume Height Index: 23.4 mL/m
LVES Volume: 41 mL
MPHR: 183 {beats}/min
Peak DBP: 90 mmHg
Peak Gradient - TR: 22 mmHg
Peak HR: 166 {beats}/min
Peak SBP: 220 mmHg
Percent MPHR: 90.7 %
Pulmonary Vascular Resistance Estimate: 5.7 mmHg
RA Pressure Estimate: 3 mmHg
RPP: 36520 BPM x mmHG
RR Interval: 1034.48 ms
RV Peak Systolic Pressure: 25 mmHg
Stress Peak Stage: 3.5
Stress duration (min): 10 min
Stress duration (sec): 30 s
Weight: 3264 oz

## 2015-05-30 MED ORDER — PERFLUTREN LIPID MICROSPHERE 1.1 MG/ML (DEFINITY) IV SUSP *I*
1.3000 mL | INTRAVENOUS | Status: DC | PRN
Start: 2015-05-30 — End: 2015-05-31

## 2015-09-03 ENCOUNTER — Encounter: Payer: Self-pay | Admitting: Primary Care

## 2015-09-03 ENCOUNTER — Ambulatory Visit: Payer: Self-pay | Admitting: Primary Care

## 2015-09-03 VITALS — BP 128/84 | HR 72 | Ht 69.0 in | Wt 202.0 lb

## 2015-09-03 DIAGNOSIS — E559 Vitamin D deficiency, unspecified: Secondary | ICD-10-CM

## 2015-09-03 DIAGNOSIS — E785 Hyperlipidemia, unspecified: Secondary | ICD-10-CM

## 2015-09-03 DIAGNOSIS — I10 Essential (primary) hypertension: Secondary | ICD-10-CM

## 2015-09-03 DIAGNOSIS — E663 Overweight: Secondary | ICD-10-CM

## 2015-09-03 NOTE — Progress Notes (Signed)
Chief Complaint   Patient presents with    Hypertension    Hyperlipidemia    Obesity     Current Outpatient Prescriptions   Medication Sig Dispense Refill    Cholecalciferol 5000 UNITS TABS Take 1 tablet by mouth daily      clonazePAM (KLONOPIN) 0.5 MG tablet Take 0.5 mg by mouth 2 times daily   MDD 1 mg      Multiple Vitamin (MULTIVITAMIN) TABS Take 1 tablet by mouth daily       No current facility-administered medications for this visit.      Medications reviewed and no changes made     He lost 8 lbs since 02/2015.  He is training for a half marathon 10/13/15.  He jogs 15-20 miles/week.  He is avoiding fast food/sweets. He gets ~1 serving of veges a day.  He used to do fruit smoothies in the morning. Eats wheat bread/pasta. He eats a large dinner.  Denies snacking. Father has high cholesterol.  He used to have elevated BP.  Father has hypertension.  Not taking BP at home.  Not using salt.  Drinks 2 cups coffee/d. He has a lot a lot of work stress that can increase BP.      He has not been taking his Vit D pill daily, takes it 2 days a week and forgets the rest.  Last Vit D level was low at 24.    EXAM:    HEART: Regular rate and rhythm, no murmurs gallops or rubs.  LUNGS: Clear to auscultation bilaterally.       ASSESSMENT/PLAN:     1.  History of HTN: Currently well controlled and that goal of less than 140/90.  He is on no medication.  Continue diet and exercise.  2.  Overweight: He was congratulated for losing weight.  He is currently exercising a lot training for a half marathon.  His diet is good except for he needs to increase fruits and vegetables.  Portion control at dinner reviewed.  We also did review his ideal body mass index.  3.  Vitamin D deficiency: He is currently not compliant taking vitamin D on a regular basis.  He will start doing so.  4.  Hyperlipidemia: His last profile was not at goal.  He was on vacation prior to doing in however.  Diet and exercise extensively reviewed.  He is  currently on no medicine, however he may require medication if it does not improve.  Order placed to check lipid profile/SMA-8.  5.  Health maintenance: Flu shot given    RETURN: 5 months cholesterol/vitamin D deficiency

## 2015-09-07 DIAGNOSIS — S92354D Nondisplaced fracture of fifth metatarsal bone, right foot, subsequent encounter for fracture with routine healing: Secondary | ICD-10-CM

## 2015-09-07 HISTORY — DX: Nondisplaced fracture of fifth metatarsal bone, right foot, subsequent encounter for fracture with routine healing: S92.354D

## 2015-09-13 ENCOUNTER — Other Ambulatory Visit: Payer: Self-pay | Admitting: Gastroenterology

## 2015-09-13 ENCOUNTER — Other Ambulatory Visit: Payer: Self-pay | Admitting: Primary Care

## 2015-09-13 ENCOUNTER — Encounter: Payer: Self-pay | Admitting: Orthopedic Surgery

## 2015-09-13 ENCOUNTER — Ambulatory Visit: Payer: Self-pay | Admitting: Orthopedic Surgery

## 2015-09-13 VITALS — Ht 69.0 in | Wt 200.0 lb

## 2015-09-13 DIAGNOSIS — S92309A Fracture of unspecified metatarsal bone(s), unspecified foot, initial encounter for closed fracture: Secondary | ICD-10-CM

## 2015-09-13 DIAGNOSIS — S93401A Sprain of unspecified ligament of right ankle, initial encounter: Secondary | ICD-10-CM

## 2015-09-13 DIAGNOSIS — S92354A Nondisplaced fracture of fifth metatarsal bone, right foot, initial encounter for closed fracture: Secondary | ICD-10-CM

## 2015-09-16 ENCOUNTER — Encounter: Payer: Self-pay | Admitting: Orthopedic Surgery

## 2015-09-16 NOTE — Progress Notes (Signed)
DIAGNOSES: Right ankle sprain with associated non displaced fracture at t he base of the 5th metatarsal.     FOLLOW UP VISIT: 2 weeks    XRAYS ON RETURN: none unless increased pain.     HISTORY OF PRESENT ILLNESS: [name] presents today for evaluation of injury to his right foot and ankle. He was playing paddleball yesterday and had jumped up landing on another players foot and twisting his right foot and ankle in the process. He had the immediate onset of pain and swelling and was evaluated at urgent care center this morning where he was diagnosed with a fracture of the foot, placed in an ace bandage and post op shoe and given crutches. He has had difficulty weight bearing in the post op shoe and has had to rely on his crutches.     Patient past medical, family social history and review of systems per patient questionnaire.  All aspects were reviewed and confirmed.  Details include pre-hypertension, L5-S1 fusion 1999.    PHYSICAL EXAMINATION: On physical examination today patient is in no acute distress.  The patient has a normal mood and affect. Right lower extremity examination reveals him to have moderate swelling of the foot and ankle. He has limited ROM at the ankle due to swelling. He has tenderness to palpation at the lateral ligament structures as well as the base of the 5th metatarsal. He has pain with attempted motion of the 4th, 5th metatarsals. His calf is soft and non tender and his achilles tendon is palpable and intact.     DIAGNOSTIC IMAGING: Right foot and ankle films were personally reviewed today and show non displaced fracture at the base of the 5th metatarsal. He also has some bone flecks appreciated at the medial aspect of the ankle which could represent avulsion injury.     ASSESSMENT/PLAN: [name] has sustained an ankle sprain with an associated fracture of the 5th metatarsal. We discussed that this can be managed closed and conservatively and have had him placed in a high tide walking boot to  immobilize both the ankle as well as the foot. He will follow up in two weeks and will likely transition him to a brace when he feels comfortable walking in normal shoe wear. He was prescribed the brace today to allow him to drive and will then put the boot back on to walk.  He may take OTC motrin and tylenol for pain and swelling. We discussed he will not be able to run a scheduled half marathon next month, but may allow him to start swimming and cycling in a couple of weeks.

## 2015-09-23 ENCOUNTER — Encounter: Payer: Self-pay | Admitting: Gastroenterology

## 2015-09-27 ENCOUNTER — Telehealth: Payer: Self-pay

## 2015-09-27 NOTE — Telephone Encounter (Signed)
Patient referred to our office but has declined an appointment.

## 2015-10-04 ENCOUNTER — Ambulatory Visit: Payer: Self-pay | Admitting: Orthopedic Surgery

## 2015-10-04 ENCOUNTER — Encounter: Payer: Self-pay | Admitting: Orthopedic Surgery

## 2015-10-04 VITALS — BP 134/87 | HR 59 | Ht 69.0 in | Wt 200.0 lb

## 2015-10-04 DIAGNOSIS — S92354A Nondisplaced fracture of fifth metatarsal bone, right foot, initial encounter for closed fracture: Secondary | ICD-10-CM

## 2015-10-07 ENCOUNTER — Encounter: Payer: Self-pay | Admitting: Orthopedic Surgery

## 2015-10-07 NOTE — Progress Notes (Signed)
This note has been dictated.

## 2015-10-16 ENCOUNTER — Other Ambulatory Visit: Payer: Self-pay | Admitting: Primary Care

## 2015-10-16 ENCOUNTER — Ambulatory Visit
Admission: RE | Admit: 2015-10-16 | Discharge: 2015-10-16 | Disposition: A | Discharge status: Home / Self Care | Payer: Self-pay | Source: Ambulatory Visit | Attending: Primary Care | Admitting: Primary Care

## 2015-10-16 DIAGNOSIS — I10 Essential (primary) hypertension: Secondary | ICD-10-CM

## 2015-10-16 DIAGNOSIS — E785 Hyperlipidemia, unspecified: Secondary | ICD-10-CM

## 2015-10-16 LAB — BASIC METABOLIC PANEL
Anion Gap: 15 (ref 7–16)
CO2: 28 mmol/L (ref 20–28)
Calcium: 9.5 mg/dL (ref 9.0–10.3)
Chloride: 102 mmol/L (ref 96–108)
Creatinine: 0.96 mg/dL (ref 0.67–1.17)
GFR,Black: 115 *
GFR,Caucasian: 100 *
Glucose: 90 mg/dL (ref 60–99)
Lab: 20 mg/dL (ref 6–20)
Potassium: 5.1 mmol/L (ref 3.3–5.1)
Sodium: 145 mmol/L (ref 133–145)

## 2015-10-16 LAB — LIPID PANEL
Chol/HDL Ratio: 4.5
Cholesterol: 266 mg/dL — AB
HDL: 59 mg/dL
LDL Calculated: 169 mg/dL
Non HDL Cholesterol: 207 mg/dL
Triglycerides: 192 mg/dL — AB

## 2015-10-16 MED ORDER — ATORVASTATIN CALCIUM 10 MG PO TABS *I*
10.0000 mg | ORAL_TABLET | Freq: Every day | ORAL | 5 refills | Status: DC
Start: 2015-10-16 — End: 2016-03-11

## 2015-10-30 ENCOUNTER — Encounter: Payer: Self-pay | Admitting: Orthopedic Surgery

## 2015-10-30 ENCOUNTER — Other Ambulatory Visit: Payer: Self-pay | Admitting: Orthopedic Surgery

## 2015-10-30 ENCOUNTER — Ambulatory Visit: Payer: Self-pay | Admitting: Orthopedic Surgery

## 2015-10-30 VITALS — BP 134/85 | Ht 69.0 in | Wt 202.0 lb

## 2015-10-30 DIAGNOSIS — S92354A Nondisplaced fracture of fifth metatarsal bone, right foot, initial encounter for closed fracture: Secondary | ICD-10-CM

## 2015-10-30 DIAGNOSIS — S93401A Sprain of unspecified ligament of right ankle, initial encounter: Secondary | ICD-10-CM

## 2015-10-30 DIAGNOSIS — M79671 Pain in right foot: Secondary | ICD-10-CM

## 2015-10-30 NOTE — Progress Notes (Signed)
Marland Kitchen:   Brigid ReMILLER, Manning EDWARD MR #:  [id number]   ACCOUNT #:  [id number] DOB:  03-06-1976   DICTATED BY:  Lamar SprinklesJohn Paul Charita Lindenberger, MD DATE OF VISIT:  10/30/2015     DIAGNOSIS:  Closed treatment right base of 5th metatarsal fracture.    FOLLOWUP VISIT:  P.r.n..    HISTORY OF PRESENT ILLNESS:  [name] returns for continued followup.  He states he has been doing well.  He has had minimal discomfort, and he only notes soreness at the end of the day.  He has not sustained any repeat injuries.    PHYSICAL EXAMINATION:  Patient is in distress.  Patient has normal mood and affect.  Evaluation of his right lower extremity reveals no tenderness over the fracture site.  He has good range of motion about the lateral column without instability.  He is, otherwise, neurovascularly intact to his right lower extremity.    DIAGNOSTIC IMAGING:  Three views right foot were reviewed.  Images reveal continued healing with near obliteration of the fracture line.    ASSESSMENT AND PLAN:  At this point, I think the patient is well healed.  I would be happy to see him back if he is having any further issues or problems, but I do think he is well healed.  We have discussed return to activities on a gradual and progressive basis.             ______________________________  Lamar SprinklesJohn Paul Leslye Puccini, MD    JPK/MODL  DD:  10/30/2015 09:27:02  DT:  10/30/2015 09:59:08  Job #:  [id number]    cc:

## 2015-11-01 NOTE — Progress Notes (Signed)
Marland Kitchen:   [name], [name] MR #:  [id number]   ACCOUNT #:  [id number] DOB:  [birth year]   DICTATED BY:  Lamar SprinklesJohn Paul Allexa Acoff, MD DATE OF VISIT:  10/04/2015     DIAGNOSIS:  Right ankle sprain with base of the 5th metatarsal fracture.    FOLLOWUP VISIT:  Three weeks.    X-RAYS ON RETURN:  Weightbearing 3 views right ankle.    HISTORY OF PRESENT ILLNESS:  The patient returns today for continued followup.  He states he has been doing well.  He notes that his pain has continued to improve.  He still continues to have some pain around the ankle joint, but very minimal around his foot.    PHYSICAL EXAMINATION:  GENERAL:  The patient is no distress.  The patient has normal mood and affect.  RIGHT LOWER EXTREMITY:  Reveals no evidence of redness or swelling.  He has some tenderness around the lateral aspect of the ankle joint.  He has good motion at the ankle.  He has good stability to inversion stress and anterior drawer testing.  He has some discomfort with range of motion about the lateral column.  He is otherwise neurovascularly intact to all 4 extremities.    DIAGNOSTIC IMAGING:  Three views of the left foot were reviewed.  Images reveal a healing fracture in satisfactory alignment.    ASSESSMENT/PLAN:  At this point, the patient seems to be doing well.  He will gradually transition out of his boot into normal shoes.  I will see him back in 3 weeks for repeat radiographs and followup.             ______________________________  Lamar SprinklesJohn Paul Allannah Kempen, MD    JPK/MODL  DD:  11/01/2015 11:10:05  DT:  11/01/2015 11:38:39  Job #:  [id number]    cc:

## 2015-11-03 ENCOUNTER — Encounter: Payer: Self-pay | Admitting: Orthopedic Surgery

## 2015-11-18 ENCOUNTER — Telehealth: Payer: Self-pay | Admitting: Primary Care

## 2015-11-18 NOTE — Telephone Encounter (Signed)
Patient calling feels that the Lipitor is causing him a Headache and nausea.  Last dose was Friday.  Still feeling ill today.  His anxiety is at a very heightened level.  Has decreased his Klonopin for the last several weeks.  Is feeling extremely anxious and irritable in the afternoon after 2pm.  "my head just isn't feeling right".  Has been in touch with the Dr. Claiborne Billingshat is helping him with this dosing adjustment.  Doesn't want to start on the Crestor just yet.

## 2015-12-11 ENCOUNTER — Encounter: Payer: Self-pay | Admitting: Primary Care

## 2015-12-11 ENCOUNTER — Ambulatory Visit: Payer: Self-pay | Admitting: Primary Care

## 2015-12-11 VITALS — BP 120/70 | HR 76 | Ht 69.0 in | Wt 209.0 lb

## 2015-12-11 DIAGNOSIS — F988 Other specified behavioral and emotional disorders with onset usually occurring in childhood and adolescence: Secondary | ICD-10-CM

## 2015-12-11 DIAGNOSIS — E785 Hyperlipidemia, unspecified: Secondary | ICD-10-CM

## 2015-12-11 DIAGNOSIS — E559 Vitamin D deficiency, unspecified: Secondary | ICD-10-CM

## 2015-12-11 NOTE — Progress Notes (Signed)
Chief Complaint   Patient presents with    Hyperlipidemia    ADD eval    Abnormal Lab     Vit D def      Current Outpatient Prescriptions   Medication Sig Dispense Refill    cholecalciferol (VITAMIN D) 2000 UNITS tablet Take 2,000 Units by mouth 2 times daily      clonazePAM (KLONOPIN) 0.5 MG tablet Take 0.5 mg by mouth 2 times daily   MDD 1 mg      Multiple Vitamin (MULTIVITAMIN) TABS Take 1 tablet by mouth daily      atorvastatin (LIPITOR) 10 MG tablet Take 1 tablet (10 mg total) by mouth daily 30 tablet 5     No current facility-administered medications for this visit.      Medications reviewed and no changes made     He started Lipitor 10mg  a day 10/16/15 and after 2 days, "did not feel good"-felt depressed/fatigued/light headed.  He stopped taking it and felt better after ~2 weeks.  He never re-tried it.   He wants to go back to a near vegan diet and start exercising. He wants to use elliptical/bike.     His last Vit D was 24 04/09/15.  He has been taking 4000-5000 units Vit D a day since    His ADD is acting up in the last 3-4 months.  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 He states he has a hand written schedule he uses daily and if it gets out of order, its hard to complete tasks/focus.  He has 2-3 day deadlines at work.  He states he never took ADD meds. He was evaluated ~age 15 by a Psychologist and tested positive for ADD. He drinks 16 oz coffee/d.    EXAM:    AFFECT: Pleasant and cheerful in no acute distress    Assessment/Plan:    1.  Hyperlipidemia: Patient plans on going back to a nearly all vegan diet, he plans on still eating fish.  He will try to start exercising as well.  He prefers to hold off on going on medication again especially after he had the above symptoms 2 days after starting Lipitor.  He is not willing to retry Lipitor to see if it truly was the Lipitor causing the symptoms.  2.  Vitamin D deficiency: Continue taking vitamin D.  We'll recheck vitamin D at  his next visit  3.  ADD: Patient wants to go on medication.  He will call his insurance to see which medications are on formulary and then let me know.  We did discuss side effects as well as interactions of ADD medications.    RETURN: 3 months hyperlipidemia/vitamin D deficiency/ADD

## 2015-12-13 ENCOUNTER — Telehealth: Payer: Self-pay | Admitting: Primary Care

## 2015-12-13 ENCOUNTER — Other Ambulatory Visit: Payer: Self-pay | Admitting: Primary Care

## 2015-12-13 MED ORDER — AMPHETAMINE-DEXTROAMPHETAMINE 5 MG PO CP24 *I*
5.0000 mg | ORAL_CAPSULE | Freq: Every day | ORAL | 0 refills | Status: DC | PRN
Start: 2015-12-13 — End: 2016-03-11

## 2015-12-13 NOTE — Telephone Encounter (Signed)
Patient was on phone with insurance for 2 hours trying to get through without success. Patient would like to know if PCP can send in ADHD Medication to local pharmacy on file. And if it denied he will then ask pharmacist what formulary medication will insurance cover. Per patient, medication for ADHD was recently discussed with PCP.     ISTOP has been verified.

## 2015-12-13 NOTE — Telephone Encounter (Signed)
Left message on cell phone that a prescription for Adderall XR 5 mg daily when necessary was called in to his pharmacy.  Side effects reviewed.  He was advised to call me in a couple of weeks with an update.

## 2015-12-24 ENCOUNTER — Encounter: Payer: Self-pay | Admitting: Optometry

## 2015-12-24 ENCOUNTER — Ambulatory Visit: Payer: Self-pay | Admitting: Optometry

## 2015-12-24 DIAGNOSIS — H5213 Myopia, bilateral: Secondary | ICD-10-CM

## 2015-12-24 DIAGNOSIS — H21233 Degeneration of iris (pigmentary), bilateral: Secondary | ICD-10-CM

## 2015-12-24 DIAGNOSIS — Z973 Presence of spectacles and contact lenses: Secondary | ICD-10-CM

## 2015-12-24 DIAGNOSIS — H52203 Unspecified astigmatism, bilateral: Secondary | ICD-10-CM

## 2015-12-25 NOTE — Progress Notes (Signed)
Outpatient Visit      Patient name: [name]  DOB: May 01, 1976       Age: 40 y.o.  MR#: [id number]    Encounter Date: 12/24/2015    Subjective:      Chief Complaint   Patient presents with    Annual Exam     HPI     Annual Exam/SCL wearer, LEE 1-2 yrs Hx: Pigment dispersion syndrome OU,   Myopia w/astigmatism. Pt states he needs new CL's, ran out recently. With   either glasses or CL's, VA is stable OU near and far. Denies eye pain or   irrita, tion. No DM.       Last edited by Loyce Dys, OD on 12/24/2015  4:39 PM.     has a current medication list which includes the following prescription(s): amphetamine-dextroamphetamine, cholecalciferol, atorvastatin, clonazepam, and multivitamin.     has No Known Allergies (drug, envir, food or latex).      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 (History)         Objective:     Base Eye Exam     Visual Acuity (Snellen - Linear)      Right Left   Dist cc 20/20 20/20   Near cc J1+ J1+slow       Correction:  Glasses      Tonometry (Applanation, 4:59 PM)      Right Left   Pressure 14 15            Pupils      Pupils APD   Right PERRLA None   Left PERRLA None         Visual Fields      Left Right   Result Full Full         Extraocular Movement      Right Left   Result Full Full         Neuro/Psych     Oriented x3:  Yes    Mood/Affect:  Normal      Dilation     Both eyes:  2.5% Phenylephrine, 1.0% Tropicamide @ 4:59 PM            Slit Lamp and Fundus  Exam     External Exam      Right Left    External Normal ocular adnexae, lacrimal gland & drainage, orbits Normal ocular adnexae, lacrimal gland & drainage, orbits      Slit Lamp Exam      Right Left    Lids/Lashes Normal structure & position Normal structure & position    Conjunctiva/Sclera trace papillae trace papillae    Cornea endo pigment, k-spindle endo pigment, k-spindle    Anterior Chamber Clear & deep Clear & deep    Iris Transillumination defects Transillumination defects-superior 9-3 o'clock    Lens Normal cortex, nucleus, anterior/posterior capsule, clarity Normal cortex, nucleus, anterior/posterior capsule, clarity      Fundus Exam      Right Left    Vitreous Clear Clear    Disc Normal size, appearance, nerve fiber layer Normal size, appearance, nerve fiber layer    C/D Ratio 0.4 0.6    Macula Normal Normal    Vessels Normal Normal    Periphery Normal Normal            Refraction     Wearing Rx      Sphere Cylinder Axis   Right -2.50 -1.50 095   Left -2.50 -2.00 091       Age:  1-5yrs    Type:  SVL      Manifest Refraction      Sphere Cylinder Axis Dist   Right -2.50 -1.50 090 20/20-2   Left -2.50 -2.00 090 20/20-2         Final Rx      Sphere Cylinder Axis   Right -2.50 -1.50 090   Left -2.50 -2.00 090       Expiration Date:  12/24/2017            Contact Lens Exam     Current Contact Lens Rx      Brand Base Curve Diameter Sphere Cylinder Axis Dist VA Centration Movement   Right [employer] Toric 8.70 14.5 -2.50 -1.25 100      Left [employer] Tor[employer] 8.70 14.5 -2.50 -1.25 080          Rotation   Right    Left          Current Contact Lens  Rx #2      Brand Base Curve Diameter Sphere Cylinder Axis Dist VA Centration Movement   Right [employer]: [employer] Toric   -2.50 -1.25 100 20/25 Well centered Appropriate   Left [employer]: [employer] Toric   -2.50 -1.25 080 20/30 Well centered Appropriate       Rotation   Right 0 degrees   Left 0 degrees         Contact History     Solutions Used:  MP      Final Contact Lens Rx      Brand Sphere Cylinder Axis   Right [employer]: [employer] Toric -2.50 -1.25 100   Left [employer]: [employer] Toric -2.50 -1.25 080       Expiration Date:  12/24/2016    Replacement:  Monthly              Final Rx      Sphere Cylinder Axis   Right -2.50 -1.50 090   Left -2.50 -2.00 090       Expiration Date:  12/24/2017        Final Contact Lens Rx      Brand Sphere Cylinder Axis   Right [employer]: [employer] Toric -2.50 -  1.25 100   Left [employer]: [employer] Toric -2.50 -1.25 080       Expiration Date:  12/24/2016    Replacement:  Monthly              No annotated images are attached to the encounter.      Assessment/Plan:      1. Myopia with astigmatism, bilateral     2. Pigmentary dispersion syndrome, bilateral     3. Wears contact lenses          PLAN:  1. A new prescription for glasses was given.     2. No evidence of PD glaucoma.   IOP WNL OU  Continue to monitor    3. Gave trials of biofinity and finalized Rx   If pt unhappy with trials-will need to return for [facility]

## 2015-12-31 ENCOUNTER — Ambulatory Visit: Payer: Self-pay | Admitting: Optometry

## 2016-01-02 ENCOUNTER — Ambulatory Visit: Payer: Self-pay | Admitting: Optometry

## 2016-02-14 ENCOUNTER — Ambulatory Visit: Payer: Self-pay | Admitting: Primary Care

## 2016-03-11 ENCOUNTER — Ambulatory Visit: Payer: Self-pay | Admitting: Primary Care

## 2016-03-11 ENCOUNTER — Encounter: Payer: Self-pay | Admitting: Primary Care

## 2016-03-11 VITALS — BP 112/68 | HR 71 | Resp 16 | Wt 208.4 lb

## 2016-03-11 DIAGNOSIS — E785 Hyperlipidemia, unspecified: Secondary | ICD-10-CM

## 2016-03-11 DIAGNOSIS — E559 Vitamin D deficiency, unspecified: Secondary | ICD-10-CM

## 2016-03-11 DIAGNOSIS — F988 Other specified behavioral and emotional disorders with onset usually occurring in childhood and adolescence: Secondary | ICD-10-CM

## 2016-03-11 MED ORDER — AMPHETAMINE-DEXTROAMPHETAMINE 10 MG PO CP24 *I*
10.0000 mg | ORAL_CAPSULE | Freq: Every morning | ORAL | 0 refills | Status: DC
Start: 2016-03-11 — End: 2016-04-20

## 2016-03-11 NOTE — Progress Notes (Signed)
Chief Complaint   Patient presents with    Hyperlipidemia    ADD    Abnormal Lab     Low Vit D     Current Outpatient Prescriptions   Medication Sig Dispense Refill    amphetamine-dextroamphetamine (ADDERALL XR) 5 MG 24 hr capsule Take 1 capsule (5 mg total) by mouth daily as needed   Max daily dose: 5 mg 30 capsule 0    cholecalciferol (VITAMIN D) 2000 UNITS tablet Take 2,000 Units by mouth 2 times daily      clonazePAM (KLONOPIN) 0.5 MG tablet Take 0.5 mg by mouth 2 times daily   MDD 1 mg      Multiple Vitamin (MULTIVITAMIN) TABS Take 1 tablet by mouth daily       No current facility-administered medications for this visit.      Medications reviewed and changes made     CHOLESTEROL MANAGEMENT:       Visit Vitals    BP 112/68 (BP Location: Left arm, Patient Position: Sitting, Cuff Size: large adult)    Pulse 71    Resp 16    Wt 94.5 kg (208 lb 6.4 oz)    SpO2 98%    BMI 30.78 kg/m2      He has been eating primarily vegetarian since last visit-occasionally  Fish/chicken    DIET:       --Eggs:   [x] Yes 2/week  [] No          --Fast Food:  [] Yes  [x] No          --Junk Food:  [] Yes  [x] No           --Milk:   none        --Butter:   None.  Cooks with olive oil       --Fiber:   [x] Yes a lot-vegetarian, a lot of brown carbs  [] No        EXERCISE:   4-5x week the past 2 weeks    MEDICATIONS:   none    He has had a low Vit D and now is taking his Vit D pill.  No milk.  Occasional fish.     He started Adderall XR 5mg  a day 12/2015.  It helps for ~4 hours then wears off.  No dry mouth.  No palpitations.     EXAM:    AFFECT:  Pleasant and cheerful in NAD    ASSESSMENT/PLAN:    1.  Hyperlipidemia: Patient has improved his diet and will start exercising more.  He prefers to not go on medication.  He did take Lipitor for 2 days but felt depressed/fatigue/lightheaded so stopped it.  Unclear if this was truly an adverse reaction or not.  Order placed to check lipid profile  2.  ADD: Patient requests increase in Adderall  XR 5 mg daily when necessary to 10 mg daily when necessary.  This was done.  He will call an update  3.  Vitamin D deficiency: He has been compliant taking his vitamin D pill.  Order placed to recheck vitamin D level.  We did discuss a low vitamin D level sometimes can be associated with mood disorders.    RETURN: 6 months hyperlipidemia/ADD

## 2016-04-20 ENCOUNTER — Other Ambulatory Visit: Payer: Self-pay | Admitting: Primary Care

## 2016-04-20 MED ORDER — AMPHETAMINE-DEXTROAMPHETAMINE 10 MG PO CP24 *I*
10.0000 mg | ORAL_CAPSULE | Freq: Every morning | ORAL | 0 refills | Status: DC
Start: 2016-04-20 — End: 2016-05-26

## 2016-04-23 ENCOUNTER — Other Ambulatory Visit
Admission: RE | Admit: 2016-04-23 | Discharge: 2016-04-23 | Disposition: A | Discharge status: Home / Self Care | Payer: Self-pay | Source: Ambulatory Visit | Attending: Primary Care | Admitting: Primary Care

## 2016-04-23 DIAGNOSIS — E559 Vitamin D deficiency, unspecified: Secondary | ICD-10-CM

## 2016-04-23 DIAGNOSIS — E785 Hyperlipidemia, unspecified: Secondary | ICD-10-CM

## 2016-04-23 LAB — LIPID PANEL
Chol/HDL Ratio: 5.2
Cholesterol: 277 mg/dL — AB
HDL: 53 mg/dL
LDL Calculated: 169 mg/dL
Non HDL Cholesterol: 224 mg/dL
Triglycerides: 273 mg/dL — AB

## 2016-04-23 LAB — AST: AST: 23 U/L (ref 0–50)

## 2016-04-23 LAB — GGT: GGT: 51 U/L (ref 8–61)

## 2016-04-23 LAB — ALT: ALT: 22 U/L (ref 0–50)

## 2016-04-27 ENCOUNTER — Other Ambulatory Visit: Payer: Self-pay | Admitting: Primary Care

## 2016-04-27 DIAGNOSIS — E785 Hyperlipidemia, unspecified: Secondary | ICD-10-CM

## 2016-04-27 LAB — VITAMIN D
25-OH VIT D2: <4 ng/mL
25-OH VIT D3: 38 ng/mL
25-OH Vit Total: 38 ng/mL (ref 30–60)

## 2016-04-27 MED ORDER — ROSUVASTATIN CALCIUM 10 MG PO TABS *I*
10.0000 mg | ORAL_TABLET | Freq: Every day | ORAL | 5 refills | Status: DC
Start: 2016-04-27 — End: 2016-11-09

## 2016-04-27 NOTE — Telephone Encounter (Signed)
Received your message on lab results.  Is ready to start Crestor.  Please advise on dosing.  Thanks.

## 2016-04-27 NOTE — Telephone Encounter (Signed)
Left a message for the patient that the medication has been sent to his pharmacy.  I will be sending a req to his home for lab work.

## 2016-04-27 NOTE — Telephone Encounter (Signed)
OK to start Crestor 10mg  a day. Check Lipids/AST/SLT 6 weeks.

## 2016-05-26 ENCOUNTER — Other Ambulatory Visit: Payer: Self-pay | Admitting: Primary Care

## 2016-05-26 MED ORDER — AMPHETAMINE-DEXTROAMPHETAMINE 10 MG PO CP24 *I*
10.0000 mg | ORAL_CAPSULE | Freq: Every morning | ORAL | 0 refills | Status: DC
Start: 2016-05-26 — End: 2016-07-03

## 2016-06-24 ENCOUNTER — Other Ambulatory Visit
Admission: RE | Admit: 2016-06-24 | Discharge: 2016-06-24 | Disposition: A | Discharge status: Home / Self Care | Payer: Self-pay | Source: Ambulatory Visit | Attending: Primary Care | Admitting: Primary Care

## 2016-06-24 DIAGNOSIS — E785 Hyperlipidemia, unspecified: Secondary | ICD-10-CM

## 2016-06-24 LAB — LIPID PANEL
Chol/HDL Ratio: 3.1
Cholesterol: 166 mg/dL
HDL: 54 mg/dL
LDL Calculated: 69 mg/dL
Non HDL Cholesterol: 112 mg/dL
Triglycerides: 215 mg/dL — AB

## 2016-06-24 LAB — ALT: ALT: 35 U/L (ref 0–50)

## 2016-06-24 LAB — AST: AST: 25 U/L (ref 0–50)

## 2016-07-03 ENCOUNTER — Ambulatory Visit
Admission: AD | Admit: 2016-07-03 | Discharge: 2016-07-03 | Disposition: A | Discharge status: Home / Self Care | Payer: Self-pay | Attending: Emergency Medicine | Admitting: Emergency Medicine

## 2016-07-03 ENCOUNTER — Other Ambulatory Visit: Payer: Self-pay | Admitting: Primary Care

## 2016-07-03 DIAGNOSIS — H60311 Diffuse otitis externa, right ear: Secondary | ICD-10-CM

## 2016-07-03 MED ORDER — CIPROFLOXACIN HCL 0.3 % OP SOLN *I*
2.0000 [drp] | OPHTHALMIC | 0 refills | Status: DC
Start: 2016-07-03 — End: 2016-09-11

## 2016-07-03 MED ORDER — AMPHETAMINE-DEXTROAMPHETAMINE 10 MG PO CP24 *I*
10.0000 mg | ORAL_CAPSULE | Freq: Every morning | ORAL | 0 refills | Status: DC
Start: 2016-07-03 — End: 2016-08-03

## 2016-07-03 NOTE — ED Triage Notes (Signed)
Patient states right ear pain, with some decreased hearing to same ear. States no injury, swimming about a week ago.       Triage Note   Gavin Pound, RN

## 2016-07-03 NOTE — UC Provider Note (Addendum)
History     Chief Complaint   Patient presents with    Otalgia     Patient states right ear pain, with some decreased hearing to same ear. States no injury, swimming about a week ago.     Patient is a 40 y.o. male presenting with ear pain.   History provided by:  Patient  Language interpreter used: No    Is this ED visit related to civilian activity for income:  Not work related  Otalgia   Location:  Right  Quality:  Dull  Onset quality:  Gradual  Duration:  1 week  Timing:  Constant  Progression:  Worsening  Chronicity:  New  Context: water in ear    Relieved by:  Nothing  Worsened by:  Nothing  Ineffective treatments:  None tried  Associated symptoms: hearing loss    Associated symptoms: no congestion, no ear discharge, no fever, no rhinorrhea, no sore throat and no tinnitus    Associated symptoms comment:  None      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    Vertigo     Steroid injection into right ear 12/17/2011            Past Surgical History:   Procedure Laterality Date    LEFT MENISCECTOMY  01/2012    Medial & Lateral    LUMBAR FUSION      Lumbar Vertebral Fusion Conversion Data     SPINAL FUSION  1999    L5-S1    TYMPANOSTOMY TUBE PLACEMENT         Family History   Problem Relation Age of Onset    Conversion Other      [id number] Health Status^^Active^Father(A)HTN.Chol.Allergies Mother(A)Migraines 1 Brother(A)ADD 2 Sisters(A) 1 Daughter(A) PatGF(A)CAD.Glaucoma  DM2.Chol PatGM(D)49.MI MatGF(D)60s.Bone Ca MatGM(A)    Hearing loss Maternal Grandmother     High cholesterol Father     Hypertension Father          Social History    reports that he  quit smoking about 10 years ago. His smoking use included Cigarettes. He has a 6.00 pack-year smoking history. He has never used smokeless tobacco. He reports that he drinks alcohol. He reports that he currently engages in sexual activity and has had male partners. He reports that he does not use illicit drugs.    Living Situation     Questions Responses    Patient lives with Family    Homeless No    Caregiver for other family member No    External Services     Employment Employed    Comment: Marine scientist     Domestic Violence Risk           Review of Systems   Review of Systems   Constitutional: Negative for activity change, appetite change, chills, diaphoresis, fatigue, fever and unexpected weight change.   HENT: Positive for ear pain and hearing loss. Negative for congestion, dental problem, drooling, ear discharge, mouth sores, nosebleeds, postnasal drip, rhinorrhea, sinus pressure, sneezing, sore throat and tinnitus.    Respiratory: Negative.    Cardiovascular: Negative.  Gastrointestinal: Negative.    Endocrine: Negative.    Musculoskeletal: Negative.    Skin: Negative.    Allergic/Immunologic: Negative.    Neurological: Negative.    Hematological: Negative.    Psychiatric/Behavioral: Negative.        Physical Exam     ED Triage Vitals   BP Heart Rate Heart Rate (via Pulse Ox) Resp Temp Temp src SpO2 O2 Device O2 Flow Rate   07/03/16 2101 07/03/16 2101 07/03/16 2101 07/03/16 2101 07/03/16 2101 07/03/16 2101 07/03/16 2101 07/03/16 2101 --   125/83 62 62 22 36.8 C (98.2 F) TEMPORAL 98 % None (Room air)       Weight           07/03/16 2101           90.7 kg (200 lb)                    Physical Exam   Constitutional: He appears well-developed and well-nourished. No distress.   HENT:   Head: Normocephalic and atraumatic.   Nose: Nose normal.   Mouth/Throat: Oropharynx is clear and moist.   Erythema and tenderness to the right ear canal . TM is normal   Neck: Neck supple.   Cardiovascular: Exam reveals no  gallop and no friction rub.    No murmur heard.  Pulmonary/Chest: No respiratory distress. He has no wheezes. He has no rales. He exhibits no tenderness.   Abdominal: He exhibits no distension and no mass. There is no tenderness. There is no rebound and no guarding. No hernia.   Musculoskeletal: He exhibits no edema, tenderness or deformity.   Lymphadenopathy:     He has no cervical adenopathy.   Neurological: He displays normal reflexes. No cranial nerve deficit. He exhibits normal muscle tone. Coordination normal.   Skin: No rash noted. He is not diaphoretic. No pallor.        Medical Decision Making        Initial Evaluation:  ED First Provider Contact     Date/Time Event User Comments    07/03/16 2121 ED Provider First Contact Ignacia Bayley Initial Face to Face Provider Contact          Patient was seen on: 07/03/2016        Assessment:  40 y.o.male [facility] with right otitis externa                    Plan: cipro otic solution Q 4 h X 7 days   Avoid water from getting to the ears   F/U with PCP as needed    Final Diagnosis  Final diagnoses:   [H60.311] Acute diffuse otitis externa of right ear (Primary)           Richarda Osmond, MD       Addison Naegeli, MD  07/03/16 2126       Addison Naegeli, MD  07/04/16 [phone]

## 2016-07-04 ENCOUNTER — Encounter: Payer: Self-pay | Admitting: Emergency Medicine

## 2016-07-10 ENCOUNTER — Encounter: Payer: Self-pay | Admitting: Primary Care

## 2016-07-10 ENCOUNTER — Ambulatory Visit: Payer: Self-pay | Admitting: Primary Care

## 2016-07-10 VITALS — BP 110/68 | HR 76 | Ht 69.0 in | Wt 202.0 lb

## 2016-07-10 DIAGNOSIS — R5383 Other fatigue: Secondary | ICD-10-CM

## 2016-07-10 DIAGNOSIS — F419 Anxiety disorder, unspecified: Secondary | ICD-10-CM

## 2016-07-10 DIAGNOSIS — Z566 Other physical and mental strain related to work: Secondary | ICD-10-CM

## 2016-07-10 DIAGNOSIS — F32A Depression, unspecified: Secondary | ICD-10-CM

## 2016-07-10 MED ORDER — ESCITALOPRAM OXALATE 10 MG PO TABS *I*
10.0000 mg | ORAL_TABLET | Freq: Every day | ORAL | 5 refills | Status: DC
Start: 2016-07-10 — End: 2017-01-25

## 2016-07-10 NOTE — Progress Notes (Signed)
Chief Complaint   Patient presents with    Anxiety    Depression     Vitals:    07/10/16 1349   BP: 110/68   Pulse: 76   Weight: 91.6 kg (202 lb)   Height: 1.753 m (5\' 9" )     Current Outpatient Prescriptions   Medication Sig Dispense Refill    amphetamine-dextroamphetamine (ADDERALL XR) 10 MG 24 hr capsule Take 1 capsule (10 mg total) by mouth every morning   Max daily dose: 10 mg May sprinkle contents of capsule on food, do not chew capsule. 30 capsule 0    ciprofloxacin (CILOXAN) 0.3 % ophthalmic solution Place 2 drops in ear(s) every 4 hours   To the right ear 5 mL 0    rosuvastatin (CRESTOR) 10 MG tablet Take 1 tablet (10 mg total) by mouth daily 30 tablet 5    cholecalciferol (VITAMIN D) 2000 UNITS tablet Take 2,000 Units by mouth 2 times daily      clonazePAM (KLONOPIN) 0.5 MG tablet Take 0.5 mg by mouth 2 times daily   MDD 1 mg      Multiple Vitamin (MULTIVITAMIN) TABS Take 1 tablet by mouth daily       No current facility-administered medications for this visit.      He has been seeing a counselor for his mood weekly the past 2 months ([facility]). His daughter also sees her for anxiety.  He has been chronically taking Klonopin 0.5mg  BID for anxiety/sleep/migraine prevention.  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 He is on call ~2 nights a week.  Only has 4 days off a month. He has a 53 and 28 year old daughters and feels the time he spends with them is not as enjoyable as he would like-mind is on other things like work/tasks around the house. He is not suicidal.  He bikes to work for exercise. He has fatigue at the end of the day. He did take Lexapro 10mg  08/2011 and then increased to 20mg  a day 09/2011.  Had some fatigue at the higher dose.  He had some vertigo which in retrospect was [place].     EXAM:    AFFECT:  Pleasant with mild flat affect in NAD    Assessment/Plan:    1.  Anxiety/depression/fatigue/work stress: Patient will continue seeing his counselor  as above.  A considerable amount of the visit was spent discussing ways to decrease work stress.  Coping strategies reviewed.  Relaxation techniques reviewed.  Sleep hygiene reviewed  He was congratulated for exercising.  He is requesting medication for his mood and wants to retry Lexapro 10 mg daily.  He could not tolerate 20 mg daily as above in the past.  He is currently taking twice a day Klonopin and is interested in hopefully weaning down the dose.  Call if no better/worse.  Patient prefers to communicate via phone message on how he was doing the next 2 weeks.      Greater than  50%   of this  40  minute visit was spent on education, counselling, or coordinating care of the patients anxiety/depression/fatigue/work stress    RETURN: 09/2016 cholesterol/anxiety/depression/ADD

## 2016-07-21 ENCOUNTER — Other Ambulatory Visit: Payer: Self-pay | Admitting: Primary Care

## 2016-08-03 ENCOUNTER — Other Ambulatory Visit: Payer: Self-pay | Admitting: Primary Care

## 2016-08-03 MED ORDER — AMPHETAMINE-DEXTROAMPHETAMINE 10 MG PO CP24 *I*
10.0000 mg | ORAL_CAPSULE | Freq: Every morning | ORAL | 0 refills | Status: DC
Start: 2016-08-03 — End: 2016-09-23

## 2016-09-10 ENCOUNTER — Encounter: Payer: Self-pay | Admitting: Gastroenterology

## 2016-09-11 ENCOUNTER — Ambulatory Visit: Payer: Self-pay | Admitting: Primary Care

## 2016-09-11 ENCOUNTER — Encounter: Payer: Self-pay | Admitting: Primary Care

## 2016-09-11 VITALS — BP 120/72 | HR 68 | Ht 69.0 in | Wt 197.0 lb

## 2016-09-11 DIAGNOSIS — F419 Anxiety disorder, unspecified: Secondary | ICD-10-CM

## 2016-09-11 DIAGNOSIS — Z23 Encounter for immunization: Secondary | ICD-10-CM

## 2016-09-11 DIAGNOSIS — E785 Hyperlipidemia, unspecified: Secondary | ICD-10-CM | POA: Insufficient documentation

## 2016-09-11 DIAGNOSIS — F988 Other specified behavioral and emotional disorders with onset usually occurring in childhood and adolescence: Secondary | ICD-10-CM

## 2016-09-11 DIAGNOSIS — F32A Depression, unspecified: Secondary | ICD-10-CM | POA: Insufficient documentation

## 2016-09-11 NOTE — Progress Notes (Signed)
Chief Complaint   Patient presents with    ADHD    Hyperlipidemia    Anxiety    Depression     Current Outpatient Prescriptions   Medication Sig Dispense Refill    amphetamine-dextroamphetamine (ADDERALL XR) 10 MG 24 hr capsule Take 1 capsule (10 mg total) by mouth every morning   Max daily dose: 10 mg May sprinkle contents of capsule on food, do not chew capsule. 30 capsule 0    escitalopram (LEXAPRO) 10 MG tablet Take 1 tablet (10 mg total) by mouth daily 30 tablet 5    rosuvastatin (CRESTOR) 10 MG tablet Take 1 tablet (10 mg total) by mouth daily 30 tablet 5    cholecalciferol (VITAMIN D) 2000 UNITS tablet Take 2,000 Units by mouth 2 times daily      clonazePAM (KLONOPIN) 0.5 MG tablet Take 0.5 mg by mouth 2 times daily   MDD 1 mg      Multiple Vitamin (MULTIVITAMIN) TABS Take 1 tablet by mouth daily       No current facility-administered medications for this visit.      Medications reviewed and no changes made     CHOLESTEROL MANAGEMENT:       BP 120/72   Pulse 68   Ht 1.753 m (5\' 9" )   Wt 89.4 kg (197 lb)   BMI 29.09 kg/m2     DIET:       --Eggs:   [x] Yes 2/week  [] No          --Fast Food:  [] Yes  [x] No          --Junk Food:  [] Yes  [x] No           --Milk:   none        --Butter:   none       --Fiber:   [x] Yes 4-5 servings fruit/veges a day. Eats wheat bread/brown rice.  Eats wheat and white pasta.  [] No        MEDICATIONS:     He started Lexapro 10mg  a day 2 months ago for increased work stress/depression>anxiety.  He feels less depressed/irritible. No side effects (in the past, he could not tolerate the 20mg  dose).  He still sees his counselor [facility] weekly who helps him with not letting his emotions build up and then be released as anger.  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 He bikes/exercises a lot for stress relief.  He is not suicidal.  He uses Klonopin BID-it helps him sleep as well.    Uses Adderall every day except ~4 days a month.  He can focus more in the  morning      EXAM:    AFFECT:  Pleasant and cheerful in NAD    ASSESSMENT/PLAN:    1.  Hyperlipidemia: Patient's last profile was at goal except for his triglycerides were greater than 150.  He does a good job with diet and exercise.  Continue Crestor.  Check lipid profile/LFTs  2.  Depression> anxiety:  Secondary to work stress/communication issues with his wife.  He is doing much better on low-dose Lexapro and with weekly counseling.  Supportive counseling today was given.  He does use Klonopin twice a day.  Call if symptoms worsen  3.  ADD: Doing well with low-dose Adderall.  4.  Health maintenance: Flu shot given    RETURN: 6 months HTN/cholesterol/anxiety/depression/ADD

## 2016-09-23 ENCOUNTER — Other Ambulatory Visit: Payer: Self-pay | Admitting: Primary Care

## 2016-09-23 MED ORDER — AMPHETAMINE-DEXTROAMPHETAMINE 10 MG PO CP24 *I*
10.0000 mg | ORAL_CAPSULE | Freq: Every morning | ORAL | 0 refills | Status: DC
Start: 2016-09-23 — End: 2016-10-27

## 2016-10-27 ENCOUNTER — Other Ambulatory Visit: Payer: Self-pay | Admitting: Primary Care

## 2016-10-27 MED ORDER — AMPHETAMINE-DEXTROAMPHETAMINE 10 MG PO CP24 *I*
10.0000 mg | ORAL_CAPSULE | Freq: Every morning | ORAL | 0 refills | Status: DC
Start: 2016-10-27 — End: 2016-12-08

## 2016-11-09 ENCOUNTER — Other Ambulatory Visit: Payer: Self-pay | Admitting: Primary Care

## 2016-11-09 MED ORDER — ROSUVASTATIN CALCIUM 10 MG PO TABS *I*
10.0000 mg | ORAL_TABLET | Freq: Every day | ORAL | 5 refills | Status: DC
Start: 2016-11-09 — End: 2017-05-18

## 2016-11-11 ENCOUNTER — Other Ambulatory Visit
Admission: RE | Admit: 2016-11-11 | Discharge: 2016-11-11 | Disposition: A | Discharge status: Home / Self Care | Payer: Self-pay | Source: Ambulatory Visit | Attending: Primary Care | Admitting: Primary Care

## 2016-11-11 DIAGNOSIS — E785 Hyperlipidemia, unspecified: Secondary | ICD-10-CM

## 2016-11-11 LAB — AST: AST: 25 U/L (ref 0–50)

## 2016-11-11 LAB — LIPID PANEL
Chol/HDL Ratio: 2.8
Cholesterol: 169 mg/dL
HDL: 61 mg/dL
LDL Calculated: 86 mg/dL
Non HDL Cholesterol: 108 mg/dL
Triglycerides: 110 mg/dL

## 2016-11-11 LAB — ALT: ALT: 30 U/L (ref 0–50)

## 2016-11-11 LAB — GGT: GGT: 46 U/L (ref 8–61)

## 2016-12-08 ENCOUNTER — Other Ambulatory Visit: Payer: Self-pay | Admitting: Primary Care

## 2016-12-08 MED ORDER — AMPHETAMINE-DEXTROAMPHETAMINE 10 MG PO CP24 *I*
10.0000 mg | ORAL_CAPSULE | Freq: Every morning | ORAL | 0 refills | Status: DC
Start: 2016-12-08 — End: 2017-01-07

## 2016-12-28 ENCOUNTER — Other Ambulatory Visit: Payer: Self-pay | Admitting: Primary Care

## 2016-12-28 MED ORDER — CYCLOBENZAPRINE HCL 10 MG PO TABS *I*
ORAL_TABLET | ORAL | 0 refills | Status: AC
Start: 2016-12-28 — End: 2017-01-03

## 2016-12-28 NOTE — Telephone Encounter (Signed)
Patient calling with c/o back pain, pain feels muscular In nature.  Has a history of back issues.  Previous fusion.  This is causing him some frequent muscle spasms.  Has had flexeril in the past.  Flexeril 10 mg 1/2 tablet bid prn.

## 2016-12-30 ENCOUNTER — Encounter: Payer: Self-pay | Admitting: Orthopedic Surgery

## 2016-12-30 ENCOUNTER — Ambulatory Visit: Payer: Self-pay | Admitting: Orthopedic Surgery

## 2016-12-30 VITALS — BP 136/76 | HR 77 | Ht 69.0 in | Wt 200.0 lb

## 2016-12-30 DIAGNOSIS — Z981 Arthrodesis status: Secondary | ICD-10-CM

## 2016-12-30 DIAGNOSIS — M5136 Other intervertebral disc degeneration, lumbar region: Secondary | ICD-10-CM

## 2016-12-30 DIAGNOSIS — M545 Low back pain, unspecified: Secondary | ICD-10-CM

## 2016-12-30 MED ORDER — METHYLPREDNISOLONE 4 MG PO TBPK *A*: ORAL_TABLET | ORAL | 0 refills | Status: DC

## 2016-12-30 NOTE — Progress Notes (Signed)
Subjective:  [name] returns today for an unplanned follow-up.  He is now 41 years of age and is 20 years out from an instrumented fusion L5-S1 performed as an adolescent for spondylolisthesis.  He was last seen by this office in 2015.    Patient report    Current Chief Complaint:  Patient reports he was doing extremely well with very little discomfort up until 5 days ago.  At that time he was playing paddle tennis.  There was no specific injury but he began to experience some discomfort in his low back following the game.  The following day the pain intensified and he was unable to move.  He has spent the last 3 days lying on the floor at his home.  He has been unable to work.  He has been using Aleve and a small dose of Flexeril that was prescribed through his PCP.  Pain is located across the width of the low back and is completely axial.  He denies any neurologic or radicular symptoms involving the lower extremities.  His bowel and bladder function have been normal.  We will        Interval Health History changes:none  Relevant New Positives on 14 point Spine Related Review of Systems: None   Focused Exam Update:  Patient presents today in obvious discomfort.  He is alert and oriented 3.  BMI of 29.5 his gait is performed easily and without assistive device.  Extension is performed to neutral and increases his back complaints.  Flexion is performed much more comfortably.  Motor and sensation are intact    Imaging:  I personally reviewed x-rays taken today in the office. The fusion at L5-S1 is intact.  The implants are well positioned and there is no evidence of hardware failure.  There is significant degenerative change at the level above his fusion that has not progressed since 2015.  When compared to x-rays taken in 2011, degeneration is significantly worse.      Recommendations:  Treatment Options:  Episodic low back pain in the setting of a prior instrumented fusion  20 years ago with recurrent degenerative  change at the level above the fusion    Patient reports that while he does exercise regularly with mostly cardio vascular exercise, he does a little for strengthening or core work.  In light of the degenerative changes seen on x-ray, I think a focused core strengthening program would be beneficial.  He has agreed to revisit with PT for a refresher course.  I've given him a Medrol Dosepak to help with the acute pain he is experiencing at the present time.  He will use the Flexeril sparingly and understands that he should not take it with his lorazepam (used for headaches and anxiety ).  I scheduled him to undergo an MRI later today and will call him at home with the results.  We can discuss any additional care once his MRI results are available.

## 2017-01-04 ENCOUNTER — Telehealth: Payer: Self-pay | Admitting: Optometry

## 2017-01-04 NOTE — Telephone Encounter (Signed)
Patient would like to re-order contacts. Was explained that it is expired and that he would need to come in for an appointment. Was then wondering if he could get a pack to hold him over because he is going to be traveling. Please advise.

## 2017-01-06 NOTE — Telephone Encounter (Signed)
Patient has been called, and message has been left with my line directly to call back in regards to contact lens.

## 2017-01-07 ENCOUNTER — Other Ambulatory Visit: Payer: Self-pay | Admitting: Primary Care

## 2017-01-07 MED ORDER — AMPHETAMINE-DEXTROAMPHETAMINE 10 MG PO CP24 *I*
10.0000 mg | ORAL_CAPSULE | Freq: Every morning | ORAL | 0 refills | Status: DC
Start: 2017-01-07 — End: 2017-02-09

## 2017-01-11 ENCOUNTER — Encounter: Payer: Self-pay | Admitting: Gastroenterology

## 2017-01-14 ENCOUNTER — Encounter: Payer: Self-pay | Admitting: Orthopedic Surgery

## 2017-01-14 NOTE — Progress Notes (Signed)
Telephone conversation to review MRI    This is a 41 year old male who was seen urgently for acute low back pain.  He is 20 years out from an instrumented fusion at L5-S1 performed as an adolescent for spondylolisthesis.  He has been seen intermittently throughout the years for recurrent back pain.  An MRI was obtained for ongoing evaluation.    Imaging studies:  A lumbar MRI obtained at [facility] on 12/30/16 was reviewed.  I contacted the patient on 12/31/16 with his MRI results.  It is remarkable for an unknown instrumented fusion at L5-S1.  At the level above the fusion, L4-L5.  A left/midline posterior disc osteophyte displacing the left L5 nerve root.  It appears similar to images seen on his CT myelogram dated 2012.  Disc desiccation is noted at L4-L5.    I contacted Clemence with his results immediately following his MRI.  He understands there are no worrisome findings and  that I would continue to recommend nonsurgical care consisting of physical therapy, Medrol Dosepak, and Flexeril (to be used sparingly).  Patient is scheduled for a [place] and was relieved to hear there are no worrisome findings.  He will give these measures a try and contact me should we need to pursue any additional care.  I'm hopeful that this flareup will reserve conservative measures.

## 2017-01-25 ENCOUNTER — Other Ambulatory Visit: Payer: Self-pay | Admitting: Primary Care

## 2017-01-25 MED ORDER — ESCITALOPRAM OXALATE 10 MG PO TABS *I*
10.0000 mg | ORAL_TABLET | Freq: Every day | ORAL | 5 refills | Status: DC
Start: 2017-01-25 — End: 2017-07-21

## 2017-02-09 ENCOUNTER — Other Ambulatory Visit: Payer: Self-pay | Admitting: Primary Care

## 2017-02-09 MED ORDER — AMPHETAMINE-DEXTROAMPHETAMINE 10 MG PO CP24 *I*
10.0000 mg | ORAL_CAPSULE | Freq: Every morning | ORAL | 0 refills | Status: DC
Start: 2017-02-09 — End: 2017-03-16

## 2017-02-18 ENCOUNTER — Ambulatory Visit
Admission: AD | Admit: 2017-02-18 | Discharge: 2017-02-18 | Disposition: A | Discharge status: Home / Self Care | Payer: No Typology Code available for payment source | Source: Ambulatory Visit | Attending: Orthopedic Surgery | Admitting: Orthopedic Surgery

## 2017-02-18 DIAGNOSIS — S61316A Laceration without foreign body of right little finger with damage to nail, initial encounter: Secondary | ICD-10-CM

## 2017-02-18 DIAGNOSIS — S61219A Laceration without foreign body of unspecified finger without damage to nail, initial encounter: Secondary | ICD-10-CM

## 2017-02-18 MED ORDER — TETANUS-DIPHTH-ACELL PERT 5-2.5-18.5 LF-MCG/0.5 IM SUSP *WRAPPED*
0.5000 mL | Freq: Once | INTRAMUSCULAR | Status: AC
Start: 2017-02-18 — End: 2017-02-18
  Administered 2017-02-18: 0.5 mL via INTRAMUSCULAR

## 2017-02-18 NOTE — Discharge Instructions (Signed)
You were seen today for laceration to your right pinky finger.  I did place 2 stitches.  It Also involved the nail as well.  Please leave in for approximately 10 days.  Try to avoid getting it wet as much as possible.  Avoid lake, [place] or pool water.  Ice and elevate as needed.  Ibuprofen for pain.  Follow-up with primary care doctor in 10 days for suture removal.

## 2017-02-18 NOTE — UC Provider Note (Signed)
History     Chief Complaint   Patient presents with    Finger Laceration     right pinky finger cut on a drone last night approx 2200, last tetanus 2011     HPI : 41 year old male presents today with injury to his right pinky finger.  He states he was flying is drawn last night and accidentally cut his right pinky finger.  He states it has been painful.  He states that that it has been somewhat difficult for him to control the bleeding.  His last tetanus shot was in 2011.  He is right-hand dominant.  He denies any numbness or tingling in his hand or fingers.    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    Vertigo     Steroid injection into right ear 12/17/2011            Past Surgical History:   Procedure Laterality Date    LEFT MENISCECTOMY  01/2012    Medial & Lateral    LUMBAR FUSION      Lumbar Vertebral Fusion Conversion Data     SPINAL FUSION  1999    L5-S1    TYMPANOSTOMY TUBE PLACEMENT         Family History   Problem Relation Age of Onset    High cholesterol Father     Hypertension Father     Conversion Other      [id number]^Family Health Status^^Active^Father(A)HTN.Chol.Allergies Mother(A)Migraines 1 Brother(A)ADD 2 Sisters(A) 1 Daughter(A) PatGF(A)CAD.Glaucoma  DM2.Chol PatGM(D)49.MI MatGF(D)60s.Bone Ca MatGM(A)    Hearing loss Maternal Grandmother          Social History    reports that he quit smoking about 11 years ago. His smoking use included Cigarettes. He has a 6.00 pack-year smoking history. He has never used smokeless tobacco. He reports that he drinks alcohol. He reports that he currently engages in sexual activity and has had male  partners. He reports that he does not use illicit drugs.    Living Situation     Questions Responses    Patient lives with Family    Homeless No    Caregiver for other family member No    External Services None    Employment Employed    Comment: Marine scientistfuneral director     Domestic Violence Risk No          Review of Systems   Review of Systems   Constitutional: Negative.    HENT: Negative.    Eyes: Negative.    Respiratory: Negative.    Cardiovascular: Negative.    Gastrointestinal: Negative.    Genitourinary: Negative.    Musculoskeletal: Negative.    Skin: Positive for wound.        Distal aspect of right pinky finger   Neurological: Negative.        Physical Exam       ED Triage Vitals   BP Heart Rate Heart Rate (via Pulse Ox) Resp Temp Temp src SpO2 (Retired) O2 Device O2 Flow Rate   02/18/17 0920 02/18/17 0920 -- 02/18/17 0920 02/18/17 0920 02/18/17 0920 02/18/17 0920 -- --  134/91 76  16 37.1 C (98.8 F) TEMPORAL 96 %        Weight           --                               Physical Exam   Constitutional: He is oriented to person, place, and time. He appears well-developed and well-nourished.   HENT:   Head: Normocephalic and atraumatic.   Eyes: EOM are normal. Pupils are equal, round, and reactive to light.   Neck: Normal range of motion. Neck supple.   Cardiovascular: Normal rate, regular rhythm and normal heart sounds.    Pulmonary/Chest: Effort normal and breath sounds normal.   Abdominal: Soft. Bowel sounds are normal.   Musculoskeletal: Normal range of motion.   Neurological: He is alert and oriented to person, place, and time.   Skin: Skin is warm and dry.   2 cm laceration at the distal aspect of the right pinky finger extending through the medial aspect of the nail.  Nail is still intact.  He has full flexion and extension of the digit including other phalanges.  No pain with range of motion of the wrist.  He is distally neurovascularly intact.   Nursing note and vitals reviewed.       Medical Decision  Making        Initial Evaluation:  ED First Provider Contact     Date/Time Event User Comments    02/18/17 [phone] ED First Provider Contact Clelia Croft A Initial Face to Face Provider Contact          Patient was seen on: 02/18/2017        Assessment:  40 y.o.male comes to the Urgent Care Center with laceration to right pinky finger involving the nail, distal aspect.     differential Diagnosis includes laceration, abrasion, contusion.                Plan: You were seen today for laceration to your right pinky finger.  I did place 2 stitches.  It Also involved the nail as well.  Please leave in for approximately 10 days.  Try to avoid getting it wet as much as possible.  Avoid lake, [place] or pool water.  Ice and elevate as needed.  Ibuprofen for pain.  Follow-up with primary care doctor in 10 days for suture removal.      Final Diagnosis  Final diagnoses:   [R60.454U] Finger laceration, initial encounter (Primary)           Clelia Croft, PA    Supervising physician Jamie Brookes, MD was immediately available     Clelia Croft, [place]  02/18/17 1003

## 2017-02-18 NOTE — ED Notes (Signed)
Reviewed discharge paperwork with patient. All questioned answered, patient verbalized understanding. Patient left with all belongings.

## 2017-02-18 NOTE — ED Procedure Documentation (Signed)
Procedures   Laceration repair  Date/Time: 02/18/2017 10:03 AM  Performed by: Clelia CroftSCOTT, Jakylan Ron  Authorized by: Clelia CroftSCOTT, Coolidge Gossard     Consent:     Consent obtained:  Verbal    Consent given by:  Patient    Risks discussed:  Infection, pain and poor wound healing    Alternatives discussed:  Delayed treatment  Universal protocol:     Procedure explained and questions answered to patient or proxy's satisfaction: yes      Site/side marked: yes      Patient identity confirmed:  Verbally with patient  Anesthesia (see MAR for exact dosages):     Anesthesia method:  Local infiltration and nerve block    Local anesthetic:  Lidocaine 1% w/o epi    Block needle gauge:  25 G    Block anesthetic:  Lidocaine 1% w/o epi    Block injection procedure:  Anatomic landmarks identified, introduced needle, incremental injection and negative aspiration for blood    Block outcome:  Anesthesia achieved  Laceration details:     Location:  Finger    Finger location:  R small finger    Length (cm):  2  Pre-procedure details:     Preparation:  Patient was prepped and draped in usual sterile fashion  Exploration:     Limited defect created (wound extended): yes      Hemostasis achieved with:  Cautery and direct pressure    Contaminated: no    Treatment:     Area cleansed with:  Chlorhexidine    Amount of cleaning:  Standard    Irrigation solution:  Sterile saline    Irrigation method:  Syringe    Visualized foreign bodies/material removed: no      Debridement:  None    Undermining:  None    Scar revision: no    Skin repair:     Repair method:  Sutures    Suture size:  4-0    Suture material:  Prolene    Suture technique:  Simple interrupted    Number of sutures:  3  Approximation:     Approximation:  Close    Vermilion border: well-aligned    Post-procedure details:     Dressing:  Antibiotic ointment and non-adherent dressing    Patient tolerance of procedure:  Tolerated well, no immediate complications        Clelia CroftKERRY Yu [place], PA     Clelia CroftScott, [place] Gudiel, [place]  02/18/17  1005

## 2017-03-16 ENCOUNTER — Encounter: Payer: Self-pay | Admitting: Primary Care

## 2017-03-16 ENCOUNTER — Ambulatory Visit: Payer: No Typology Code available for payment source | Attending: Primary Care | Admitting: Primary Care

## 2017-03-16 VITALS — BP 130/70 | HR 76 | Ht 69.0 in | Wt 205.0 lb

## 2017-03-16 DIAGNOSIS — F988 Other specified behavioral and emotional disorders with onset usually occurring in childhood and adolescence: Secondary | ICD-10-CM

## 2017-03-16 DIAGNOSIS — F32A Depression, unspecified: Secondary | ICD-10-CM

## 2017-03-16 DIAGNOSIS — F419 Anxiety disorder, unspecified: Secondary | ICD-10-CM

## 2017-03-16 DIAGNOSIS — E785 Hyperlipidemia, unspecified: Secondary | ICD-10-CM

## 2017-03-16 DIAGNOSIS — F329 Major depressive disorder, single episode, unspecified: Secondary | ICD-10-CM

## 2017-03-16 DIAGNOSIS — I10 Essential (primary) hypertension: Secondary | ICD-10-CM

## 2017-03-16 MED ORDER — AMPHETAMINE-DEXTROAMPHETAMINE 10 MG PO CP24 *I*
10.0000 mg | ORAL_CAPSULE | Freq: Every morning | ORAL | 0 refills | Status: DC
Start: 2017-03-16 — End: 2017-04-15

## 2017-03-16 NOTE — Progress Notes (Signed)
Chief Complaint   Patient presents with    Hypertension    Anxiety    ADHD    Hyperlipidemia    Depression     Current Outpatient Prescriptions   Medication Sig Dispense Refill    amphetamine-dextroamphetamine (ADDERALL XR) 10 MG 24 hr capsule Take 1 capsule (10 mg total) by mouth every morning   Max daily dose: 10 mg May sprinkle contents of capsule on food, do not chew capsule. 30 capsule 0    escitalopram (LEXAPRO) 10 MG tablet Take 1 tablet (10 mg total) by mouth daily 30 tablet 5    rosuvastatin (CRESTOR) 10 MG tablet Take 1 tablet (10 mg total) by mouth daily 30 tablet 5    cholecalciferol (VITAMIN D) 2000 UNITS tablet Take 2,000 Units by mouth 2 times daily      clonazePAM (KLONOPIN) 0.5 MG tablet Take 0.5 mg by mouth 2 times daily   MDD 1 mg      Multiple Vitamin (MULTIVITAMIN) TABS Take 1 tablet by mouth daily       No current facility-administered medications for this visit.      Medications reviewed and no changes made     HYPERTENSION MANAGEMENT:     BP 130/70   Pulse 76   Ht 1.753 m ( )   Wt 93 kg (205 lb)   BMI 30.27 kg/m2        PATIENT DENIES:  Headaches.  Chest Pain.  Palpitations.  Shortness of Breath.    Father has HTN     HABITS:   --Sodium-   Yes  No          --Exercise-  Yes sporadic biking-will bike to and from work  No          --Smoking-  Yes  No          --Caffeine Use- Yes 4 cups coffee/d  No           HOME BLOOD PRESSURE: not taking    CHOLESTEROL MANAGEMENT:      DIET:       --Eggs:   Yes 4week  No          --Fast Food:  Yes  No, but sporadic pizza          --Junk Food:  Yes minimal  No           --Milk:   none        --Butter:   none       --Fiber:   Yes has a fruit smoothie daily and eats veges for dinner  No        MEDICATIONS:  On Crestor    He uses Adderall on work days which could be 5-7 days/week. He takes it at [facility] and feels it starting to wear off by lunch time. It helps him focus/be productive    He has a lot of  work stress and feels overwhelmed at times.  Exercise helps relax him as does reading.  His wife is pregnant with their 3rd child-due in October. Has less marital stress and is communicating better with his wife. He just went to [place] for vacation.  Not suicidal.  Not needing to see his counselor.     EXAM:    AFFECT: pleasant and cheerful in NAD  HEART: Regular rate and rhythm, no murmurs gallops or rubs.  LUNGS: Clear to auscultation bilaterally.       ASSESSMENT/PLAN:    1.  ZOX:[id number] well-controlled  and at goal of less than 140/90.  Diet and exercise reviewed.  He is currently on no medicine.  Check SMA-8  2.  Hyperlipidemia: His last profile was at goal on Crestor.  Diet and exercise reviewed.  Check lipid profile/LFTs  3.  ADD: Currently stable on Adderall.  Minimize caffeine  4.  Anxiety/depression: Doing better.  Continue Lexapro.  He also takes Klonopin.  He sees his counselor as needed.    RETURN: 6 months HTN/cholesterol/anxiety/depression/ADD

## 2017-04-15 ENCOUNTER — Other Ambulatory Visit: Payer: Self-pay | Admitting: Primary Care

## 2017-04-15 MED ORDER — AMPHETAMINE-DEXTROAMPHETAMINE 10 MG PO CP24 *I*
10.0000 mg | ORAL_CAPSULE | Freq: Every morning | ORAL | 0 refills | Status: DC
Start: 2017-04-15 — End: 2017-05-13

## 2017-04-15 NOTE — Telephone Encounter (Signed)
Prescription Reports  You just saved 19.95 seconds of typing!   Please direct any questions regarding prescription data displayed on Carlos PMP Registry to Carlos dispensing Hartman. Carlos dispenser is Carlos source of all data presented. Only Carlos dispenser can confirm or modify Carlos data reported to, and displayed on, Carlos PMP Registry.   Carlos SharperJosh Hartman - 16- 16 Prescriptions  Confidential Drug Report  Search Terms: Carlos SharperJosh Hartman, [birth year]   Search Date: 04/15/2017 01:35:26 PM   Searching on behalf of: [name] - Carlos LifeKathryn Hartman Hartman   Carlos Drug Utilization Report below displays all of Carlos controlled substance prescriptions, if any, that your patient has filled in Carlos last twelve months. Carlos information displayed on this report is compiled from Hartman submissions to Carlos Department, and accurately reflects Carlos information as submitted by Carlos pharmacies.  This report was requested by: [name]   Reference #: [id number]   Others' Prescriptions  Patient Name: Carlos DerJoshua Hartman Birth Date: [birth year]   Address: [address] [place], [place] [id number] Sex: Male   Rx Written Rx Dispensed Drug Quantity Days Supply Prescriber Name Payment Method Dispenser   03/16/2017 03/17/2017 dextroamp-amphet er 10 mg cap  30 30 [name], [name] Insurance [name]   02/15/2017 02/15/2017 clonazepam 0.5 mg tablet  90 30 [name], [name] Insurance [name]   02/09/2017 02/10/2017 dextroamp-amphet er 10 mg cap  30 30 [name], [name] Insurance [name]   01/07/2017 01/08/2017 dextroamp-amphet er 10 mg cap  30 30 [name], [name] Insurance [name]   12/22/2016 12/23/2016 clonazepam 0.5 mg tablet  90 30 [name], [name] Insurance [name]   12/08/2016 12/09/2016 dextroamp-amphet er 10 mg cap  30 30 [name], [name] Insurance [name]   10/27/2016 11/02/2016 dextroamp-amphet er 10 mg cap  30 30  [name], [name] Insurance [name]   10/27/2016 11/02/2016 clonazepam 0.5 mg tablet  90 30 [name], [name] [place] [employer] [name]   09/23/2016 09/25/2016 dextroamp-amphet er 10 mg cap  30 30 [name], [name] Insurance [name]   09/10/2016 09/11/2016 clonazepam 0.5 mg tablet  90 30 [name], [name] [place] [employer] [name]   07/21/2016 07/22/2016 clonazepam 0.5 mg tablet  90 30 [name], [name] [place] [employer] [name]   07/03/2016 07/04/2016 dextroamp-amphet er 10 mg cap  30 30 [name], [name] Insurance [name]   06/05/2016 06/06/2016 clonazepam 0.5 mg tablet  90 30 [name], [name] [place] [employer] [name]   05/26/2016 05/27/2016 dextroamp-amphet er 10 mg cap  30 30 Carlos Fowlerietropaoli, [name] Insurance [name]   04/20/2016 04/21/2016 dextroamp-amphet er 10 mg cap  30 30 [name], [name] Insurance [name]   04/20/2016 04/21/2016 clonazepam 0.5 mg tablet  90 30 [name], [name] Insurance [name]   * - Drugs marked with an asterisk are compound drugs. If Carlos compound drug is made up of more than one controlled substance, then each controlled substance will be Hartman separate row in Carlos table.      Report Suspicious Activity   Send Questions/Comments   Substance Abuse Treatment Information     Click Carlos Report Suspicious Activity button to report information related to controlled substance suspicious activity to Carlos [employer] of [employer]arcotic Enforcement.   Click Carlos  Send Questions/Comments button to send questions about this report to Carlos [name] Carlos of Narcotic Enforcement, or call [phone].   Click Carlos Substance Abuse Treatment Information button to go to Carlos Office of Alcoholism and Substance Abuse Services website, www.oasas.thelispenard.com or call [phone].

## 2017-05-13 ENCOUNTER — Other Ambulatory Visit: Payer: Self-pay | Admitting: Primary Care

## 2017-05-13 ENCOUNTER — Telehealth: Payer: Self-pay | Admitting: Primary Care

## 2017-05-13 MED ORDER — AMPHETAMINE-DEXTROAMPHETAMINE 15 MG PO CP24 *I*
15.0000 mg | ORAL_CAPSULE | Freq: Every morning | ORAL | 0 refills | Status: DC
Start: 2017-05-13 — End: 2017-06-15

## 2017-05-13 NOTE — Telephone Encounter (Signed)
Prescription Reports  You just saved 19.95 seconds of typing!   Please direct any questions regarding prescription data displayed on the PMP Registry to the dispensing pharmacy. The dispenser is the source of all data presented. Only the dispenser can confirm or modify the data reported to, and displayed on, the PMP Registry.   Carlos SharperJosh Collinsworth - 98- 16 Prescriptions  Confidential Drug Report  Search Terms: Carlos SharperJosh Hartman, Jan 31, 1976   Search Date: 05/13/2017 10:05:07 AM   Searching on behalf of: [name] - [name]   The Drug Utilization Report below displays all of the controlled substance prescriptions, if any, that your patient has filled in the last twelve months. The information displayed on this report is compiled from pharmacy submissions to the Department, and accurately reflects the information as submitted by the pharmacies.  This report was requested by: [name]   Reference #: [id number]   Others' Prescriptions  Patient Name: [name] Birth Date: Jan 31, 1976   Address: [address] [place], [place] [id number] Sex: Male   Rx Written Rx Dispensed Drug Quantity Days Supply Prescriber Name Payment Method Dispenser   04/15/2017 04/16/2017 dextroamp-amphet er 10 mg cap  30 30 [name], [name] Insurance [facility]   04/15/2017 04/16/2017 clonazepam 0.5 mg tablet  90 30 [name], [name] [facility]   03/16/2017 03/17/2017 dextroamp-amphet er 10 mg cap  30 30 [name], [name] [facility]   02/15/2017 02/15/2017 clonazepam 0.5 mg tablet  90 30 [name], [name] [facility]   02/09/2017 02/10/2017 dextroamp-amphet er 10 mg cap  30 30 [name], [name] [facility]   01/07/2017 01/08/2017 dextroamp-amphet er 10 mg cap  30 30 [name], [name] [facility]   12/22/2016 12/23/2016 clonazepam 0.5 mg tablet  90 30 [name], Brandon  A PA Insurance [facility]   12/08/2016 12/09/2016 dextroamp-amphet er 10 mg cap  30 30 [name], [name] [facility]   10/27/2016 11/02/2016 dextroamp-amphet er 10 mg cap  30 30 [name], [name] [facility]   10/27/2016 11/02/2016 clonazepam 0.5 mg tablet  90 30 [name], [name] [employer] [facility]   09/23/2016 09/25/2016 dextroamp-amphet er 10 mg cap  30 30 [name], [name] [facility]   09/10/2016 09/11/2016 clonazepam 0.5 mg tablet  90 30 [name], [name] [facility]   07/21/2016 07/22/2016 clonazepam 0.5 mg tablet  90 30 [name], [name] [facility]   07/03/2016 07/04/2016 dextroamp-amphet er 10 mg cap  30 30 [name], [name] [facility]   06/05/2016 06/06/2016 clonazepam 0.5 mg tablet  90 30 [name], [name] [facility]   05/26/2016 05/27/2016 dextroamp-amphet er 10 mg cap  30 30 [name], [name] Insurance [facility]   * - Drugs marked with an asterisk are compound drugs. If the compound drug is made up of more than one controlled substance, then each controlled substance will be a separate row in the table.      Report Suspicious Activity   Send Questions/Comments   Substance Abuse Treatment Information     Click the Report Suspicious Activity button to report information related to controlled substance suspicious activity to the [employer] of Narcotic Enforcement.  Click the Send Questions/Comments button to send questions about this report to the [facility] of Narcotic Enforcement, or call [phone].   Click the Substance Abuse Treatment Information button to go to the Office of Alcoholism and Substance Abuse Services website, www.oasas.FuelStrike.hu or call [phone].    2018 [facility]

## 2017-05-13 NOTE — Telephone Encounter (Signed)
Pt called and had asked for a refill of his adderall but is requesting the medication be twice daily.  Per MD called patient and explained it is XR and can not be taken twice daily Also per MD also explained to him he can increase new refill to 15 mg XR but will need a follow up in 3-4 weeks to monitor BP.  Pt is agreeable and making the appointment. [facility].

## 2017-05-18 ENCOUNTER — Other Ambulatory Visit: Payer: Self-pay | Admitting: Primary Care

## 2017-05-18 MED ORDER — ROSUVASTATIN CALCIUM 10 MG PO TABS *I*
10.0000 mg | ORAL_TABLET | Freq: Every day | ORAL | 5 refills | Status: DC
Start: 2017-05-18 — End: 2017-11-17

## 2017-05-31 ENCOUNTER — Other Ambulatory Visit
Admission: RE | Admit: 2017-05-31 | Discharge: 2017-05-31 | Disposition: A | Discharge status: Home / Self Care | Payer: No Typology Code available for payment source | Source: Ambulatory Visit | Attending: Primary Care | Admitting: Primary Care

## 2017-05-31 DIAGNOSIS — E785 Hyperlipidemia, unspecified: Secondary | ICD-10-CM

## 2017-05-31 DIAGNOSIS — I10 Essential (primary) hypertension: Secondary | ICD-10-CM | POA: Insufficient documentation

## 2017-05-31 LAB — BASIC METABOLIC PANEL
Anion Gap: 12 (ref 7–16)
CO2: 30 mmol/L — ABNORMAL HIGH (ref 20–28)
Calcium: 10.1 mg/dL (ref 9.0–10.3)
Chloride: 99 mmol/L (ref 96–108)
Creatinine: 0.9 mg/dL (ref 0.67–1.17)
GFR,Black: 123 *
GFR,Caucasian: 107 *
Glucose: 91 mg/dL (ref 60–99)
Lab: 18 mg/dL (ref 6–20)
Potassium: 4.8 mmol/L (ref 3.3–5.1)
Sodium: 141 mmol/L (ref 133–145)

## 2017-05-31 LAB — LIPID PANEL
Chol/HDL Ratio: 3.3
Cholesterol: 216 mg/dL — AB
HDL: 66 mg/dL
LDL Calculated: 123 mg/dL
Non HDL Cholesterol: 150 mg/dL
Triglycerides: 133 mg/dL

## 2017-05-31 LAB — GGT: GGT: 51 U/L (ref 8–61)

## 2017-05-31 LAB — ALT: ALT: 31 U/L (ref 0–50)

## 2017-05-31 LAB — AST: AST: 31 U/L (ref 0–50)

## 2017-06-02 ENCOUNTER — Encounter: Payer: Self-pay | Admitting: Primary Care

## 2017-06-02 ENCOUNTER — Ambulatory Visit: Payer: No Typology Code available for payment source | Attending: Primary Care | Admitting: Primary Care

## 2017-06-02 VITALS — BP 120/80 | HR 60 | Ht 69.0 in | Wt 205.0 lb

## 2017-06-02 DIAGNOSIS — F988 Other specified behavioral and emotional disorders with onset usually occurring in childhood and adolescence: Secondary | ICD-10-CM

## 2017-06-02 DIAGNOSIS — I10 Essential (primary) hypertension: Secondary | ICD-10-CM

## 2017-06-02 NOTE — Progress Notes (Signed)
Chief Complaint   Patient presents with    Hypertension    ADD     Vitals:    06/02/17 0721   BP: 120/80   Pulse: 60   Weight: 93 kg (205 lb)   Height: 1.753 m (5\' 9" )       He called us 05/13/17 to request an increase in Adderall XR 10mg .  He felt it was wearing off early in the afternoon.  Adderall XR was increased to 15mg  and he has been taking it daily since ~05/15/17.  He takes it at 5:30AM and feels it lasts longer.  He feels more focused until ~4PM.  He tends to go to a coffee shop late in the day for a change in environment to do work.  Has 2-3 cups coffee/d.  No palpitations.  Has occasional insomnia which is no worse. He bikes to work.  Baby #3 is due 09/2017.     EXAM:    AFFECT:  Pleasant and cheerful in NAD  HEART: Regular rate and rhythm, no murmurs gallops or rubs.  LUNGS: Clear to auscultation bilaterally.    ASSESSMENT/PLAN:    1.  ADD: Doing better on higher doses Adderall.  No side effects.  Follow.  Minimize caffeine  2.  WJX:BJYNWGNFAHTN:Presently well-controlled and at goal of less than 140/90.  Diet and exercise reviewed.  Higher dose Adderall has not affected his blood pressure.  Continue diet and exercise.  3.  Health maintenance: Recent labs reviewed.  His cholesterol was mildly elevated.  Diet reviewed.  Continue exercising.  He is taking his Crestor daily.    RETURN: 09/15/17 HTN/cholesterol/ADD/anxiety/depression

## 2017-06-15 ENCOUNTER — Other Ambulatory Visit: Payer: Self-pay | Admitting: Primary Care

## 2017-06-15 MED ORDER — AMPHETAMINE-DEXTROAMPHETAMINE 15 MG PO CP24 *I*
15.0000 mg | ORAL_CAPSULE | Freq: Every morning | ORAL | 0 refills | Status: DC
Start: 2017-06-15 — End: 2017-07-20

## 2017-07-11 ENCOUNTER — Telehealth: Payer: Self-pay | Admitting: Primary Care

## 2017-07-11 ENCOUNTER — Other Ambulatory Visit: Payer: Self-pay | Admitting: Primary Care

## 2017-07-11 DIAGNOSIS — F32A Depression, unspecified: Secondary | ICD-10-CM

## 2017-07-11 DIAGNOSIS — E782 Mixed hyperlipidemia: Secondary | ICD-10-CM

## 2017-07-11 MED ORDER — ROSUVASTATIN CALCIUM 10 MG PO TABS *I*
10.0000 mg | ORAL_TABLET | Freq: Every day | ORAL | 0 refills | Status: DC
Start: 2017-07-11 — End: 2017-09-15

## 2017-07-11 MED ORDER — ESCITALOPRAM OXALATE 10 MG PO TABS *I*
10.0000 mg | ORAL_TABLET | Freq: Every day | ORAL | 0 refills | Status: DC
Start: 2017-07-11 — End: 2017-07-21

## 2017-07-11 NOTE — Telephone Encounter (Signed)
Patient is currently on a trip and [place]  Unfortunately, he left his antidepressant and cholesterol medications behind.  Both his Lexapro and his rosuvastatin were sent [facility] [place].

## 2017-07-20 ENCOUNTER — Other Ambulatory Visit: Payer: Self-pay | Admitting: Primary Care

## 2017-07-20 MED ORDER — AMPHETAMINE-DEXTROAMPHETAMINE 15 MG PO CP24 *I*
15.0000 mg | ORAL_CAPSULE | Freq: Every morning | ORAL | 0 refills | Status: DC
Start: 2017-07-20 — End: 2017-09-15

## 2017-07-21 ENCOUNTER — Other Ambulatory Visit: Payer: Self-pay | Admitting: Primary Care

## 2017-08-16 ENCOUNTER — Other Ambulatory Visit: Payer: Self-pay | Admitting: Primary Care

## 2017-09-15 ENCOUNTER — Ambulatory Visit: Payer: No Typology Code available for payment source | Attending: Primary Care | Admitting: Primary Care

## 2017-09-15 ENCOUNTER — Encounter: Payer: Self-pay | Admitting: Primary Care

## 2017-09-15 ENCOUNTER — Other Ambulatory Visit: Payer: Self-pay | Admitting: Primary Care

## 2017-09-15 VITALS — BP 120/80 | HR 76 | Ht 69.0 in | Wt 209.0 lb

## 2017-09-15 DIAGNOSIS — I10 Essential (primary) hypertension: Secondary | ICD-10-CM

## 2017-09-15 DIAGNOSIS — F329 Major depressive disorder, single episode, unspecified: Secondary | ICD-10-CM

## 2017-09-15 DIAGNOSIS — E785 Hyperlipidemia, unspecified: Secondary | ICD-10-CM

## 2017-09-15 DIAGNOSIS — F419 Anxiety disorder, unspecified: Secondary | ICD-10-CM

## 2017-09-15 DIAGNOSIS — F32A Depression, unspecified: Secondary | ICD-10-CM

## 2017-09-15 DIAGNOSIS — Z23 Encounter for immunization: Secondary | ICD-10-CM

## 2017-09-15 DIAGNOSIS — F988 Other specified behavioral and emotional disorders with onset usually occurring in childhood and adolescence: Secondary | ICD-10-CM

## 2017-09-15 MED ORDER — AMPHETAMINE-DEXTROAMPHETAMINE 15 MG PO CP24 *I*
15.0000 mg | ORAL_CAPSULE | Freq: Every morning | ORAL | 0 refills | Status: DC
Start: 2017-09-15 — End: 2017-10-15

## 2017-09-15 NOTE — Progress Notes (Signed)
Chief Complaint   Patient presents with    Hypertension    Hyperlipidemia    ADHD    Depression     Current Outpatient Prescriptions   Medication Sig Dispense Refill    escitalopram (LEXAPRO) 10 MG tablet TAKE 1 TABLET BY MOUTH DAILY 30 tablet 5    amphetamine-dextroamphetamine (ADDERALL XR) 15 MG 24 hr capsule Take 1 capsule (15 mg total) by mouth every morning   Max daily dose: 15 mg May sprinkle contents of capsule on food, do not chew capsule. 30 capsule 0    rosuvastatin (CRESTOR) 10 MG tablet Take 1 tablet (10 mg total) by mouth daily 30 tablet 5    cholecalciferol (VITAMIN D) 2000 UNITS tablet Take 2,000 Units by mouth 2 times daily      clonazePAM (KLONOPIN) 0.5 MG tablet Take 0.5 mg by mouth 2 times daily   MDD 1 mg      Multiple Vitamin (MULTIVITAMIN) TABS Take 1 tablet by mouth daily       No current facility-administered medications for this visit.      Medications reviewed and no changes made     HYPERTENSION MANAGEMENT:  Chronic and stable    BP 120/80   Pulse 76   Ht 1.753 m ( )   Wt 94.8 kg (209 lb)   BMI 30.86 kg/m2        PATIENT DENIES:  Headaches.  Chest Pain.  Palpitations.  Shortness of Breath.    HABITS:   --Sodium-   Yes  No          --Exercise-  Yes  None in a few months-wife is due any day for baby girl any day. He has gained 12 lbs in the past year         --Smoking-  Yes  No          --Caffeine Use- Yes 2 cups coffee/d  No             HOME BLOOD PRESSURE: normal    CHOLESTEROL MANAGEMENT:  Chronic and stable     DIET:       --Eggs:   Yes 4/week  No          --Fast Food:  Yes  No          --Junk Food:  Yes minimal  No           --Milk:   none        --Butter:   none       --Fiber:   Yes only veges for dinner, eats wheat bread.   No       MEDICATIONS:  On Crestor and taking it     He has a baby girl due any day and he is coping well.  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  This frees up some time for him.  Eating and sleeping well. Not suicidal. Using Less Klonopin-was BID and now only uses it in the morning.     He has chronic ADD which is stable with Adderall-uses it on work days which maintains his concentration. No dry mouth/palpitations/insomnia      EXAM:     AFFECT:  Pleasant and cheerful in NAD  HEART: Regular rate and rhythm, no murmurs gallops or rubs.  LUNGS: Clear to auscultation bilaterally.     ASSESSMENT/PLAN:    1. HTN:   Presently well-controlled and at  goal of less than 140/90.  Diet and exercise reviewed. On no meds.   2.  Hyperlipidemia:   Last profile was not at goal.  He needs to eat more fruit/veges and resume exercising.  He may need higher Crestor.  Check FLP/LFTs  3.  Anxiety/depression: Well controlled on Lexapro.  He is using less Klonopin.  He will try to resume exercising  4.  ADD: Doing well on Adderall.  5.  Health maintenance: Flu shot given    RETURN: 5 months HTN/cholesterol/ADD/anxiety

## 2017-09-28 ENCOUNTER — Telehealth: Payer: Self-pay | Admitting: Primary Care

## 2017-09-28 NOTE — Telephone Encounter (Signed)
Calling for a referral to a new Neurologist for his Migraine treatment.  DX: Migraine  Referral to Basilia JumboAnthony Maroldo.  Thanks.

## 2017-09-29 ENCOUNTER — Other Ambulatory Visit: Payer: Self-pay | Admitting: Primary Care

## 2017-09-29 DIAGNOSIS — G43909 Migraine, unspecified, not intractable, without status migrainosus: Secondary | ICD-10-CM

## 2017-10-07 ENCOUNTER — Encounter: Payer: Self-pay | Admitting: Primary Care

## 2017-10-15 ENCOUNTER — Other Ambulatory Visit: Payer: Self-pay | Admitting: Primary Care

## 2017-10-15 MED ORDER — AMPHETAMINE-DEXTROAMPHETAMINE 15 MG PO CP24 *I*
15.0000 mg | ORAL_CAPSULE | Freq: Every morning | ORAL | 0 refills | Status: DC
Start: 2017-10-15 — End: 2017-11-17

## 2017-11-09 ENCOUNTER — Telehealth: Payer: Self-pay | Admitting: Urology

## 2017-11-09 NOTE — Telephone Encounter (Signed)
[facility] called, patient is scheduled for a Vasectomy Consult on:    Consult Date: 7/12        VAS Date: 7/26  Provider: Dr. Ron AgeeGentile    Is this appointment  over 2 weeks out? yes  Does the patient want a sooner appointment if available? yes  Is the patient ok with waiting until the date scheduled to be seen? yes    The patient can be reached at [phone] if necessary.

## 2017-11-17 ENCOUNTER — Other Ambulatory Visit: Payer: Self-pay | Admitting: Primary Care

## 2017-11-17 MED ORDER — ROSUVASTATIN CALCIUM 10 MG PO TABS *I*
10.0000 mg | ORAL_TABLET | Freq: Every day | ORAL | 5 refills | Status: DC
Start: 2017-11-17 — End: 2018-01-25

## 2017-11-17 MED ORDER — AMPHETAMINE-DEXTROAMPHETAMINE 15 MG PO CP24 *I*
15.0000 mg | ORAL_CAPSULE | Freq: Every morning | ORAL | 0 refills | Status: DC
Start: 2017-11-17 — End: 2017-12-28

## 2017-12-13 ENCOUNTER — Encounter: Payer: Self-pay | Admitting: Primary Care

## 2017-12-16 ENCOUNTER — Encounter: Payer: Self-pay | Admitting: Primary Care

## 2017-12-20 ENCOUNTER — Ambulatory Visit: Payer: No Typology Code available for payment source | Admitting: Primary Care

## 2017-12-28 ENCOUNTER — Other Ambulatory Visit: Payer: Self-pay | Admitting: Primary Care

## 2017-12-28 MED ORDER — ESCITALOPRAM OXALATE 10 MG PO TABS *I*
ORAL_TABLET | ORAL | 1 refills | Status: DC
Start: 2017-12-28 — End: 2017-12-31

## 2017-12-28 MED ORDER — AMPHETAMINE-DEXTROAMPHETAMINE 15 MG PO CP24 *I*
15.0000 mg | ORAL_CAPSULE | Freq: Every morning | ORAL | 0 refills | Status: DC
Start: 2017-12-28 — End: 2018-02-07

## 2017-12-31 ENCOUNTER — Ambulatory Visit: Payer: No Typology Code available for payment source | Attending: Primary Care | Admitting: Primary Care

## 2017-12-31 ENCOUNTER — Encounter: Payer: Self-pay | Admitting: Primary Care

## 2017-12-31 VITALS — BP 112/80 | HR 72 | Ht 69.0 in | Wt 215.0 lb

## 2017-12-31 DIAGNOSIS — E663 Overweight: Secondary | ICD-10-CM

## 2017-12-31 DIAGNOSIS — F32A Depression, unspecified: Secondary | ICD-10-CM

## 2017-12-31 DIAGNOSIS — F329 Major depressive disorder, single episode, unspecified: Secondary | ICD-10-CM

## 2017-12-31 MED ORDER — ESCITALOPRAM OXALATE 20 MG PO TABS *I*
20.0000 mg | ORAL_TABLET | Freq: Every day | ORAL | 5 refills | Status: DC
Start: 2017-12-31 — End: 2018-02-07

## 2017-12-31 NOTE — NoShare Progress Note (Signed)
Chief Complaint   Patient presents with    Depression     Current Outpatient Prescriptions   Medication Sig Dispense Refill    escitalopram (LEXAPRO) 10 MG tablet Take two tablets by mouth daily 60 tablet 1    amphetamine-dextroamphetamine (ADDERALL XR) 15 MG 24 hr capsule Take 1 capsule (15 mg total) by mouth every morning   Max daily dose: 15 mg May sprinkle contents of capsule on food, do not chew capsule. 30 capsule 0    rosuvastatin (CRESTOR) 10 MG tablet Take 1 tablet (10 mg total) by mouth daily 30 tablet 5    cholecalciferol (VITAMIN D) 2000 UNITS tablet Take 2,000 Units by mouth 2 times daily      clonazePAM (KLONOPIN) 0.5 MG tablet Take 0.5 mg by mouth 2 times daily   MDD 1 mg      Multiple Vitamin (MULTIVITAMIN) TABS Take 1 tablet by mouth daily       No current facility-administered medications for this visit.      He has depression and requested increasing Lexapro from 10mg  to 20mg  a day on 12/13/17 which it was.  He feels less depressed and has no side effects. He sees his counselor Dr Emerson MonteSamia Markson once a week who helps.   Work stress is the main trigger for his depression. Being overwhelmed at work has improved with his Manufacturing engineerpersonal assistant. He no longer is going back to work after dinner like he used to.  He and his wife just had their 3rd daughter in October.  He has less free time for himself. He likes to snow ski and has taken 2 of his kids skiing 3x so far. He has not been exercising as much due to work hours-likes to jog/bike.   Appetite is good.  Sleeping well. Not suicidal. He gained 6 lbs since 09/2017.     EXAM    AFFECT: Pleasant and cheerful in no acute distress    Assessment/Plan:    1.  Depression: Improved on higher dose Lexapro.  Continue seeing his counselor.  Supportive counseling given.  Coping strategies and relaxation techniques reviewed.  Call if worse.  He has gained some weight which will have to be followed.  He is aware of this and will try to lose some weight by the next  visit    RETURN: 02/2018 HTN/cholesterol/depression/ADD

## 2018-01-25 ENCOUNTER — Other Ambulatory Visit: Payer: Self-pay | Admitting: Primary Care

## 2018-01-25 MED ORDER — ROSUVASTATIN CALCIUM 10 MG PO TABS *I*
10.0000 mg | ORAL_TABLET | Freq: Every day | ORAL | 5 refills | Status: DC
Start: 2018-01-25 — End: 2018-03-07

## 2018-02-07 ENCOUNTER — Other Ambulatory Visit: Payer: Self-pay | Admitting: Primary Care

## 2018-02-07 MED ORDER — AMPHETAMINE-DEXTROAMPHETAMINE 15 MG PO CP24 *I*
15.0000 mg | ORAL_CAPSULE | Freq: Every morning | ORAL | 0 refills | Status: DC
Start: 2018-02-07 — End: 2018-03-07

## 2018-02-07 MED ORDER — ESCITALOPRAM OXALATE 20 MG PO TABS *I*
20.0000 mg | ORAL_TABLET | Freq: Every day | ORAL | 5 refills | Status: DC
Start: 2018-02-07 — End: 2018-03-07

## 2018-02-11 ENCOUNTER — Ambulatory Visit: Payer: No Typology Code available for payment source | Admitting: Primary Care

## 2018-02-18 ENCOUNTER — Ambulatory Visit: Payer: No Typology Code available for payment source | Admitting: Primary Care

## 2018-03-04 ENCOUNTER — Encounter: Payer: Self-pay | Admitting: Primary Care

## 2018-03-04 ENCOUNTER — Ambulatory Visit: Payer: No Typology Code available for payment source | Attending: Primary Care | Admitting: Primary Care

## 2018-03-04 VITALS — BP 128/88 | HR 64 | Ht 69.0 in | Wt 216.4 lb

## 2018-03-04 DIAGNOSIS — F988 Other specified behavioral and emotional disorders with onset usually occurring in childhood and adolescence: Secondary | ICD-10-CM

## 2018-03-04 DIAGNOSIS — I10 Essential (primary) hypertension: Secondary | ICD-10-CM

## 2018-03-04 DIAGNOSIS — E785 Hyperlipidemia, unspecified: Secondary | ICD-10-CM

## 2018-03-04 DIAGNOSIS — F419 Anxiety disorder, unspecified: Secondary | ICD-10-CM

## 2018-03-04 NOTE — NoShare Progress Note (Signed)
Chief Complaint   Patient presents with    Hypertension    Hyperlipidemia    Depression    Other     ADD       Current Outpatient Prescriptions   Medication Sig Dispense Refill    amphetamine-dextroamphetamine (ADDERALL XR) 15 MG 24 hr capsule Take 1 capsule (15 mg total) by mouth every morning   Max daily dose: 15 mg May sprinkle contents of capsule on food, do not chew capsule. 30 capsule 0    escitalopram (LEXAPRO) 20 MG tablet Take 1 tablet (20 mg total) by mouth daily 30 tablet 5    rosuvastatin (CRESTOR) 10 MG tablet Take 1 tablet (10 mg total) by mouth daily 30 tablet 5    cholecalciferol (VITAMIN D) 2000 UNITS tablet Take 2,000 Units by mouth 2 times daily      clonazePAM (KLONOPIN) 0.5 MG tablet Take 0.5 mg by mouth 2 times daily   MDD 1 mg      Multiple Vitamin (MULTIVITAMIN) TABS Take 1 tablet by mouth daily       No current facility-administered medications for this visit.      Medications reviewed and no changes made    HYPERTENSION MANAGEMENT:  Chronic and stable    BP 128/88    Pulse 64    Ht 1.753 m (5\' 9" )    Wt 98.2 kg (216 lb 6.4 oz)    BMI 31.96 kg/m         PATIENT DENIES:  [x]  Chest Pain. [x]  Palpitations. [x]  Shortness of Breath.    SYMPTOMS: Rare headaches    HABITS:   --Sodium-   [] Yes  [x] No          --Exercise-  [x] Yes 3-5x week [employer] Bike   [] No          --Smoking-  [] Yes  [x] No          --Caffeine Use- [x] Yes 3 small travel mugs coffee/d [] No             HOME BLOOD PRESSURE:  Not taking    CHOLESTEROL MANAGEMENT: chronic and not at goal     DIET:       --Eggs:   [x] Yes  4/week [] No          --Fast Food:  [x] Yes 1-2 slices pizza a week, but less in the past few weeks-brings lunch to work.   [] No          --Junk Food:  [x] Yes   [] No           --Milk:   none        --Butter:   none       --Fiber:   [x] Yes 1 vege/1 apple a day  [] No        MEDICATIONS:  On Crestor    He has chronic depression which has improved on Lexapro and since taking a vacation. Better weather and exercise  also helps. He sees a Veterinary surgeoncounselor who advised he have 1 hour a day to himself to do what makes him happy. He keeps a journal which helps    ADD is controlled on Adderall.  Did not take it on vacation.  It helps him focus/be productive.     EXAM:    AFFECT:  Pleasant and cheerful in NAD  HEART: Regular rate and rhythm, no murmurs gallops or rubs.  LUNGS: Clear to auscultation bilaterally.     ASSESSMENT/PLAN:    1.  WJX:BJYNWGNFAHTN:Presently well-controlled and at goal  of less than 140/90.  Diet and exercise reviewed.  Currently on no medicine  2.  Hyperlipidemia: Patient's last profile was not at goal on Crestor.  Diet and exercise reviewed.  Check lipid profile/LFTs  3.  ADD: Stable on Adderall  4.  Depression: Improved since trying to develop more time to himself to exercise and do things he enjoys.  He also is hired more staff at work and and is better able to designate work load better.  Continue seeing his counselor.  Klonopin as needed.  Supportive counseling given.  Call if worse    RETURN:  5 months cholesterol/HTN and 12/14/18 CME

## 2018-03-07 ENCOUNTER — Other Ambulatory Visit: Payer: Self-pay | Admitting: Primary Care

## 2018-03-07 MED ORDER — ESCITALOPRAM OXALATE 20 MG PO TABS *I*
20.0000 mg | ORAL_TABLET | Freq: Every day | ORAL | 5 refills | Status: DC
Start: 2018-03-07 — End: 2018-03-24

## 2018-03-07 MED ORDER — ROSUVASTATIN CALCIUM 10 MG PO TABS *I*
10.0000 mg | ORAL_TABLET | Freq: Every day | ORAL | 5 refills | Status: DC
Start: 2018-03-07 — End: 2018-03-24

## 2018-03-07 MED ORDER — AMPHETAMINE-DEXTROAMPHETAMINE 15 MG PO CP24 *I*
15.0000 mg | ORAL_CAPSULE | Freq: Every morning | ORAL | 0 refills | Status: DC
Start: 2018-03-07 — End: 2018-04-19

## 2018-03-24 ENCOUNTER — Other Ambulatory Visit: Payer: Self-pay | Admitting: Primary Care

## 2018-03-24 MED ORDER — ROSUVASTATIN CALCIUM 10 MG PO TABS *I*
10.0000 mg | ORAL_TABLET | Freq: Every day | ORAL | 0 refills | Status: DC
Start: 2018-03-24 — End: 2018-04-19

## 2018-03-24 MED ORDER — ESCITALOPRAM OXALATE 20 MG PO TABS *I*
20.0000 mg | ORAL_TABLET | Freq: Every day | ORAL | 0 refills | Status: DC
Start: 2018-03-24 — End: 2018-04-19

## 2018-03-24 NOTE — Telephone Encounter (Signed)
Pt called and forgot meds on trip, asked for a four day supply

## 2018-03-27 ENCOUNTER — Ambulatory Visit
Admission: AD | Admit: 2018-03-27 | Discharge: 2018-03-27 | Disposition: A | Discharge status: Home / Self Care | Payer: No Typology Code available for payment source | Source: Ambulatory Visit | Attending: Emergency Medicine | Admitting: Emergency Medicine

## 2018-03-27 DIAGNOSIS — L509 Urticaria, unspecified: Secondary | ICD-10-CM

## 2018-03-27 MED ORDER — DIPHENHYDRAMINE HCL 50 MG ORAL SOLID *WRAPPED*
50.0000 mg | Freq: Three times a day (TID) | ORAL | 0 refills | Status: DC
Start: 2018-03-27 — End: 2018-09-30

## 2018-03-27 MED ORDER — FAMOTIDINE 20 MG PO TABS *I*
20.0000 mg | ORAL_TABLET | Freq: Two times a day (BID) | ORAL | 0 refills | Status: DC
Start: 2018-03-27 — End: 2018-09-30

## 2018-03-27 NOTE — UC Provider Note (Signed)
History     Chief Complaint   Patient presents with    Rash     Patient states exposure to scabbies. Rash on torso started today. Took permethrin cream as preventative. States it is itchy. Has not taken any OTC.     Pt put Permethrin cream on his body as a prophylaxis to scabies . 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    Vertigo     Steroid injection into right ear 12/17/2011        Patient Active Problem List   Diagnosis Code    Right Medial Collateral Ligament Tear 3/05     ADD (attention deficit disorder) F98.8    Right Medial Meniscus Tear 3/05     Allergic Rhinitis J30.9    Herniated Lumbar Disc M51.26    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    Right Hearing Loss H91.90    Essential hypertension I10    Right Tinnitus H93.19    Reactive airway disease J45.909    LBP (low back pain) M54.5    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    Pigmentary dispersion syndrome, 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    Migraine headache G43.909    Myopia with astigmatism, bilateral H52.13, H52.203    Overweight E66.3    Anxiety F41.9    Depression F32.9    Hyperlipidemia E78.5            Past Surgical History:   Procedure Laterality Date    LEFT MENISCECTOMY  01/2012    Medial & Lateral    LUMBAR FUSION      Lumbar Vertebral Fusion Conversion Data     SPINAL FUSION  1999    L5-S1    TYMPANOSTOMY TUBE PLACEMENT       Family History   Problem Relation Age of Onset    High cholesterol Father     Hypertension Father      Conversion Other         [id number]^[id number] Health Status^^Active^Father(A)HTN.Chol.Allergies Mother(A)Migraines 1 Brother(A)ADD 2 Sisters(A) 1 Daughter(A) PatGF(A)CAD.Glaucoma  DM2.Chol PatGM(D)49.MI MatGF(D)60s.Bone Ca MatGM(A)    Hearing loss Maternal Grandmother           Social History   Substance Use Topics    Smoking status: Former Smoker     Packs/day: 1.00     Years: 6.00     Types: Cigarettes     Quit date: 12/07/2005    Smokeless tobacco: Never Used      Comment: Quit 2007    Alcohol use 0.0 oz/week      Comment: Social use     Living Situation     Questions Responses    Patient lives with Family    Homeless No    Caregiver for other family member No    External Services None    Employment Employed    Comment:  Marine scientistfuneral director     Domestic Violence Risk No                Review of Systems   Review of Systems   Constitutional: Negative.    Musculoskeletal: Negative.    Skin: Positive for rash. Negative for pallor and wound.       Physical Exam   Triage Vitals  Triage Start: Start, (03/27/18 2059)   First Recorded BP: (!) 163/94, Resp: 18, Temp: 35.5 C (95.9 F), Temp src: TEMPORAL Oxygen Therapy SpO2: 97 %, Oximetry Source: Rt Hand, O2 Device: None (Room air), Heart Rate: 74, (03/27/18 2057) Heart Rate (via Pulse Ox): 74, (03/27/18 2057).      Physical Exam   Constitutional: He appears well-developed and well-nourished. No distress.   Skin: He is not diaphoretic.   A welt type rash to the abdominal wall , chest wall and back area         Medical Decision Making        Initial Evaluation:  ED First Provider Contact     Date/Time Event User Comments    03/27/18 2055 ED First Provider Contact Ignacia BayleyBAHRALILOM, Mariama Saintvil Initial Face to Face Provider Contact          Patient was seen on: 03/27/2018        Assessment:  42 y.o.male [facility] with HIVES         Plan: Benadryl 50 mg po tid   Pepcid 20 mg po bid   Avoid using Permethrin again   F/U with PCP  As needed          Final Diagnosis  Final  diagnoses:   [L50.9] Hives (Primary)         Richarda OsmondSALAH A Yazmyn Valbuena, MD              Addison NaegeliBahralilom, Ikaika Showers Alden, MD  03/27/18 2105

## 2018-04-19 ENCOUNTER — Other Ambulatory Visit: Payer: Self-pay | Admitting: Primary Care

## 2018-04-19 MED ORDER — AMPHETAMINE-DEXTROAMPHETAMINE 15 MG PO CP24 *I*
15.0000 mg | ORAL_CAPSULE | Freq: Every morning | ORAL | 0 refills | Status: DC
Start: 2018-04-19 — End: 2018-05-18

## 2018-04-19 MED ORDER — ROSUVASTATIN CALCIUM 10 MG PO TABS *I*
10.0000 mg | ORAL_TABLET | Freq: Every day | ORAL | 5 refills | Status: DC
Start: 2018-04-19 — End: 2018-05-18

## 2018-04-19 MED ORDER — ESCITALOPRAM OXALATE 20 MG PO TABS *I*
20.0000 mg | ORAL_TABLET | Freq: Every day | ORAL | 5 refills | Status: DC
Start: 2018-04-19 — End: 2018-05-18

## 2018-05-18 ENCOUNTER — Other Ambulatory Visit: Payer: Self-pay | Admitting: Primary Care

## 2018-05-18 MED ORDER — AMPHETAMINE-DEXTROAMPHETAMINE 15 MG PO CP24 *I*
15.0000 mg | ORAL_CAPSULE | Freq: Every morning | ORAL | 0 refills | Status: DC
Start: 2018-05-18 — End: 2018-06-20

## 2018-05-18 MED ORDER — ROSUVASTATIN CALCIUM 10 MG PO TABS *I*
10.0000 mg | ORAL_TABLET | Freq: Every day | ORAL | 5 refills | Status: DC
Start: 2018-05-18 — End: 2018-06-20

## 2018-05-18 MED ORDER — ESCITALOPRAM OXALATE 20 MG PO TABS *I*
20.0000 mg | ORAL_TABLET | Freq: Every day | ORAL | 5 refills | Status: DC
Start: 2018-05-18 — End: 2018-06-20

## 2018-05-24 ENCOUNTER — Other Ambulatory Visit
Admission: RE | Admit: 2018-05-24 | Discharge: 2018-05-24 | Disposition: A | Discharge status: Home / Self Care | Payer: No Typology Code available for payment source | Source: Ambulatory Visit | Attending: Internal Medicine | Admitting: Internal Medicine

## 2018-05-24 ENCOUNTER — Other Ambulatory Visit
Admission: RE | Admit: 2018-05-24 | Discharge: 2018-05-24 | Disposition: A | Discharge status: Home / Self Care | Payer: No Typology Code available for payment source | Source: Ambulatory Visit | Attending: Primary Care | Admitting: Primary Care

## 2018-05-24 DIAGNOSIS — E785 Hyperlipidemia, unspecified: Secondary | ICD-10-CM | POA: Insufficient documentation

## 2018-05-24 DIAGNOSIS — F321 Major depressive disorder, single episode, moderate: Secondary | ICD-10-CM | POA: Insufficient documentation

## 2018-05-24 LAB — CBC AND DIFFERENTIAL
Baso # K/uL: 0.1 10*3/uL (ref 0.0–0.1)
Basophil %: 0.8 %
Eos # K/uL: 0.2 10*3/uL (ref 0.0–0.5)
Eosinophil %: 3.1 %
Hematocrit: 46 % (ref 40–51)
Hemoglobin: 14.8 g/dL (ref 13.7–17.5)
IMM Granulocytes #: 0 10*3/uL
IMM Granulocytes: 0.5 %
Lymph # K/uL: 2.5 10*3/uL (ref 1.3–3.6)
Lymphocyte %: 41.2 %
MCH: 30 pg/cell (ref 26–32)
MCHC: 32 g/dL (ref 32–37)
MCV: 93 fL — ABNORMAL HIGH (ref 79–92)
Mono # K/uL: 0.8 10*3/uL (ref 0.3–0.8)
Monocyte %: 13.3 %
Neut # K/uL: 2.5 10*3/uL (ref 1.8–5.4)
Nucl RBC # K/uL: 0 10*3/uL (ref 0.0–0.0)
Nucl RBC %: 0 /100 WBC (ref 0.0–0.2)
Platelets: 232 10*3/uL (ref 150–330)
RBC: 4.9 MIL/uL (ref 4.6–6.1)
RDW: 12.2 % (ref 11.6–14.4)
Seg Neut %: 41.1 %
WBC: 6.2 10*3/uL (ref 4.2–9.1)

## 2018-05-24 LAB — MAGNESIUM: Magnesium: 2.1 mg/dL (ref 1.6–2.5)

## 2018-05-24 LAB — COMPREHENSIVE METABOLIC PANEL
ALT: 26 U/L (ref 0–50)
AST: 26 U/L (ref 0–50)
Albumin: 4.8 g/dL (ref 3.5–5.2)
Alk Phos: 68 U/L (ref 40–130)
Anion Gap: 13 (ref 7–16)
Bilirubin,Total: 0.3 mg/dL (ref 0.0–1.2)
CO2: 28 mmol/L (ref 20–28)
Calcium: 9.9 mg/dL (ref 9.0–10.3)
Chloride: 101 mmol/L (ref 96–108)
Creatinine: 0.94 mg/dL (ref 0.67–1.17)
GFR,Black: 116 *
GFR,Caucasian: 100 *
Glucose: 97 mg/dL (ref 60–99)
Lab: 18 mg/dL (ref 6–20)
Potassium: 4.7 mmol/L (ref 3.3–5.1)
Sodium: 142 mmol/L (ref 133–145)
Total Protein: 7.4 g/dL (ref 6.3–7.7)

## 2018-05-24 LAB — LIPID PANEL
Chol/HDL Ratio: 3.2
Cholesterol: 197 mg/dL
HDL: 62 mg/dL
LDL Calculated: 109 mg/dL
Non HDL Cholesterol: 135 mg/dL
Triglycerides: 129 mg/dL

## 2018-05-24 LAB — VITAMIN B12: Vitamin B12: 449 pg/mL (ref 232–1245)

## 2018-05-24 LAB — AST: AST: 24 U/L (ref 0–50)

## 2018-05-24 LAB — T3: T3: 99 ng/dL (ref 80–200)

## 2018-05-24 LAB — ALT: ALT: 26 U/L (ref 0–50)

## 2018-05-24 LAB — T4, FREE: Free T4: 1.2 ng/dL (ref 0.9–1.7)

## 2018-05-24 LAB — GGT: GGT: 47 U/L (ref 8–61)

## 2018-05-27 LAB — VITAMIN D
25-OH VIT D2: <4 ng/mL
25-OH VIT D3: 37 ng/mL
25-OH Vit Total: 37 ng/mL (ref 30–60)

## 2018-06-17 ENCOUNTER — Ambulatory Visit: Payer: No Typology Code available for payment source | Attending: Urology | Admitting: Urology

## 2018-06-17 ENCOUNTER — Encounter: Payer: Self-pay | Admitting: Urology

## 2018-06-17 VITALS — BP 138/90 | HR 77 | Ht 69.02 in | Wt 200.0 lb

## 2018-06-17 DIAGNOSIS — Z3009 Encounter for other general counseling and advice on contraception: Secondary | ICD-10-CM

## 2018-06-17 NOTE — Progress Notes (Signed)
Chief Complaint : Vasectomy Consultation     HPI : [name] is a 42 y.o. male who is being seen today for a discussion of vasectomy.  He is married and has 3 healthy children, ages 738, 685, and 9 months.  He and his wife are both interested in a method of permanent birth control.  He denies a family history of prostate cancer.  He denies a history of prior genital surgery.   Medications : has a current medication list which includes the following prescription(s): rosuvastatin, escitalopram, amphetamine-dextroamphetamine, diphenhydramine, cholecalciferol, clonazepam, multivitamin, and famotidine.  Allergies : No Known Allergies (drug, envir, food or latex)                                                                                           Review of Systems   Constitutional: Negative.    HENT: Negative.    Eyes: Negative.    Respiratory: Negative.    Cardiovascular: Negative.    Gastrointestinal: Negative.    Endocrine: Negative.    Genitourinary: Negative.    Musculoskeletal: Positive for back pain.   Skin: Negative.    Neurological: Positive for dizziness and headaches.   Hematological: Negative.    Psychiatric/Behavioral: Negative.        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  Family History : family history includes Cancer in his father and maternal grandfather; Conversion in an  other family member; Hearing loss in his maternal grandmother; High Blood Pressure in his father; High cholesterol in his father; Hypertension in his father.  Social History :  reports that he quit smoking about 12 years ago. His smoking use included Cigarettes. He has a 6.00 pack-year smoking history. He has never used smokeless tobacco. He reports that he drinks about 2.4 oz of alcohol per week . He reports that he uses drugs, including Marijuana.    Physical Exam   Constitutional: He is oriented to person, place, and time. He appears well-developed and well-nourished.   HENT:   Head: Normocephalic and atraumatic.   Right Ear: External ear normal.   Left Ear: External ear normal.   Nose: Nose normal.   Eyes: Pupils are equal, round, and reactive to light. Conjunctivae and EOM are normal.   Neck: Normal range of motion. Neck supple. No JVD present.   Cardiovascular: Normal rate.    Pulmonary/Chest: Effort normal. No respiratory distress. He has  no wheezes.   Abdominal: Soft. He exhibits no distension and no mass. There is no tenderness. There is no guarding.   Musculoskeletal: Normal range of motion.   Neurological: He is alert and oriented to person, place, and time.   Skin: Skin is warm and dry.   Psychiatric: He has a normal mood and affect. His behavior is normal. Judgment and thought content normal.   Vitals reviewed.    Penis - Normal, circumcised.  Urethral meatus normal.  Scrotum - normal, without lesions.     No intrascrotal masses appreciated.    No evidence of a spermatocele on either side    No evidence of a hydrocele on either side  Testes:   Right - normal in size, shape, consistency, orientation.  No masses evident.   Left - normal in size, shape, consistency, orientation.  No masses evident.  Vasa:   Right - present and normal   Left - present and normal   Epididymides:   Right - present and normal   Left - present and normal      Assessment:   Good Candidate For A Vasectomy    I discussed the  procedure with him, along with its risks, alternatives, and possible complications.  He knows to consider the procedure permanent.  He is aware of the fact that vasectomy reversal surgery (vasovasostomy) is not always successful in restoring sperm to the ejaculate, that such surgery is expensive ($8,500 - $12,500 in [place] currently), and that this surgery is very rarely covered by insurance.  He is aware of the fact that he is not immediately sterile after the procedure. Two semen specimens showing the absence of sperm in the ejaculate are required before he is to consider himself sterile.  We reviewed the relevant studies regarding prostate cancer as well as the known incidence of chronic orchalgia in 2 to 4 percent of men undergoing the procedure. We reviewed the standard vasectomy as well as the no-scalpel vasectomy.  He understands that the procedure I perform is essentially a modification of the classic no-scalpel technique.  After reviewing all of the above, he does wish to proceed.  He is certainly a very appropriate candidate for it.  Arrangements have been made for him to undergo the vasectomy in the near future at his convenience.    Plan:   Vasectomy    LOS:   09811   CY

## 2018-06-20 ENCOUNTER — Other Ambulatory Visit: Payer: Self-pay | Admitting: Primary Care

## 2018-06-20 MED ORDER — AMPHETAMINE-DEXTROAMPHETAMINE 15 MG PO CP24 *I*
15.0000 mg | ORAL_CAPSULE | Freq: Every morning | ORAL | 0 refills | Status: DC
Start: 2018-06-20 — End: 2019-08-23

## 2018-06-20 MED ORDER — ROSUVASTATIN CALCIUM 10 MG PO TABS *I*
10.0000 mg | ORAL_TABLET | Freq: Every day | ORAL | 5 refills | Status: DC
Start: 2018-06-20 — End: 2018-08-17

## 2018-06-20 MED ORDER — ESCITALOPRAM OXALATE 20 MG PO TABS *I*
20.0000 mg | ORAL_TABLET | Freq: Every day | ORAL | 5 refills | Status: DC
Start: 2018-06-20 — End: 2018-08-17

## 2018-06-23 ENCOUNTER — Telehealth: Payer: Self-pay | Admitting: Urology

## 2018-06-23 NOTE — Telephone Encounter (Signed)
Spoke to Mr. [name] and confirmed 07/01/18 vasectomy with Dr. Ron AgeeGentile.     Reminders    VASECTOMY - Arrive 15 minutes prior to appointment time.

## 2018-07-01 ENCOUNTER — Ambulatory Visit: Payer: No Typology Code available for payment source | Attending: Pathology | Admitting: Urology

## 2018-07-01 ENCOUNTER — Encounter: Payer: Self-pay | Admitting: Urology

## 2018-07-01 ENCOUNTER — Telehealth: Payer: Self-pay | Admitting: Urology

## 2018-07-01 VITALS — BP 132/75 | HR 67 | Ht 69.0 in | Wt 209.0 lb

## 2018-07-01 DIAGNOSIS — Z302 Encounter for sterilization: Secondary | ICD-10-CM | POA: Insufficient documentation

## 2018-07-01 HISTORY — PX: VASECTOMY: SHX75

## 2018-07-01 MED ORDER — HYDROCODONE-ACETAMINOPHEN 5-325 MG PO TABS *I*
1.0000 | ORAL_TABLET | ORAL | 0 refills | Status: DC | PRN
Start: 2018-07-01 — End: 2018-09-30

## 2018-07-01 NOTE — Procedures (Signed)
PRE-PROCEDURE TIME OUT    What procedure is being performed? Vasectomy  Correct procedure? yes  Correct Patient (use 2 identifiers)? yes  Correct Site? yes  Site Marked? N/A  Correct Side? N/A  Correct patient position? yes  Consent verified? yes  Tests results available?Yes  Imaging results available? N/A  List participants involved in time out. Dr. David Gentile, Christia Coaxum LPN  Availability of correct instruments and any special equipment or requirements? yes'

## 2018-07-01 NOTE — Telephone Encounter (Signed)
Patient stated they will not take the two together so that they will not interfere with one another.

## 2018-07-01 NOTE — Telephone Encounter (Signed)
Tammy [facility] called to report and interaction with clonazePAM (KLONOPIN) 0.5 MG tablet and HYDROcodone-acetaminophen (NORCO) 5-325 MG per tablet. She would like to make sure Dr.gentile is aware. Please call her at [phone].

## 2018-07-01 NOTE — Patient Instructions (Addendum)
Post Vasectomy Instruction Sheet    Following your vasectomy:  1. You should return directly home after the procedure, avoiding any unnecessary stops along the way.   2. When you arrive home, you should lie down with your legs elevated above the level of your heart. You should limit your physical activity for the remainder of the day.  3. You should apply an ice pack (a bag of frozen vegetables works well) to the scrotal area until bedtime. Be careful to avoid direct contact of the ice with the skin.   4. Start with Extra Strength Tylenol or Advil for pain control. If these are not sufficient, use the narcotic pain medication prescription given to you at your procedure.   5. You may remove your dressings after 24 hours. Apply Polysporin to the wound daily for 5 days.   6. You may shower after 24 hours. You should avoid tub baths, hot tubs, or swimming pools for at least 7 days after the surgery.   7. You may resume sexual activity after 5 days, if scrotal swelling is minimal. Remember, you are not to consider yourself sterile until this has been confirmed by two semen analysis.   8. You may return to work in 2 days, provided you are not engageing in heavy physical activity at work. If so, you should avoid this for at least 5 days.   9. It is normal to have some bloody drainage from the wound, some black and blue of the scrotal skin, and some scrotal swelling. Call the office if you have any concerns.   a. [address]  [phone]   b. [place] ([phone]  c. [place]  [phone]  d. [place] [phone]    Days to Weeks Later:   1. It is not uncommon to feel discomfort above and behind the testes for several weeks following a vasectomy. If you experience discomfort beyond 2-3 days following the procedure, begin Advil (ibuprofen) 600mg (3 tablets) three times a day daily for 10 days. If you have a history of ulcer disease or a sensitive stomach, please call the office  first.   Drop off two semen specimens at either of our two office locations Monday through Friday 9:00 a.m. to 4:00 p.m. Please call the office the day prior to dropping off a specimen to confirm that a physician will be available to check the specimen. The first specimen should be approximately 4 weeks after the procedure, and the second specimen should be approximately 6 weeks after the procedure. You will be notified of the results by phone. You do not need an appointment.

## 2018-07-03 ENCOUNTER — Encounter: Payer: Self-pay | Admitting: Urology

## 2018-07-03 NOTE — Progress Notes (Signed)
Pre-Operative Diagnosis:  Desire for permanent sterilization  Post-Operative Diagnosis:  Same  Procedure Performed:  Bilateral Partial Vasectomy  Anesthesia:       10 ccs  1% lidocaine without epinephrine     Procedure:    The patient was brought into the procedure room and placed on the operating table in the supine position.  His anterior scrotum was shaved.  His genitalia were prepped with betadine.  A fenestrated drape was then placed, isolating the scrotum.    The midline of the anterior scrotal wall was infiltrated with 1% lidocaine.  The swollen tissue was then squeezed to diffuse the anesthetic into surrounding tissues.  The left vas deferens was then isolated beneath the anterior scrotal wall using the classically described three finger method.  Lidocaine was injected directly into the vas above the level of anticipated transection, resulting in an anesthetic block of the vas deferens.  A ring clamp was then placed, anchoring the vas to the overlying scrotal skin.    A small stab incision was made, approximately 5 mms in length, directly over the vas.  Using sharp dissection, serial replacement of the ring clamp as dissection progressed, and limited electrocautery, the vas was isolated from surrounding structures.  Crushing clamps were placed across the vas, separated by a length of approximately 2 cms.  Suture ligation of each end of the vas was then performed proximal to each clamp using 3-0 chromic sutures.  The intervening segment of vas was then removed.  Electrocautery was then used to cauterize the lumen of each end of the vas.      A 3-0 chromic suture was then placed proximal to clamp on the testicular end of the vas, proximal to the previously placed suture ligature, and tied down securely. The clamp was then removed and the suture looped over the cut end of the vas segment and tied down securely, resulting in the cut end of the testicular end of the vas being directed away from the abdominal end  of the vas.  The abdominal end of the vas was then allowed to retract cephalad.  Fascial interposition was then performed, completely separating the transeceted vasal ends, by suturing the lateral aspects of the internal spermatic fascia to one another using a 3-0 chromic suture.   Hemostasis was confirmed, and the suture cut.     An identical procedure, including fascial interposition, was then carried out on the right side, with the vas stumps once again being allowed to retract away from one another into the scrotum.  No evidence of hemorrhage was observed.    The dartos muscle was closed with a figure of eight 3-0 chromic suture.  The skin was closed with a single vertical mattress 3-0 chromic suture.   The patient's underpants were then replaced, securing the gauze dressings in place.  He tolerated the procedure well.  Blood loss during the procedure was negligible (2-3 ccs).  Sponge, instrument, and needle counts were correct at the completion of the procedure.    Post-Operative Instructions:    The patient was advised to go straight home.  He was instructed to lay down, with his feet above his heart, for the majority of the day.  He was advised to deflect the penis cephalad and apply an ice pack or frozen vegetables over the anterior scrotum, above his under shorts and dressings, 20 minutes on and 20 minutes off, for the balance of the day.  He was instructed to use tylenol as needed for   mild pain and Hydrocodone for breakthrough pain.  A prescription for Hydrocodone was sent to his pharmacy.  He will avoid sexual activity and strenuous physical activity for one week.    He was told he was clear to shower in 24-48 hours but will avoid immersion of the would for one week.  He understands that he is not to consider himself sterile until two semen specimens confirm azoospermia a minimum of 6 weeks post-procedure.

## 2018-07-05 ENCOUNTER — Encounter: Payer: Self-pay | Admitting: Gastroenterology

## 2018-07-07 LAB — SURGICAL PATHOLOGY

## 2018-07-20 ENCOUNTER — Other Ambulatory Visit: Payer: Self-pay | Admitting: Urology

## 2018-07-20 ENCOUNTER — Other Ambulatory Visit: Payer: Self-pay | Admitting: Primary Care

## 2018-07-20 NOTE — Telephone Encounter (Signed)
Amphetamine-dextro was filled early August - prescribed by Christell Faitheborah Healy

## 2018-08-05 ENCOUNTER — Ambulatory Visit: Payer: No Typology Code available for payment source | Admitting: Primary Care

## 2018-08-17 ENCOUNTER — Other Ambulatory Visit: Payer: Self-pay | Admitting: Primary Care

## 2018-08-17 MED ORDER — ESCITALOPRAM OXALATE 20 MG PO TABS *I*
20.0000 mg | ORAL_TABLET | Freq: Every day | ORAL | 0 refills | Status: DC
Start: 2018-08-17 — End: 2019-08-23

## 2018-08-17 MED ORDER — ROSUVASTATIN CALCIUM 10 MG PO TABS *I*
10.0000 mg | ORAL_TABLET | Freq: Every day | ORAL | 0 refills | Status: DC
Start: 2018-08-17 — End: 2018-09-30

## 2018-08-22 ENCOUNTER — Telehealth: Payer: Self-pay | Admitting: Urology

## 2018-08-22 NOTE — Telephone Encounter (Signed)
Mr. [name] calling to report that he is experiencing pain left testicle aches all the time and tender to touch. These symptoms have been going on for 2 weeks after his procedure back in July.    Pain Rating 6/10    Patient requesting same day appointment? yes  Patient requesting call back? yes    Phone number confirmed at [phone]

## 2018-08-22 NOTE — Telephone Encounter (Signed)
Returned call to patient.  Pt states post vasectomy and still have mild amount of pain in left testicle.  Pt states has a 'lump' there and also notices the pain worse with bike riding.  Pt states played 36 holes of golf this past Sunday and noticed aching on Sunday following golf and on Monday.  Pt denies worsening swelling from prior to procedure.  Pt denies fever, chills, no prolonged ecchymosis.    Pt has not used ice since initial recovery.  Taking Advil at times, no response to pain.  Suggested icing for comfort after biking and golf.  No issues with urination or sexual activity.    Pt notified writer will share information with Dr. Ron AgeeGentile and return call if anything further to notify patient about.  Pt also adds did not give sample yet as lost his cup.  [facility].

## 2018-08-22 NOTE — Telephone Encounter (Signed)
He needs to take ibuprofen 600 mgs TID with food for 10 days, not just when he is uncomfortable.  He should call us after that.  [name]

## 2018-08-23 NOTE — Telephone Encounter (Signed)
Returned call to patient.  Notified of Dr. Boone MasterGentile's order for patient to take 600mg  Ibuprofen three times a day for 10 days only with food and not just when he is uncomfortable.  Please call us after that.  Pt also states will be stopping by office today to get cup to give sample.

## 2018-08-26 ENCOUNTER — Ambulatory Visit: Payer: No Typology Code available for payment source

## 2018-08-26 DIAGNOSIS — Z3141 Encounter for fertility testing: Secondary | ICD-10-CM

## 2018-08-26 NOTE — Progress Notes (Signed)
Left message ok per HIPAA advising semen sample #1 was negative and he needs to drop off another sample in 2 weeks or 10 ejaculations and to continue using birth control.    Sample #1 read by Dr.Gentile

## 2018-09-14 ENCOUNTER — Other Ambulatory Visit: Payer: Self-pay | Admitting: Primary Care

## 2018-09-23 ENCOUNTER — Ambulatory Visit
Admission: AD | Admit: 2018-09-23 | Discharge: 2018-09-23 | Disposition: A | Discharge status: Home / Self Care | Payer: No Typology Code available for payment source | Source: Ambulatory Visit | Attending: Emergency Medicine | Admitting: Emergency Medicine

## 2018-09-23 DIAGNOSIS — S0501XA Injury of conjunctiva and corneal abrasion without foreign body, right eye, initial encounter: Secondary | ICD-10-CM

## 2018-09-23 MED ORDER — POLYMYXIN B-TRIMETHOPRIM 10000-0.1 UNIT/ML-% OP SOLN *I*
1.0000 [drp] | Freq: Three times a day (TID) | OPHTHALMIC | 0 refills | Status: AC
Start: 2018-09-23 — End: 2018-09-28

## 2018-09-23 NOTE — [facility] Provider Note (Signed)
History     Chief Complaint   Patient presents with    Foreign Body in Eye       History provided by:  Patient  Language interpreter used: No    Eye Pain   Location:  Right eye  Quality:  Stabbing, stinging and tearing  Severity:  Moderate  Onset quality:  Sudden  Duration:  3 hours  Timing:  Constant  Progression:  Unchanged  Chronicity:  New  Context comment:  UNK  Relieved by:  Nothing  Worsened by:  Contact, eye movement and bright light  Ineffective treatments:  Flushing and eye drops  Associated symptoms: redness and tearing    Associated symptoms: no blurred vision, no crusting, no decreased vision, no discharge, no double vision, no facial rash, no headaches, no inflammation, no itching, no nausea, no numbness, no photophobia, no scotomas, no swelling, no tingling, no vomiting and no weakness        Medical/Surgical/Family History     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    Vertigo     Steroid injection into right ear 12/17/2011        Patient Active Problem List   Diagnosis Code    Right Medial Collateral Ligament Tear 3/05     ADD (attention deficit disorder) F98.8    Right Medial Meniscus Tear 3/05     Allergic Rhinitis J30.9    Herniated Lumbar Disc M51.26    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    Right Hearing Loss H91.90    Essential hypertension I10    Right Tinnitus H93.19    Reactive airway disease J45.909    LBP (low back pain) M54.5    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    Pigmentary dispersion syndrome, 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    Migraine headache G43.909    Myopia with astigmatism, bilateral H52.13, H52.203     Overweight E66.3    Anxiety F41.9    Depression F32.9    Hyperlipidemia E78.5            Past Surgical History:   Procedure Laterality Date    LEFT MENISCECTOMY  01/2012    Medial & Lateral    LUMBAR FUSION      Lumbar Vertebral Fusion Conversion Data     SPINAL FUSION  1999    L5-S1    TYMPANOSTOMY TUBE PLACEMENT      VASECTOMY  07/01/2018     Family History   Problem Relation Age of Onset    High cholesterol Father     Hypertension Father     Cancer Father     High Blood Pressure Father     Conversion Other         [id number] Health Status^^Active^Father(A)HTN.Chol.Allergies Mother(A)Migraines 1 Brother(A)ADD 2 Sisters(A) 1 Daughter(A) PatGF(A)CAD.Glaucoma  DM2.Chol PatGM(D)49.MI MatGF(D)60s.Bone Ca MatGM(A)    Hearing loss Maternal Grandmother     Cancer Maternal Grandfather           Social History     Tobacco Use    Smoking status: Former Smoker  Packs/day: 1.00     Years: 6.00     Pack years: 6.00     Types: Cigarettes     Last attempt to quit: 12/07/2005     Years since quitting: 12.8    Smokeless tobacco: Never Used    Tobacco comment: Quit 2007   Substance Use Topics    Alcohol use: Yes     Alcohol/week: 4.0 standard drinks     Types: 4 Standard drinks or equivalent per week    Drug use: Yes     Types: Marijuana     Living Situation     Questions Responses    Patient lives with Family    Homeless No    Caregiver for other family member No    External Services None    Employment Employed    Comment: Marine scientist     Domestic Violence Risk No                Review of Systems   Review of Systems   Constitutional: Negative for activity change, appetite change, chills and fever.   HENT: Negative for congestion, ear pain and sneezing.    Eyes: Positive for pain and redness. Negative for blurred vision, double vision, photophobia, discharge, itching and visual disturbance.   Respiratory: Negative for cough and shortness of breath.    Cardiovascular: Negative for chest pain and  palpitations.   Gastrointestinal: Negative for abdominal pain, nausea and vomiting.   Genitourinary: Negative for dysuria and flank pain.   Musculoskeletal: Negative for back pain and myalgias.   Neurological: Negative for tingling, facial asymmetry, speech difficulty, weakness, numbness and headaches.   Psychiatric/Behavioral: Negative for behavioral problems. The patient is not nervous/anxious.        Physical Exam   Triage Vitals  Triage Start: Start, (09/23/18 1616)   First Recorded BP: (!) 155/97, Temp: 36 C (96.8 F), Temp src: TEMPORAL Oxygen Therapy SpO2: 98 %, Oximetry Source: Rt Hand, O2 Device: None (Room air), Heart Rate: 78, (09/23/18 1619)  .      Physical Exam  Vitals signs and nursing note reviewed.   Constitutional:       General: He is not in acute distress.     Appearance: Normal appearance. He is well-developed.   HENT:      Head: Normocephalic and atraumatic.   Eyes:      General: Lids are normal. Lids are everted, no foreign bodies appreciated. Vision grossly intact. Gaze aligned appropriately.         Right eye: No foreign body, discharge or hordeolum.      Extraocular Movements:      Right eye: Normal extraocular motion and no nystagmus.      Conjunctiva/sclera:      Right eye: Right conjunctiva is not injected. No chemosis, exudate or hemorrhage.     Pupils: Pupils are equal, round, and reactive to light.      Right eye: Pupil is round, reactive and not sluggish. Corneal abrasion present. No fluorescein uptake.   Neck:      Musculoskeletal: Normal range of motion.   Cardiovascular:      Rate and Rhythm: Normal rate.      Heart sounds: Normal heart sounds.   Pulmonary:      Effort: Pulmonary effort is normal. No respiratory distress.      Breath sounds: Normal breath sounds. No wheezing.   Abdominal:      Tenderness: There is no tenderness.   Musculoskeletal: Normal range  of motion.         General: No tenderness or deformity.   Skin:     General: Skin is warm and dry.   Neurological:       Mental Status: He is alert and oriented to person, place, and time.   Psychiatric:         Speech: Speech normal.         Behavior: Behavior normal.          Medical Decision Making      Amount and/or Complexity of Data Reviewed  Review and summarize past medical records: yes        Initial Evaluation:  ED First Provider Contact     Date/Time Event User Comments    09/23/18 1611 ED First Provider Contact Linden Dolin, Chaz Ronning D Initial Face to Face Provider Contact          Patient was seen on: 09/23/2018    Assessment:  42 y.o.male [facility] with eye pain and redness    Differential Diagnosis includes:  Corneal abrasion, corneal ulcer, foreign body, iritis    Plan:   Exam/Test(s): Physical exam consistent with corneal abrasion  Treatment: polytrim  Disposition: discharge  Orders Placed This Encounter    trimethoprim-polymyxin b (POLYTRIM) ophthalmic solution     No results found for this or any previous visit (from the past 24 hour(s)).    Final Diagnosis    ICD-10-CM ICD-9-CM   1. Abrasion of right cornea, initial encounter S05.01XA 918.1     Pt was instructed to:  Encourage fluids, encourage rest, good hand hygiene.  Use over the counter medications as discussed.  Please start the new medications as below:  Current Discharge Medication List      New Medications    Details Last Dose Given Next Dose Due Script Given?   trimethoprim-polymyxin b (POLYTRIM) 1 drop Dose: 1 drop  Place 1 drop into both eyes 3 times daily  Quantity 10 mL, Refill 0  Start date: 09/23/2018, End date: 09/28/2018       Comments: Emergency Encounter                 Please follow up with physician as below:  Follow-up Information     Call  Cheri Fowler, MD.    Specialties:  Primary Care, Internal Medicine  Why:  As needed, If symptoms worsen  Contact information:  [address]  [address]  [place] [address]  [phone]             Pt comfortable with plan for discharge. All questions and concerns were  addressed.  This note was transcribed using voice recognition software.  Please excuse irregularities that may result.  \        Final Diagnosis  Final diagnoses:   [S05.01XA] Abrasion of right cornea, initial encounter (Primary)         Hermelinda Medicus, NP              Hermelinda Medicus, NP  09/23/18 [phone]

## 2018-09-23 NOTE — ED Notes (Signed)
09/23/18 1622   Visual Acuity   Bilateral Distance 20/25   R Distance 20/40   L Near 20/25

## 2018-09-23 NOTE — Discharge Instructions (Signed)
Apply antibiotic eye drops to your effected eye as directed.     Wear sunglasses as much as possible to protect your eyes from light sensitivity.    Please also wear safety glasses with any activity that could lead to another eye injury.    For pain/inflammation relief, please continue to take 400-600 mg of ibuprofen every 6-8 hours, accompanied with ice application to affected area.  You can dab your eye gently with a tissue to remove the discharge but do not rub your eye.  You may also use cool compresses on the affected area, such as a cool wet washcloth, 10 minutes at a time, 4 times daily for symptom relief.    If your symptoms acutely worsen, with new vision change, eye pain, eye swelling, inability to open eye, light sensitivity, headache, dizziness, lightheadedness, loss of consciousness, you need to be evaluated immediately in your nearest ophthalmologist, whose contact information has been provided in your discharge paperwork, or your nearest emergency department for further workup and treatment.

## 2018-09-23 NOTE — ED Triage Notes (Signed)
Pt c/o FB sensation to OD today, irrigated eye s much relief, denies any drainage or visual changes       Triage Note   Nicolemarie Wooley, RN

## 2018-09-26 ENCOUNTER — Telehealth: Payer: Self-pay | Admitting: Urology

## 2018-09-26 NOTE — Telephone Encounter (Signed)
The patient would like to know if he can come to the office to pick up a semen sample jar.    Please return his call at [phone]

## 2018-09-27 NOTE — Telephone Encounter (Signed)
Spoke to patient, advised he can pick up the cups today or tomorrow between 8:30 AM and 5:30 PM. Patient appreciative of the call back.

## 2018-09-28 ENCOUNTER — Encounter: Payer: Self-pay | Admitting: Primary Care

## 2018-09-28 ENCOUNTER — Other Ambulatory Visit: Payer: Self-pay | Admitting: Primary Care

## 2018-09-30 ENCOUNTER — Ambulatory Visit: Payer: No Typology Code available for payment source

## 2018-09-30 ENCOUNTER — Other Ambulatory Visit: Payer: Self-pay | Admitting: Primary Care

## 2018-09-30 ENCOUNTER — Ambulatory Visit: Payer: No Typology Code available for payment source | Attending: Primary Care | Admitting: Primary Care

## 2018-09-30 ENCOUNTER — Encounter: Payer: Self-pay | Admitting: Primary Care

## 2018-09-30 VITALS — BP 132/84 | HR 76 | Ht 69.0 in | Wt 216.8 lb

## 2018-09-30 DIAGNOSIS — Z23 Encounter for immunization: Secondary | ICD-10-CM

## 2018-09-30 DIAGNOSIS — F32A Depression, unspecified: Secondary | ICD-10-CM

## 2018-09-30 DIAGNOSIS — F329 Major depressive disorder, single episode, unspecified: Secondary | ICD-10-CM

## 2018-09-30 DIAGNOSIS — F988 Other specified behavioral and emotional disorders with onset usually occurring in childhood and adolescence: Secondary | ICD-10-CM

## 2018-09-30 DIAGNOSIS — E785 Hyperlipidemia, unspecified: Secondary | ICD-10-CM

## 2018-09-30 DIAGNOSIS — I10 Essential (primary) hypertension: Secondary | ICD-10-CM

## 2018-09-30 MED ORDER — ROSUVASTATIN CALCIUM 10 MG PO TABS *I*
10.0000 mg | ORAL_TABLET | Freq: Every day | ORAL | 1 refills | Status: DC
Start: 2018-09-30 — End: 2019-02-24

## 2018-09-30 NOTE — No[facility] Progress Note (Signed)
Chief Complaint   Patient presents with    Hypertension    Hyperlipidemia    ADD    Depression     Current Outpatient Medications   Medication Sig Dispense Refill    clonazePAM (KLONOPIN) 0.25 MG disintegrating tablet Take 0.25 mg by mouth daily Dr. Shea Evans Prescribes      rosuvastatin (CRESTOR) 10 MG tablet Take 1 tablet (10 mg total) by mouth daily   NEEDS OFFICE VISIT 30 tablet 0    escitalopram (LEXAPRO) 20 MG tablet Take 1 tablet (20 mg total) by mouth daily 30 tablet 0    amphetamine-dextroamphetamine (ADDERALL XR) 15 MG 24 hr capsule Take 1 capsule (15 mg total) by mouth every morning   Max daily dose: 15 mg May sprinkle contents of capsule on food, do not chew capsule. 30 capsule 0    cholecalciferol (VITAMIN D) 2000 UNITS tablet Take 2,000 Units by mouth 2 times daily      Multiple Vitamin (MULTIVITAMIN) TABS Take 1 tablet by mouth daily       No current facility-administered medications for this visit.      Medications reviewed and no changes made    HYPERTENSION MANAGEMENT:     BP 132/84    Pulse 76    Ht 1.753 m (5\' 9" )    Wt 98.3 kg (216 lb 12.8 oz)    BMI 32.02 kg/m         PATIENT DENIES: [x]  Headaches. [x]  Chest Pain. [x]  Palpitations. [x]  Shortness of Breath.    HABITS:   --Sodium-   [] Yes  [x] No          --Exercise-  [x] Yes has a [place] Bike but changed to jogging and hurt low back for a few days  [] No          --Smoking-  [] Yes  [] No          --Caffeine Use- [x] Yes  3 medium coffee/d  [] No             HOME BLOOD PRESSURE: normal    CHOLESTEROL MANAGEMENT:  Chronic and stable     DIET:       --Eggs:   [x] Yes 4/week  [] No          --Fast Food:  [x] Yes pizza 1-2 slices a week  [] No          --Junk Food:  [x] Yes rare  [] No           --Milk:   none        --Butter:   none       --Fiber:   [x] Yes has a fruit smoothie daily, has a vege if eating dinner at home, occasional apples  [] No        MEDICATIONS:  On Crestor    He has chronic ADD/Depression and started to see Dr Amadeo Garnet from  Psychiatry ~6 months ago. She is managing Lexapro/Adderall which are helping his mood.  No palpitations. Rare insomnia from work stress. Uses Klonopin 0.25mg  a day to help his headaches. 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     EXAM:    AFFECT:  Pleasant and cheerful in NAD  HEART: Regular rate and rhythm, no murmurs gallops or rubs.  LUNGS: Clear to auscultation bilaterally.    ASSESSMENT/PLAN:    1. HTN:   Presently well-controlled and at goal of less than 140/90.  Diet and exercise reviewed. On no meds  2. Hyperlipidemia: on Crestor and at goal. Diet/exercise reviewed. Check FLP/LFTs  3. ADD/Depression: stable on Adderall/Lexapro/Klonopin. He is followed by Dr Delbert Phenix  4.  Health maintenance: Flu shot given.     RETURN:  12/14/18 CME

## 2018-09-30 NOTE — Progress Notes (Unsigned)
Called patient to report second semen sample post vasectomy was negative as read by Dr. Ron Agee today.    FIrst sample was negative as well.    Patient ok to stop contraception.     Patient not home. OK to leave amessage per form he filled out in office when dropping off sample.    [name]

## 2018-10-06 ENCOUNTER — Encounter: Payer: Self-pay | Admitting: Primary Care

## 2018-12-13 ENCOUNTER — Encounter: Payer: Self-pay | Admitting: Primary Care

## 2018-12-14 ENCOUNTER — Encounter: Payer: No Typology Code available for payment source | Admitting: Primary Care

## 2018-12-28 ENCOUNTER — Other Ambulatory Visit
Admission: RE | Admit: 2018-12-28 | Discharge: 2018-12-28 | Disposition: A | Discharge status: Home / Self Care | Payer: No Typology Code available for payment source | Source: Ambulatory Visit | Attending: Primary Care | Admitting: Primary Care

## 2018-12-28 DIAGNOSIS — E785 Hyperlipidemia, unspecified: Secondary | ICD-10-CM

## 2018-12-28 LAB — LIPID PANEL
Chol/HDL Ratio: 3.6
Cholesterol: 187 mg/dL
HDL: 52 mg/dL (ref 40–60)
LDL Calculated: 105 mg/dL
Non HDL Cholesterol: 135 mg/dL
Triglycerides: 150 mg/dL — AB

## 2018-12-28 LAB — AST: AST: 26 U/L (ref 0–50)

## 2018-12-28 LAB — ALT: ALT: 30 U/L (ref 0–50)

## 2018-12-29 ENCOUNTER — Ambulatory Visit: Payer: No Typology Code available for payment source | Attending: Primary Care | Admitting: Primary Care

## 2018-12-29 ENCOUNTER — Encounter: Payer: Self-pay | Admitting: Primary Care

## 2018-12-29 VITALS — BP 112/78 | HR 68 | Ht 69.5 in | Wt 215.2 lb

## 2018-12-29 DIAGNOSIS — I10 Essential (primary) hypertension: Secondary | ICD-10-CM

## 2018-12-29 DIAGNOSIS — F419 Anxiety disorder, unspecified: Secondary | ICD-10-CM

## 2018-12-29 DIAGNOSIS — E785 Hyperlipidemia, unspecified: Secondary | ICD-10-CM

## 2018-12-29 DIAGNOSIS — Z0000 Encounter for general adult medical examination without abnormal findings: Secondary | ICD-10-CM

## 2018-12-29 DIAGNOSIS — J45909 Unspecified asthma, uncomplicated: Secondary | ICD-10-CM

## 2018-12-29 DIAGNOSIS — E663 Overweight: Secondary | ICD-10-CM

## 2018-12-29 LAB — TESTOSTERONE, FREE, TOTAL
Testosterone,% Free: 2 %
Testosterone,Free: 116 pg/mL (ref 47–244)
Testosterone: 573 ng/dL (ref 249–836)

## 2018-12-29 LAB — SEX HORMONE BINDING GLOBULIN: Sex Hormone Binding Glob: 33 nmol/L (ref 10–80)

## 2018-12-29 LAB — TSH: TSH: 2.05 u[IU]/mL (ref 0.27–4.20)

## 2018-12-29 NOTE — H&P (Signed)
History and Physical    HISTORY:  Chief Complaint   Patient presents with    Annual Exam         History of Present Illness:    His left lateral proximal forefoot has been sore ~10 day. Was doing more walking/jogging (98,000 steps a week). Rolled the ankle 1 week ago playing paddle. Had mild swelling and used ice/sporadic Advil. He stopped jogging x 2 days and is only doing Peloton bike which also can bother it.       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    Migraine headache G43.909    Myopia with astigmatism, bilateral H52.13, H52.203    Overweight E66.3    Anxiety F41.9    Depression F32.9    Hyperlipidemia E78.5        Past Medical/Surgical History:   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    Vertigo     Steroid injection into right ear 12/17/2011     Past Surgical History:   Procedure Laterality Date    LEFT MENISCECTOMY  01/2012    Medial & Lateral    LUMBAR FUSION      Lumbar Vertebral Fusion Conversion Data     SPINAL FUSION  1999    L5-S1    TYMPANOSTOMY TUBE PLACEMENT      VASECTOMY  07/01/2018         Allergies:  No Known Allergies (drug,  envir, food or latex)    Current medications:    Current Outpatient Medications   Medication Sig    clonazePAM (KLONOPIN) 0.25 MG disintegrating tablet Take 0.25 mg by mouth daily Dr. Shea Evansunn Prescribes    rosuvastatin (CRESTOR) 10 MG tablet Take 1 tablet (10 mg total) by mouth daily    escitalopram (LEXAPRO) 20 MG tablet Take 1 tablet (20 mg total) by mouth daily    amphetamine-dextroamphetamine (ADDERALL XR) 15 MG 24 hr capsule Take 1 capsule (15 mg total) by mouth every morning   Max daily dose: 15 mg May sprinkle contents of capsule on food, do not chew capsule.    cholecalciferol (VITAMIN D) 2000 UNITS tablet Take 2,000 Units by mouth 2 times daily    Multiple Vitamin (MULTIVITAMIN) TABS Take 1 tablet by mouth daily       Family History:    Family History   Problem Relation Age of Onset    High cholesterol Father     Hypertension Father  Cancer Father     High Blood Pressure Father     Conversion Other         [id number] Health Status^^Active^Father(A)HTN.Chol.Allergies Mother(A)Migraines 1 Brother(A)ADD 2 Sisters(A) 1 Daughter(A) PatGF(A)CAD.Glaucoma  DM2.Chol PatGM(D)49.MI MatGF(D)60s.Bone Ca MatGM(A)    Hearing loss Maternal Grandmother     Cancer Maternal Grandfather        Social/Occupational History:   Social History     Socioeconomic History    Marital status: Married     Spouse name: Not on file    Number of children: 2    Years of education: Not on file    Highest education level: Not on file   Tobacco Use    Smoking status: Former Smoker     Packs/day: 1.00     Years: 6.00     Pack years: 6.00     Types: Cigarettes     Last attempt to quit: 12/07/2005     Years since quitting: 13.0    Smokeless tobacco: Never Used    Tobacco comment: Quit 2007   Substance and Sexual Activity    Alcohol use: Yes     Alcohol/week: 4.0 standard drinks     Types: 4 Standard drinks or equivalent per week     Comment: social    Drug use: Yes     Types: Marijuana    Sexual activity: Yes      Partners: Female     Birth control/protection: Condom   Other Topics Concern    Not on file   Social History Narrative    Not on file         Review of Systems:    Review of Systems   Constitutional: Positive for malaise/fatigue ( works 65 hours a week. Has a 42 year old as well). Negative for chills, fever and weight loss.        Normal appetite.  No night sweats.   HENT: Positive for hearing loss (right-stable) and tinnitus (occasional right). Negative for congestion, ear pain, nosebleeds and sore throat.         No rhinitis   Eyes: Negative for blurred vision, double vision, pain, discharge and redness.        Wears glasses   Respiratory: Negative for cough, hemoptysis, shortness of breath and wheezing.    Cardiovascular: Positive for leg swelling (see HPI). Negative for chest pain, palpitations and orthopnea.   Gastrointestinal: Negative for abdominal pain, blood in stool, constipation, diarrhea, heartburn, melena, nausea and vomiting.        No Dysphagia   Genitourinary: Negative for dysuria, flank pain, frequency, hematuria and urgency.        Rare Nocturia.  No penile sores/discharge.  No testicle pain/masses.  No hernias.  No erectile dysfunction.   Musculoskeletal: Positive for back pain (chronic lumbar) and joint pain (see HPI). Negative for falls, myalgias and neck pain.   Skin: Positive for itching ( left anterior ankle -will use lotion) and rash (left anterior ankle which is improving).        No mole changes   Neurological: Negative for dizziness, tingling, sensory change, focal weakness, loss of consciousness, weakness and headaches.   Endo/Heme/Allergies: Negative for polydipsia. Does not bruise/bleed easily.        No adenopathy.   Psychiatric/Behavioral: Positive for depression (sees a Veterinary surgeon and a Therapist, sports). Negative for memory loss and suicidal ideas. The patient is nervous/anxious and has insomnia (5x week has trouble staying asleep-was drinking 2 beers at night/exercising at night  which  he stopped 3 weeks ago.  ).    NUTRITION:     -Caffeine: 2 medium coffee/d   -Salt: none   -Cholesterol: no fast food, slight increase in sweets over the holidays but rare now, drinks no milk, eats 4 eggs/week    SAFETY:     -Seatbelt: yes   -Safe Sex: no affairs    SELF TESTICLE EXAM (MALE): occasional    EXERCISE: see HPI    PROXY:  Has one       Vital Signs:   BP 112/78    Pulse 68    Ht 1.765 m (5' 9.5")    Wt 97.6 kg (215 lb 3.2 oz)    BMI 31.32 kg/m     EKG:  NSR 61. Old inverted T III. No acute change from 05/27/15    PFT's: mild small airway obstruction    PHYSICAL EXAM:  Physical Exam   Constitutional: He is oriented to person, place, and time. He appears well-developed and well-nourished. No distress.   HENT:   Head: Normocephalic.   Right Ear: Tympanic membrane, external ear and ear canal normal. Decreased hearing is noted.   Left Ear: Hearing, tympanic membrane, external ear and ear canal normal.   Nose: Nose normal.   Mouth/Throat: Uvula is midline, oropharynx is clear and moist and mucous membranes are normal. No oral lesions. Normal dentition. No oropharyngeal exudate.   Eyes: Pupils are equal, round, and reactive to light. Conjunctivae, EOM and lids are normal. Right eye exhibits no discharge. Left eye exhibits no discharge. No scleral icterus.   Neck: Normal range of motion. Neck supple. Carotid bruit is not present. No thyroid mass and no thyromegaly present.   Cardiovascular: Normal rate, regular rhythm, normal heart sounds and intact distal pulses. Exam reveals no gallop and no friction rub.   No murmur heard.  Pulmonary/Chest: Effort normal and breath sounds normal. No respiratory distress. He has no wheezes. He has no rales.   Abdominal: Soft. Bowel sounds are normal. He exhibits no distension, no abdominal bruit, no pulsatile midline mass and no mass. There is no splenomegaly or hepatomegaly. There is no abdominal tenderness. There is no rebound and no guarding. No hernia. Hernia confirmed  negative in the right inguinal area and confirmed negative in the left inguinal area.   Genitourinary:    Prostate, testes and penis normal.   Rectum:      Guaiac result negative.      No external hemorrhoid or abnormal anal tone.   Prostate is not enlarged.   Musculoskeletal: Normal range of motion.         General: No tenderness or edema.   Neurological: He is alert and oriented to person, place, and time. He displays normal reflexes. No cranial nerve deficit. He exhibits normal muscle tone. He displays a negative Romberg sign. Coordination and gait normal.   Skin: Skin is warm and dry. No rash noted. No cyanosis. Nails show no clubbing.        No mole changes   Psychiatric: He has a normal mood and affect.           Assessment:    [name] was seen today for annual exam.    Diagnoses and all orders for this visit:    Encounter for annual physical exam  -     EKG 12 lead  -     POCT Occult Blood Stool  -     Spirometry (In Office); Future  -  Comprehensive metabolic panel; Future  -     Lipid Panel (Reflex to Direct  LDL if Triglycerides more than 400); Future  -     CBC and differential; Future  -     Urinalysis with reflex to microscopic; Future    Hypertension  -     EKG 12 lead  -     Comprehensive metabolic panel; Future  -     Urinalysis with reflex to microscopic; Future    Hyperlipidemia, unspecified hyperlipidemia type  -     EKG 12 lead  -     Lipid Panel (Reflex to Direct  LDL if Triglycerides more than 400); Future    Anxiety    Reactive airway disease  -     Spirometry (In Office); Future       .      Plan:           Assessment/Plan:    1.  ZOX:[id number]:Presently well-controlled and at goal of less than 140/90.  Diet and exercise reviewed.  Currently on no medicine  2.  Hyperlipidemia: Recent profile was reviewed which was much improved and at goal.  Continue Crestor.  Diet and exercise reviewed  3.  Anxiety/depression: Secondary to trying to balance home and work life.  He recently implemented a new computer  system at work which is very stressful but hopefully in a few months will actually help decrease his stress.  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  He is thinking about taking Thursdays off to spend more time with his wife and children.  He is trying to get back into exercise as well.  He currently sees a psychiatrist (Dr. Barbie Bannerebra Healy) as well as a Veterinary surgeoncounselor.  Supportive counseling today was given.  Continue Lexapro/Klonopin.  He was unsuccessful in weaning his Klonopin recently.  Minimize caffeine especially if feeling anxious.  Check labs below to rule out other causes of fatigue.  He does have insomnia that is slowly starting to improve since quitting drinking alcohol.  4.  ADD: Stable on Adderall.  5.  Reactive airway disease: PFTs did show some mild small airway obstruction.  He is currently asymptomatic.  He has had a flu shot already.  6.  Allergic rhinitis: Currently stable.  He does have a history of allergy shots.  7.  History of Mnire's disease right ear: He has chronic mild hearing loss and tinnitus which is stable.  Continue using hearing protection around loud noise  8.  Migraine headaches: Fortunately asymptomatic.  He will start keeping a diary if and when they recur to try to identify specific triggers  9.  Overweight: Diet and exercise extensively reviewed.  Healthy food choices and portion control reviewed.  Patient was congratulated for quitting drinking IPA beer.  He is well aware it could interact with medications as well as well as disturbed his sleep cycle.  He does have some insomnia but starting to improve.  Sleep hygiene reviewed  10.  Chronic low back pain: Patient does have a history of lumbar fusion L5-S1.  He also does have a herniated disc/lumbar stenosis L4-L5.  Avoid triggering activities/positions.  11.  History of right medial meniscus/medial collateral ligament tear 3/05: Currently asymptomatic.  Avoid  triggering activities/positions  12.  Left distal anterior ankle pain: Possibly tendinitis.  Consider x-ray to rule out stress fracture if no better.  I recommended avoiding any triggering activities and using  Aleve 1 tablet twice a day with food for 10 days.  He will call me if no better/worse  13.  Left anterior lower shin rash: Possibly eczema.  Okay to use Cortaid 10 twice a day as well as moisturizing lotion.  Call if no better  14.  Labs: CBC with differential/TSH/free and total testosterone/UA/SMAC    RETURN: 6 months cholesterol/HTN/migraines

## 2019-01-04 ENCOUNTER — Ambulatory Visit: Payer: No Typology Code available for payment source | Admitting: Urology

## 2019-02-24 ENCOUNTER — Other Ambulatory Visit: Payer: Self-pay | Admitting: Primary Care

## 2019-02-24 MED ORDER — ROSUVASTATIN CALCIUM 10 MG PO TABS *I*
10.0000 mg | ORAL_TABLET | Freq: Every day | ORAL | 1 refills | Status: DC
Start: 2019-02-24 — End: 2019-02-24

## 2019-02-25 ENCOUNTER — Other Ambulatory Visit: Payer: Self-pay | Admitting: Internal Medicine

## 2019-03-16 ENCOUNTER — Other Ambulatory Visit: Payer: Self-pay | Admitting: Internal Medicine

## 2019-05-02 ENCOUNTER — Ambulatory Visit
Admission: RE | Admit: 2019-05-02 | Discharge: 2019-05-02 | Disposition: A | Discharge status: Home / Self Care | Payer: No Typology Code available for payment source | Source: Ambulatory Visit | Attending: Orthopedic Surgery | Admitting: Orthopedic Surgery

## 2019-05-02 ENCOUNTER — Ambulatory Visit: Payer: No Typology Code available for payment source | Admitting: Orthopedic Surgery

## 2019-05-02 ENCOUNTER — Encounter: Payer: Self-pay | Admitting: Orthopedic Surgery

## 2019-05-02 VITALS — BP 138/83 | HR 62 | Temp 98.2°F | Ht 69.0 in | Wt 205.0 lb

## 2019-05-02 DIAGNOSIS — Z981 Arthrodesis status: Secondary | ICD-10-CM | POA: Insufficient documentation

## 2019-05-02 DIAGNOSIS — M5136 Other intervertebral disc degeneration, lumbar region: Secondary | ICD-10-CM

## 2019-05-02 MED ORDER — GABAPENTIN 300 MG PO CAPSULE *I*: 300.0000 mg | ORAL_CAPSULE | Freq: Every evening | ORAL | 1 refills | Status: DC

## 2019-05-02 MED ORDER — MELOXICAM 15 MG PO TABS *I*
15.0000 mg | ORAL_TABLET | Freq: Every day | ORAL | 1 refills | Status: DC
Start: 2019-05-02 — End: 2019-08-23

## 2019-05-02 NOTE — Progress Notes (Signed)
HPI:  [name] returns today after not being seen in over 2 years.  He is now 43 years of age and is 22 years out from an instrumented fusion L5-S1 performed by Dr. Isabell Jarvisubery as an adolescent for spondylolisthesis.  He was last seen by this office in January 2018.  Onset and Duration:  Over the past 2 months increasing problems with back pain.  Patient was able to ski with his children and participate in a paddle board tournament without any restrictions during this time.  He denies any injury or inciting event.  Radiation:  Patient reports that he has pain that radiates into the right posterior thigh, particularly when he is seated for any period of time.  He awakens every day with his legs extremely fatigued.  Numbness and Tingling:  None  Bowel and Bladder function: normal  Exacerbating Factors:  Symptoms are significantly worse at night when he is trying to get to sleep.  He has a baseline sleep disorder.  Ameliorating Factors:  Nothing in particular  Constitutional Symptoms:  Patient reports that his urinary stream is not as forceful as usual and that it takes him a longer period of time to empty his bladder.  Previous Treatment:  He was recently started on gabapentin 100 mg at bedtime.  He is no longer exercising on a regular basis.  Is this WRI?: No  Date of WRI: N/A    The allergies, medications, PMH, PSH, FH, SH, and Problem list were reviewed by me at this encounter and documented  in eRecord.    Answers for HPI/ROS submitted by the patient on 05/01/2019   Back Pain  What is your goal for today's visit?: View back to see what is causing pain in back and legs  Handedness: Right Handed  Date of onset: : 01/12/2019  Was this the result of an injury?: No  What is your pain level?: 5/10  Please describe the quality of your pain: : aching, burning, clicking, dull ache, instability, numbness, radiating, sharp, stiffness, tingling  What treatments have you tried for this condition?: acupuncture, aspirin, corticosteroids,  gabapentin, glucosamine, herbal remedies, home exercise program, narcotics, NSAIDs, physical therapy, surgery, TENS, Toradol injections  Progression since onset: : gradually worsening  Is this a work related condition? : No  Current work status: : usual activities  Fever: No  Chills: No  Numbness: Yes  Tingling: Yes        A detailed review of systems referable to the patient's spinal disorder was conducted with the patient and/or family. Questions concerning the pulmonary, cardiac, gastrointestinal, genitourinary, hematologic, HEENT,vascular, integument, psychiatric, immunologic, ophthalmalgic,neurologic, and endocrine systems were asked. We particularly addressed issues referable to cancer, immunodeficiency, and previous experience with anesthesia.  In addition to the documented issues in the PMH, PSH, and Problem List pertinent positives include: none    Physical exam:  Patient habitus:  BMI of 30.2  Patient's gait, toe walking and heel walking were assessed. Comments:  Casual gait normal, not antalgic, no assistive device utilized.  Inspection of the back shows no midline abnormalities  Lumbar spinal deformity: None  Patient bends forward bringing hands to the:  Knees without restrictions and with no complaints of pain           Extension maneuver performed without restrictions with mild complaints of back pain.    Vascular exam is within normal limits, skin is healthy, venous exam is within acceptable limits for this age group.    Dermatomal light touch exam in  both lower extremities shows:  Slight decrease left L4  Myotomal motor exam in both lower extremities shows: Normal    Straight leg raising on the right: Normal            Straight leg raising on the left: Normal  Range of motion of the hips is full and does not affect symptoms    Imaging:  I personally reviewed lumbar x-rays taken today in the office.  There  are 5 lumbar type vertebrae.  There is evidence of an instrumented fusion L5-S1 with no signs  of loosening or hardware failure.  Significant disc space narrowing is noted at the level above the fusion L4-L5.  This has not progressed when compared to x-rays taken in 2018.  There is no new spondylolisthesis    Neuroimaging: None    Diagnosis:   Low back pain with intermittent right hamstring tightness.  Chief complaint is predominantly back pain in the setting of adjacent segment degenerative changes L4-L5 22 years after undergoing an L5-S1 fusion as an adolescent.  Treatment Options:  When the patient was last seen by Dr. Isabell Jarvis in 2013, the option of additional surgery (extension of the fusion to L4 ) was discussed.  Patient has always elected to avoid surgery, but is now giving more serious consideration to this surgical option.  We agreed to start out slowly, re-focus our efforts on core/lumbar strengthening, and restart a regular NSAID.  I would recommend him increasing his gabapentin to 300 mg at bedtime.  He was given a prescription for meloxicam.  He was offered physical therapy but declined as he knows all of the exercises required.  Patient will give these measures a try over the next 2-3 weeks.  He will contact me with an update.  If no improvement, he will likely require a new lumbar MRI.  He has worked with one of our physiatrists in the past and would be interested in retrying epidurals as well.  If none of these measures are helpful, it is likely he will meet with Dr. Isabell Jarvis to discuss his surgical options of a fusion.

## 2019-05-03 ENCOUNTER — Encounter: Payer: Self-pay | Admitting: Primary Care

## 2019-05-03 ENCOUNTER — Ambulatory Visit: Payer: No Typology Code available for payment source | Admitting: Primary Care

## 2019-05-03 DIAGNOSIS — T300 Burn of unspecified body region, unspecified degree: Secondary | ICD-10-CM
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
## 2019-05-15 ENCOUNTER — Encounter: Payer: Self-pay | Admitting: Orthopedic Surgery

## 2019-05-16 ENCOUNTER — Other Ambulatory Visit: Payer: Self-pay | Admitting: Orthopedic Surgery

## 2019-05-16 DIAGNOSIS — Z981 Arthrodesis status: Secondary | ICD-10-CM

## 2019-05-16 DIAGNOSIS — M5416 Radiculopathy, lumbar region: Secondary | ICD-10-CM

## 2019-05-23 ENCOUNTER — Ambulatory Visit: Payer: No Typology Code available for payment source | Admitting: Orthopedic Surgery

## 2019-05-28 ENCOUNTER — Encounter: Payer: Self-pay | Admitting: Orthopedic Surgery

## 2019-05-30 ENCOUNTER — Ambulatory Visit
Admission: RE | Admit: 2019-05-30 | Discharge: 2019-05-30 | Disposition: A | Discharge status: Home / Self Care | Payer: No Typology Code available for payment source | Source: Ambulatory Visit | Attending: Orthopedic Surgery | Admitting: Orthopedic Surgery

## 2019-05-30 ENCOUNTER — Other Ambulatory Visit: Payer: Self-pay | Admitting: Internal Medicine

## 2019-05-30 DIAGNOSIS — M5126 Other intervertebral disc displacement, lumbar region: Secondary | ICD-10-CM | POA: Insufficient documentation

## 2019-05-30 DIAGNOSIS — Z981 Arthrodesis status: Secondary | ICD-10-CM | POA: Insufficient documentation

## 2019-05-30 DIAGNOSIS — M4726 Other spondylosis with radiculopathy, lumbar region: Secondary | ICD-10-CM

## 2019-05-30 DIAGNOSIS — M5416 Radiculopathy, lumbar region: Secondary | ICD-10-CM

## 2019-05-30 MED ORDER — GADOBUTROL 1 MMOL/ML (GADAVIST) IV SOLN *WRAPPED*
10.0000 mL | Freq: Once | INTRAVENOUS | Status: AC
Start: 2019-05-30 — End: 2019-05-30
  Administered 2019-05-30: 10 mL via INTRAVENOUS

## 2019-06-01 ENCOUNTER — Encounter: Payer: Self-pay | Admitting: Orthopedic Surgery

## 2019-06-01 ENCOUNTER — Ambulatory Visit: Payer: No Typology Code available for payment source | Admitting: Orthopedic Surgery

## 2019-06-01 VITALS — BP 155/86 | HR 80 | Ht 69.0 in | Wt 211.0 lb

## 2019-06-01 DIAGNOSIS — G8929 Other chronic pain: Secondary | ICD-10-CM

## 2019-06-01 DIAGNOSIS — M5441 Lumbago with sciatica, right side: Secondary | ICD-10-CM

## 2019-06-01 DIAGNOSIS — Z981 Arthrodesis status: Secondary | ICD-10-CM

## 2019-06-01 DIAGNOSIS — M5136 Other intervertebral disc degeneration, lumbar region: Secondary | ICD-10-CM

## 2019-06-01 NOTE — Progress Notes (Signed)
Followup visit note:    [name] is a 43 y.o. male who returns today to review MRI for axial back pain    Avyan has been followed over the past 7 years for low back pain after undergoing an instrumented fusion L5-S1 as an adolescent for spondylolisthesis.  At the time of his last visit with Dr. Deno Etienne in 2013, patient was offered extension of the fusion to L4.  More recently, he has been seriously considering this option, and a new lumbar MRI was obtained for ongoing conversation related to the surgical option.    At the present time he has axial low back pain which affects his abilities to participate in outside activities: Skiing, hiking, running, golf, and biking.  Whenever he participates in these activities, the pain significantly worsens.  He is extremely frustrated as he always has some degree of axial back pain every day.  It prevents him from being as active as he would like.  It prevents him from participating in activities with his children.  Back pain is far worse and leg symptoms.  He has some focal complaints of pain over the right hamstring, primarily when he is driving or sitting for a long period of time.  Every day when he awakens, he has the sensation that his legs are extremely fatigued.  He is currently on gabapentin and meloxicam.  He tries to participate in a regular home exercise program, however finds that most exercises aggravate his back complaints.  In the past he has trained for marathons, but currently finds running to be extremely painful.    Pain    06/01/19 1327   PainSc:   7   PainLoc: Back       Current Outpatient Medications on File Prior to Visit   Medication Sig Dispense Refill    meloxicam (MOBIC) 15 MG tablet Take 1 tablet (15 mg total) by mouth daily Take with food. 30 tablet 1    gabapentin (GABAPENTIN) 300 MG capsule Take 1 capsule (300 mg total) by mouth nightly 30 capsule 1    rosuvastatin (CRESTOR) 10 MG tablet Take 1 tablet (10 mg total) by mouth daily 90  tablet 1    clonazePAM (KLONOPIN) 0.25 MG disintegrating tablet Take 0.25 mg by mouth daily Dr. Idolina Primer Prescribes      amphetamine-dextroamphetamine (ADDERALL XR) 15 MG 24 hr capsule Take 1 capsule (15 mg total) by mouth every morning   Max daily dose: 15 mg May sprinkle contents of capsule on food, do not chew capsule. 30 capsule 0    cholecalciferol (VITAMIN D) 2000 UNITS tablet Take 2,000 Units by mouth 2 times daily      Multiple Vitamin (MULTIVITAMIN) TABS Take 1 tablet by mouth daily      escitalopram (LEXAPRO) 20 MG tablet Take 1 tablet (20 mg total) by mouth daily 30 tablet 0     No current facility-administered medications on file prior to visit.          MRI reveals: Recent MRI dated 05/30/2019 shows disc desiccation and facet arthropathy at L4-L5.  No significant stenosis is noted.  At L3-L4 there is facet hypertrophy and T2 signal changes  noted in the facet joints.           Assessment and plan: [name] is a 43 y.o. male with adjacent segment degenerative changes status post a prior L5-S1 fusion by Dr. Deno Etienne 22 years ago.    I have reviewed his films and findings with him  today.    I spent 40 minutes of face-to-face time with Sharia ReeveJosh and his wife discussing the details and risks and benefits of fusion surgery.  I was very clear that fusion surgery is unpredictable for back pain.  We discussed a number of alternatives to surgery, including medial branch blocks/radio frequency ablation, acupuncture, additional physical therapy and weight loss.  He would like to consider the option of procedural interventions and I will refer him to Dr. Hessie DibbleBen Laplante.    I have answered all of his questions to his satisfaction, he is agreeable to the treatment plan.

## 2019-06-05 ENCOUNTER — Encounter: Payer: Self-pay | Admitting: Physical Medicine and Rehabilitation

## 2019-06-05 ENCOUNTER — Ambulatory Visit: Payer: No Typology Code available for payment source | Admitting: Physical Medicine and Rehabilitation

## 2019-06-05 VITALS — BP 131/86 | HR 70 | Ht 69.0 in | Wt 211.0 lb

## 2019-06-05 DIAGNOSIS — M47817 Spondylosis without myelopathy or radiculopathy, lumbosacral region: Secondary | ICD-10-CM

## 2019-06-05 NOTE — Assessment & Plan Note (Signed)
1. Axial low back pain c/w painful facet joint arthrosis L45 vs IDD L45.  2. H/o L5S1 fusion.    1. Continue current analgesic regimen.  2. Continue home exercise program.  3. Will assess b/l L45 facet joints and if positive x 2, neurotomy.  4. If negative, he will follow up with Ned Clines for consideration of extension of fusion to L4.  5. Risks, benefits, and follow up discussed and IPPOC form completed in office today.

## 2019-06-05 NOTE — Progress Notes (Signed)
History of Present Illness  Chief Complaint   Patient presents with    Lower Back - Follow-up       Back Pain   This is a recurrent problem. Episode onset: 12 years with worsening over the past 3 years. The problem occurs constantly. The problem has been gradually worsening since onset. Pain location: midline and left paramidline lumbosacral region, b/l anterior and posterior thighs. The quality of the pain is described as aching (deep). The pain is at a severity of 7/10. The symptoms are aggravated by standing (walking. Mitigated by lying down). Treatments tried: L5S1 fusion 1999 (Dr. Isabell Jarvisubery) with relief, unspecified ESI's 2008 with initial relief, multiple sessions of physical therapy including 3-4 years without relief, chiropractic treatment 2008 without relief. Improvement on treatment: current pain meds include gabapentin; prior pain meds include meloxicam.             Review of Systems   Constitutional: Negative.    Eyes: Negative.    Respiratory: Negative.    Cardiovascular: Negative.    Gastrointestinal: Negative.    Endocrine: Negative.    Genitourinary: Negative.    Musculoskeletal: Positive for back pain.   Neurological: Negative.    Hematological: Negative.    Psychiatric/Behavioral: Negative.         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    Vertigo     Steroid injection into right ear 12/17/2011     Social History     Socioeconomic History    Marital status: Married     Spouse name: Not on file    Number of children: 2    Years of education: Not on file    Highest education level: Not on file   Tobacco Use    Smoking  status: Former Smoker     Packs/day: 1.00     Years: 6.00     Pack years: 6.00     Types: Cigarettes     Last attempt to quit: 12/07/2005     Years since quitting: 13.5    Smokeless tobacco: Never Used    Tobacco comment: Quit 2007   Substance and Sexual Activity    Alcohol use: Yes     Alcohol/week: 4.0 standard drinks     Types: 4 Standard drinks or equivalent per week     Comment: social    Drug use: Yes     Types: Marijuana    Sexual activity: Yes     Partners: Female     Birth control/protection: Condom   Other Topics Concern    Not on file   Social History Narrative    Not on file     Family History   Problem Relation Age of Onset    High cholesterol Father     Hypertension Father     Cancer Father     High Blood Pressure Father     Conversion Other         [id number]^Family Health Status^^Active^Father(A)HTN.Chol.Allergies Mother(A)Migraines 1 Brother(A)ADD 2 Sisters(A) 1 Daughter(A) PatGF(A)CAD.Glaucoma  DM2.Chol PatGM(D)49.MI MatGF(D)60s.Bone Ca  MatGM(A)    Hearing loss Maternal Grandmother     Cancer Maternal Grandfather           I have reviewed the patient's medications and allergies and updated them as appropriate in the electronic medical record.       Objective:     Today's vitals:  height is 1.753 m (5\' 9" ) and weight is 95.7 kg (211 lb). His blood pressure is 131/86 and his pulse is 70.      Physical Exam   Constitutional: He appears well-developed.   HENT:   Head: Normocephalic.   Pulmonary/Chest: Effort normal.   Neurological: He has normal strength. Gait normal.   Skin: Skin is dry.   Well healed midline lumbosacral scar.   Vitals reviewed.    Neurologic Exam     Mental Status   Level of consciousness: alert    Motor Exam     Strength   Strength 5/5 throughout.     Sensory Exam   Light touch normal.     Gait, Coordination, and Reflexes     Gait  Gait: normal    Reflexes   Reflexes 2+ except as noted.   Right Hoffman: absent  Left Hoffman: absent  Right ankle clonus: absent  Left ankle  clonus: absent                 MRI L/S spine 05/30/19    Posterior fusion hardware and pedicle screws L5S1.  75% disc height loss with type I modic changes L45.  Moderate desiccation with <25% disc height loss L23 and L34.    Assessment/Plan       Lumbosacral spondylosis without myelopathy  1. Axial low back pain c/w painful facet joint arthrosis L45 vs IDD L45.  2. H/o L5S1 fusion.    1. Continue current analgesic regimen.  2. Continue home exercise program.  3. Will assess b/l L45 facet joints and if positive x 2, neurotomy.  4. If negative, he will follow up with Ned Clines for consideration of extension of fusion to L4.  5. Risks, benefits, and follow up discussed and IPPOC form completed in office today.                     Follow up  recommended: No follow-ups on file.     Patient verbalized understanding of the above instructions.     Current Outpatient Medications   Medication    meloxicam (MOBIC) 15 MG tablet    gabapentin (GABAPENTIN) 300 MG capsule    rosuvastatin (CRESTOR) 10 MG tablet    clonazePAM (KLONOPIN) 0.25 MG disintegrating tablet    amphetamine-dextroamphetamine (ADDERALL XR) 15 MG 24 hr capsule    cholecalciferol (VITAMIN D) 2000 UNITS tablet    Multiple Vitamin (MULTIVITAMIN) TABS    escitalopram (LEXAPRO) 20 MG tablet     No current facility-administered medications for this visit.        Boyd Buffalo L Judie Hollick, DO  06/05/2019     Lakeshia Dohner L Thorvald Orsino, DO  Electronically signed on 06/05/2019 at 8:52 AM.

## 2019-06-13 ENCOUNTER — Other Ambulatory Visit: Payer: No Typology Code available for payment source

## 2019-06-14 ENCOUNTER — Ambulatory Visit
Payer: No Typology Code available for payment source | Attending: Physical Medicine and Rehabilitation | Admitting: Physical Medicine and Rehabilitation

## 2019-06-14 ENCOUNTER — Ambulatory Visit
Admission: RE | Admit: 2019-06-14 | Discharge: 2019-06-14 | Disposition: A | Discharge status: Home / Self Care | Payer: No Typology Code available for payment source | Source: Ambulatory Visit | Admitting: Radiology

## 2019-06-14 ENCOUNTER — Other Ambulatory Visit: Payer: Self-pay | Admitting: Physical Medicine and Rehabilitation

## 2019-06-14 ENCOUNTER — Encounter: Payer: Self-pay | Admitting: Physical Medicine and Rehabilitation

## 2019-06-14 VITALS — BP 142/82 | HR 73 | Temp 98.8°F | Resp 16 | Ht 69.0 in | Wt 211.0 lb

## 2019-06-14 DIAGNOSIS — M545 Low back pain, unspecified: Secondary | ICD-10-CM

## 2019-06-14 DIAGNOSIS — M47817 Spondylosis without myelopathy or radiculopathy, lumbosacral region: Secondary | ICD-10-CM

## 2019-06-14 NOTE — Procedures (Signed)
Date/Time: 06/14/2019  3:10 PM EDT  Facet Joint Procedure(s): Medial Branch Block - lumbar or sacral 1st level - CPT [id number]  Laterality: Bilateral  Contrast - Left: 1 mL iohexol 240 MG/ML  Contrast - Right: 1 mL iohexol 240 MG/ML  Anesthetic medication - Left:  1 mL lidocaine 2 %  Anesthetic medications - Right: 1 mL lidocaine 2 %    PROCEDURES PERFORMED: Diagnostic L3 medial branch and L4 medial branch blockade.    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    PROCEDURE: After raising a skin wheal with 1% Xylocaine and anesthetizing the subcutaneous tissues, a #22 gauge 3.5 inch spinal needle was placed under fluoroscopic control adjacent to the L3 medial branch at the junction of the L4 superior articular and transverse processes. A second #22 gauge 3.5 inch spinal needle was placed under fluoroscopic control adjacent to the L4 medial branch at the junction of the L5 superior articular and transverse processes. Approximately 0.5 mL Omnipaque M-240 contrast dye was injected, demonstrating focal spread. Subsequent to this, a total of 0.5 mL of 2% preservative free Xylocaine was injected onto each of the L3 and the L4 medial branches.    EBL: Less than 46mL    Specimens removed: None.

## 2019-06-14 NOTE — Progress Notes (Signed)
Has driver.

## 2019-06-14 NOTE — Invasive Procedure Plan of Care (Signed)
[facility]  [facility]  [facility]    CONSENT FOR MEDICAL  OR SURGICAL PROCEDURE                            Patient Name: Carlos ReJosh Edward Reveron  [id number]                                                              DOB: 11-17-1976         Please read this form or have someone read it to you.   It's important to understand all parts of this form. If something isn't clear, ask us to explain.   When you sign it, that means you understand the form and give us permission to do this surgery or procedure.     I agree for Atilano MedianLaplante, Tove Wideman Louis, DO along with any assistants* they may choose, to treat the following condition(s): Pain   By doing this surgery or procedure on me: Injection into your spine   This is also known as: Diagnostic #1 L3,4 medial branch block   Laterality: Bilateral     *if you'd like a list of the assistants, please ask. We can give that to you.    1. The care provider has explained my condition to me. They have told me how the procedure can help me. They have told me about other ways of treating my condition. I understand the care provider cannot guarantee the result of the procedure. If I don't have this procedure, my other choices are: No injection, physical therapy and medications    2. The care provider has told me the risks (problems that can happen) of the procedure. I understand there may be unwanted results. The risks that are related to this procedure include: Infection, bleeding, nerve damage, spinal cord injury, stroke, death    3. I understand that during the procedure, my care provider may find a condition that we didn't know about before the treatment started. Therefore, I agree that my care provider can perform any other treatment which they think is necessary and available.    4. I understand the care provider may remove tissue, body parts, or materials during this procedure. These materials may be used to help with my diagnosis and treatment. They  might also be used for teaching purposes or for research studies that I have separately agreed to participate in. Otherwise they will be disposed of as required by law.    5. My care provider might want a representative from a medical device company to be there during my procedure. I understand that person works for:          The ways they might help my care provider during my procedure include:            6. Here are my decisions about receiving blood, blood products, or tissues. I understand my decisions cover the time before, during and after my procedure, my treatment, and my time in the hospital. After my procedure, if my condition changes a lot, my care provider will talk with me again about receiving blood or blood products. At that time, my care provider might need me to review and sign another consent form, about  getting or refusing blood.    I understand that the blood is from the community blood supply. Volunteers donated the blood, the volunteers were screened for health problems. The blood was examined with very sensitive and accurate tests to look for hepatitis, HIV/AIDS, and other diseases. Before I receive blood, it is tested again to make sure it is the correct type.    My chances of getting a sickness from blood products are small. But no transfusion is 100% safe. I understand that my care provider feels the good I will receive from the blood is greater than the chances of something going wrong. My care provider has answered my questions about blood products.      My decision  about blood or  blood products   Not applicable.        My decision   about tissue  Implants     Not applicable.          I understand this  form.    My care provider  or his/her  assistants have  explained:   What I am having done and why I need it.  What other choices I can make instead of having this done.  The benefits and possible risks (problems) to me of having this done.  The benefits and possible risks (problems) to  me of receiving transplants, blood, or blood products.  There is no guarantee of the results.  The care provider may not stay with me the entire time that I am in the operating or procedure room.  My provider has explained how this may affect my procedure. My provider has answered my  questions about this.         I give my  permission for  this surgery or  procedure.            _______________________________________________                                     My signature  (or parent or other person authorized to sign for you, if you are unable to sign for  yourself or if you are under 42 years old)        ______           Date        _____        Time   Electronic Signatures will display at the bottom of the consent form.    Care provider's statement: I have discussed the planned procedure, including the possibility for transfusion of blood  products or receipt of tissue as necessary; expected benefits; the possible complications and risks; and possible alternatives  and their benefits and risks with the patients or the patient's surrogate. In my opinion, the patient or the patient's surrogate  understands the proposed procedure, its risks, benefits and alternatives.              Electronically signed by: Maureen Ralphs, DO                                                06/14/2019         Date        3:02 PM        Time

## 2019-06-14 NOTE — Patient Instructions (Signed)
Post Spinal Injection/ / Procedure Instructions    Instructions:  Notify the office of the doctor who performed your procedure if you experience any of the following:   Fever of 100 degrees Fahrenheit or 38 degrees Celsius or greater.   Excessive swelling or redness at the injection site.   Excessive pain or numbness that lasts longer than 72 hours after your injections.   Severe headache that goes away when you lie flat and comes back when you sit upright    Activity:   Rest after your injection for 24 hours, then return to your usual activities.  This includes any restrictions your physician may have prescribed.   Do not drive the day of your injection.    Care of the injection site:   You may ice the injection site for local discomfort.  Cover ice with a cloth and apply light pressure.  Never place ice directly on the skin.   Apply ice for 15 minutes on then off for 1 hour.  Repeat as needed.   Take band aids off the following day.    For diabetic patients:   Check your blood sugar level every 8 hours for the next 72 hours.  If it is greater than 250, call your primary care physician.    Medications:   You may continue taking current medications.   Remember, the steroid you had injected today usually takes 1 to 3 days to take effect, however it can take up to 7 days.    Contacts/Questions:  In case of an urgent issue or questions:   Dr. Serita Grit secretary, Lattie Haw can be reached at [phone]   Dr. Vista Lawman secretary, Katharine Look can be reached at [phone]   Dr. Glenford Bayley secretary, Caryl Pina can be reached at [phone]   Dr. Eli Hose secretary, Katharine Look can be reached at [phone]   Call the answering service at [phone] after 4:30 PM, on the weekend or a holiday.  Ask for the physician on call.    Follow up:  Please test it and record your results on the chart provided. Dr. Kandis Nab office will reach out to you tomorrow to discuss your result/ plan  based on your testing  today.    Dr. Kandis Nab office: [phone]

## 2019-06-15 ENCOUNTER — Telehealth: Payer: Self-pay

## 2019-06-15 DIAGNOSIS — M47817 Spondylosis without myelopathy or radiculopathy, lumbosacral region: Secondary | ICD-10-CM

## 2019-06-15 MED ORDER — IOHEXOL 240 MG/ML (OMNIPAQUE) IV SOLN *I*
1.0000 mL | Freq: Once | INTRAMUSCULAR | Status: AC | PRN
Start: 2019-06-14 — End: 2019-06-14
  Administered 2019-06-14: 1 mL

## 2019-06-15 MED ORDER — LIDOCAINE HCL 2 % IJ SOLN *I*
1.0000 mL | Freq: Once | INTRAMUSCULAR | Status: AC | PRN
Start: 2019-06-14 — End: 2019-06-14
  Administered 2019-06-14: 1 mL

## 2019-06-15 NOTE — Telephone Encounter (Addendum)
Date/Time: 06/14/2019  3:10 PM EDT    Facet Joint Procedure(s): Medial Branch Block - lumbar or sacral 1st level - CPT [id number]  Laterality: Bilateral    PROCEDURES PERFORMED: Diagnostic L3 medial branch and L4 medial branch blockade.    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    Patient stated for 2 hours after injection, pain was about a 2-3.  By 7:30pm, pain back up to 7.

## 2019-06-16 ENCOUNTER — Other Ambulatory Visit: Payer: Self-pay | Admitting: Physical Medicine and Rehabilitation

## 2019-06-16 DIAGNOSIS — M47817 Spondylosis without myelopathy or radiculopathy, lumbosacral region: Secondary | ICD-10-CM

## 2019-06-16 NOTE — Addendum Note (Signed)
Addended by: Ailene Ravel A on: 06/16/2019 11:15 AM     Modules accepted: Orders

## 2019-06-17 ENCOUNTER — Other Ambulatory Visit: Payer: Self-pay

## 2019-06-17 MED FILL — Duloxetine HCl Enteric Coated Pellets Cap 20 MG (Base Eq): ORAL | 30 days supply | Qty: 30 | Fill #0 | Status: AC

## 2019-06-20 ENCOUNTER — Other Ambulatory Visit: Payer: Self-pay

## 2019-06-20 MED FILL — Gabapentin Cap 100 MG: ORAL | 30 days supply | Qty: 90 | Fill #0 | Status: AC

## 2019-06-21 ENCOUNTER — Other Ambulatory Visit: Payer: Self-pay

## 2019-06-22 ENCOUNTER — Ambulatory Visit: Payer: No Typology Code available for payment source | Admitting: Physical Medicine and Rehabilitation

## 2019-06-23 ENCOUNTER — Other Ambulatory Visit: Payer: Self-pay

## 2019-06-29 ENCOUNTER — Other Ambulatory Visit: Payer: Self-pay | Admitting: Physical Medicine and Rehabilitation

## 2019-06-29 DIAGNOSIS — M545 Low back pain, unspecified: Secondary | ICD-10-CM

## 2019-07-04 ENCOUNTER — Ambulatory Visit
Admission: RE | Admit: 2019-07-04 | Discharge: 2019-07-04 | Disposition: A | Discharge status: Home / Self Care | Payer: No Typology Code available for payment source | Source: Ambulatory Visit | Attending: Physical Medicine and Rehabilitation | Admitting: Physical Medicine and Rehabilitation

## 2019-07-04 ENCOUNTER — Other Ambulatory Visit: Payer: Self-pay | Admitting: Physical Medicine and Rehabilitation

## 2019-07-04 ENCOUNTER — Encounter
Admission: RE | Disposition: A | Discharge status: Home / Self Care | Payer: Self-pay | Source: Ambulatory Visit | Attending: Physical Medicine and Rehabilitation

## 2019-07-04 ENCOUNTER — Encounter: Payer: Self-pay | Admitting: Physical Medicine and Rehabilitation

## 2019-07-04 DIAGNOSIS — M47817 Spondylosis without myelopathy or radiculopathy, lumbosacral region: Secondary | ICD-10-CM | POA: Insufficient documentation

## 2019-07-04 DIAGNOSIS — M545 Low back pain, unspecified: Secondary | ICD-10-CM

## 2019-07-04 HISTORY — PX: PR NJX DX/THER AGT PVRT FACET JT LMBR/SAC 1 LEVEL: 64493

## 2019-07-04 SURGERY — INJECTION, FACET JOINT
Anesthesia: Local | Site: Spine Lumbar | Laterality: Bilateral | Wound class: Clean

## 2019-07-04 MED ORDER — BUPIVACAINE HCL 0.5 % IJ SOLUTION *WRAPPED*
INTRAMUSCULAR | Status: AC
Start: 2019-07-04 — End: 2019-07-04
  Filled 2019-07-04: qty 10

## 2019-07-04 MED ORDER — IOHEXOL 240 MG/ML (OMNIPAQUE) IV SOLN *I*
INTRAMUSCULAR | Status: DC | PRN
Start: 2019-07-04 — End: 2019-07-04
  Administered 2019-07-04 (×4): 1 mL

## 2019-07-04 MED ORDER — BUPIVACAINE HCL 0.5 % IJ SOLUTION *WRAPPED*
INTRAMUSCULAR | Status: DC | PRN
Start: 2019-07-04 — End: 2019-07-04
  Administered 2019-07-04 (×4): .5 mL via SUBCUTANEOUS

## 2019-07-04 MED ORDER — LIDOCAINE HCL 2 % IJ SOLN *I*
INTRAMUSCULAR | Status: DC | PRN
Start: 2019-07-04 — End: 2019-07-04
  Administered 2019-07-04: 10 mL via SUBCUTANEOUS

## 2019-07-04 SURGICAL SUPPLY — 14 items
BANDAGE BANDAID 3X3/4 IN FLX N (Dressing) ×8 IMPLANT
CONTRAST OMNIPAQUE PLUSPAK 240MG 50ML (Other) ×2 IMPLANT
GLOVE SURG PROTEXIS PI CLASSIC 8.0 PF SYN (Glove) ×2 IMPLANT
NEEDLE HYPO BVL LF 25G X 1.5IN (Needle) ×2 IMPLANT
NEEDLE QUINCKE SPI 22G X 3.5IN (Needle) IMPLANT
NEEDLE QUINCKE SPI 22G X 5IN LF (Needle) ×8 IMPLANT
NEEDLE QUINCKE SPI 25G X 2IN LF (Needle) IMPLANT
PACK TOWEL LIGHT BLUE STERILE (Pack) ×2 IMPLANT
PEN SURG MARKER WITH RULER (Supply) ×2 IMPLANT
PREP CHG DYNA-HEX LIQUID 4PCT 4OZ (Other) ×2 IMPLANT
SET EXT 12IN W/CLAMP SMALLBORE (Supply) ×2 IMPLANT
STERILE MEDICATION LABELS (Other) IMPLANT
SYRINGE LUERLOCK 5ML INDIVIDUAL WRAP (Syringe) ×8 IMPLANT
TRAY CAUDAL EPIDURAL (Tray) ×2 IMPLANT

## 2019-07-04 NOTE — Op Note (Addendum)
Operative Note (Surgical Case/Log ID: [id number])       Date of Surgery: 07/04/2019       Surgeons: Surgeon(s) and Role:     * Marrian Bells, Yetta Barre, DO - Primary       Pre-op Diagnosis: Pre-Op Diagnosis Codes:     * Lumbosacral spondylosis without myelopathy [M47.817]       Post-op Diagnosis: Post-Op Diagnosis Codes:     * Lumbosacral spondylosis without myelopathy [M47.817]       Procedure(s) Performed: Procedure(s) (LRB):  DX #2 BILAT MBB L3,4 (Bilateral)       Anesthesia Type: Anesthesia type not filed in the log.        Fluid Totals: No intake/output data recorded.       Estimated Blood Loss: * No values recorded between 07/04/2019 12:00 AM and 07/04/2019 12:48 PM *       Specimens to Pathology:  * No specimens in log *       Temporary Implants:        Packing:                 Patient Condition: good       Indications: pain       Findings (Including unexpected complications): none     Description of Procedure: Diagnostic #2 L3 medial branch and L4 medial branch blockade.    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    PROCEDURE: After raising a skin wheal with 1% Xylocaine and anesthetizing the subcutaneous tissues, a #22 gauge 3.5 inch spinal needle was placed under fluoroscopic control adjacent to the L3 medial branch at the junction of the L4 superior articular and transverse processes. A second #22 gauge 3.5 inch spinal needle was placed under fluoroscopic control adjacent to the L4 medial branch at the junction of the L5 superior articular and transverse processes. Approximately 0.5 mL Omnipaque M-240 contrast dye was injected, demonstrating focal spread. Subsequent to this, a total of 0.5 mL of 0.5% preservative free Bupivacaine was injected onto each of the L3 and the L4 medial branches.    The patient reported 80% improvement in his index pain.     EBL: Less than 6mL    Specimens removed: None.    Signed:  Leilanee Righetti L Dashanti Burr, DO  on 07/04/2019 at 12:48 PM

## 2019-07-04 NOTE — OR Nursing (Signed)
Vitals:      153/88, 66, 18, 98%    156/91, 67, 18, 100%    143/99, 67, 18, 100%

## 2019-07-04 NOTE — Invasive Procedure Plan of Care (Signed)
[place]  OR SURGICAL PROCEDURE                            Patient Name: [name]  [facility] [address] MR                                                              DOB: 11/13/1976         Please read this form or have someone read it to you.   It's important to understand all parts of this form. If something isn't clear, ask [place] to explain.   When you sign it, that means you understand the form and give [place] permission to do this surgery or procedure.     I agree for Sheppard Evens, DO along with any assistants* they may choose, to treat the following condition(s): Pain   By doing this surgery or procedure on me: Injection into your spine   This is also known as: Diagnostic #2 L3,4 medial branch block   Laterality: Bilateral     *if you'd like a list of the assistants, please ask. We can give that to you.    1. The care provider has explained my condition to me. They have told me how the procedure can help me. They have told me about other ways of treating my condition. I understand the care provider cannot guarantee the result of the procedure. If I don't have this procedure, my other choices are: No injection, physical therapy and medications    2. The care provider has told me the risks (problems that can happen) of the procedure. I understand there may be unwanted results. The risks that are related to this procedure include: Infection, bleeding, nerve damage, spinal cord injury, stroke, death    3. I understand that during the procedure, my care provider may find a condition that we didn't know about before the treatment started. Therefore, I agree that my care provider can perform any other treatment which they think is necessary and available.    4. I understand the care provider may remove tissue, body parts, or materials during this procedure. These materials may be used to help with my diagnosis and treatment. They  might also be used for teaching purposes or for research studies that I have separately agreed to participate in. Otherwise they will be disposed of as required by law.    5. My care provider might want a representative from a [employer] to be there during my procedure. I understand that person works for:          The ways they might help my care provider during my procedure include:            6. Here are my decisions about receiving blood, blood products, or tissues. I understand my decisions cover the time before, during and after my procedure, my treatment, and my time in the hospital. After my procedure, if my condition changes a lot, my care provider will talk with me again about receiving blood or blood products. At that time, my care provider might need me to review and sign another consent form, about  getting or refusing blood.    I understand that the blood is from the community blood supply. Volunteers donated the blood, the volunteers were screened for health problems. The blood was examined with very sensitive and accurate tests to look for hepatitis, HIV/AIDS, and other diseases. Before I receive blood, it is tested again to make sure it is the correct type.    My chances of getting a sickness from blood products are small. But no transfusion is 100% safe. I understand that my care provider feels the good I will receive from the blood is greater than the chances of something going wrong. My care provider has answered my questions about blood products.      My decision  about blood or  blood products   Not applicable.        My decision   about tissue  Implants     Not applicable.          I understand this  form.    My care provider  or his/her  assistants have  explained:   What I am having done and why I need it.  What other choices I can make instead of having this done.  The benefits and possible risks (problems) to me of having this done.  The benefits and possible risks (problems) to  me of receiving transplants, blood, or blood products.  There is no guarantee of the results.  The care provider may not stay with me the entire time that I am in the operating or procedure room.  My provider has explained how this may affect my procedure. My provider has answered my  questions about this.         I give my  permission for  this surgery or  procedure.            _______________________________________________                                     My signature  (or parent or other person authorized to sign for you, if you are unable to sign for  yourself or if you are under 42 years old)        ______           Date        _____        Time   Electronic Signatures will display at the bottom of the consent form.    Care provider's statement: I have discussed the planned procedure, including the possibility for transfusion of blood  products or receipt of tissue as necessary; expected benefits; the possible complications and risks; and possible alternatives  and their benefits and risks with the patients or the patient's surrogate. In my opinion, the patient or the patient's surrogate  understands the proposed procedure, its risks, benefits and alternatives.              Electronically signed by: Tamera ReasonBEN L Tashanti Dalporto, DO                                                07/04/2019         Date        9:00 AM        Time

## 2019-07-04 NOTE — Discharge Instructions (Signed)
Ben Laplante, DO  [phone]      POST SPINAL/JOINT INJECTION/PROCEDURE INSTRUCTIONS    Notify the office if you experience any of the following:    · Fever of 100 degrees Fahrenheit or 38 degrees Celsius or greater  · Excessive swelling or redness at the injection site  · Excessive pain or numbness that lasts longer than 72 hours after your injection  · Severe headache that goes away when you lie flat and comes back when you sit upright      ACTIVITY:  · Rest after your injection for 24 hours, then return to your usual activities.  This includes any restrictions your physician may have prescribed  · Do not drive the day of your injection    CARE OF THE INJECTION SITE:  · You may ice the injection site for local discomfort.  Cover the ice bag with a cloth and apply light pressure.  Never place ice directly on the skin.  Apply ice for 15 minutes on, then 1 hour off.  Repeat as needed  · Take bandaids off the following day    FOR DIABETIC PATIENTS:  · Check your blood sugar level every 8 hours for the next 72 hours.  If it is greater than 250, call your primary care physician    MEDICATIONS:  · You may resume your pre-injection medications as previously prescribed  · Remember, the steroid you had injected today usually takes 1 to 3 days to take effect, however it can take up to 7 days    CONTACTS/QUESTIONS:  · In case of an urgent issue or question:  Dr. Laplante's secretary, Denise can be reached at [phone]  · Call the answering service at [phone] after 4:30 PM, on the weekend or on a holiday.  Ask for the physician on call.    FOLLOW-UP OFFICE VISIT:  You need to call the appointment center at [phone] for a follow-up office visit with Dr. Laplante in weeks or the next available appointment.

## 2019-07-11 ENCOUNTER — Other Ambulatory Visit: Payer: Self-pay | Admitting: Orthopedic Surgery

## 2019-07-11 ENCOUNTER — Other Ambulatory Visit: Payer: Self-pay

## 2019-07-11 MED ORDER — GABAPENTIN 300 MG PO CAPSULE *I*
300.0000 mg | ORAL_CAPSULE | Freq: Every evening | ORAL | 1 refills | Status: DC
Start: 2019-07-11 — End: 2019-08-23
  Filled 2019-07-11: qty 30, 30d supply, fill #0

## 2019-07-11 MED FILL — Duloxetine HCl Enteric Coated Pellets Cap 20 MG (Base Eq): ORAL | 30 days supply | Qty: 30 | Fill #0 | Status: AC

## 2019-07-14 ENCOUNTER — Ambulatory Visit: Payer: No Typology Code available for payment source | Admitting: Primary Care

## 2019-07-17 ENCOUNTER — Other Ambulatory Visit: Payer: Self-pay

## 2019-07-17 ENCOUNTER — Other Ambulatory Visit: Payer: Self-pay | Admitting: Physical Medicine and Rehabilitation

## 2019-07-17 NOTE — Progress Notes (Signed)
[facility]  [facility]  [facility]    CONSENT FOR MEDICAL  OR SURGICAL PROCEDURE                            Patient Name: Carlos ReJosh Edward Nason  [id number]                                                              DOB: 06-Mar-1976         Please read this form or have someone read it to you.   It's important to understand all parts of this form. If something isn't clear, ask us to explain.   When you sign it, that means you understand the form and give us permission to do this surgery or procedure.     I agree for Atilano MedianLaplante, Braidyn Peace Louis, DO along with any assistants* they may choose, to treat the following condition(s): Pain   By doing this surgery or procedure on me: Injection into your spine   This is also known as: L3,4 neurotomy   Laterality: Right     *if you'd like a list of the assistants, please ask. We can give that to you.    1. The care provider has explained my condition to me. They have told me how the procedure can help me. They have told me about other ways of treating my condition. I understand the care provider cannot guarantee the result of the procedure. If I don't have this procedure, my other choices are: No injection, physical therapy and medications    2. The care provider has told me the risks (problems that can happen) of the procedure. I understand there may be unwanted results. The risks that are related to this procedure include: Infection, bleeding, nerve damage, spinal cord injury, stroke, death    3. I understand that during the procedure, my care provider may find a condition that we didn't know about before the treatment started. Therefore, I agree that my care provider can perform any other treatment which they think is necessary and available.    4. I understand the care provider may remove tissue, body parts, or materials during this procedure. These materials may be used to help with my diagnosis and treatment. They might also be used for teaching  purposes or for research studies that I have separately agreed to participate in. Otherwise they will be disposed of as required by law.    5. My care provider might want a representative from a medical device company to be there during my procedure. I understand that person works for:          The ways they might help my care provider during my procedure include:            6. Here are my decisions about receiving blood, blood products, or tissues. I understand my decisions cover the time before, during and after my procedure, my treatment, and my time in the hospital. After my procedure, if my condition changes a lot, my care provider will talk with me again about receiving blood or blood products. At that time, my care provider might need me to review and sign another consent form, about getting or refusing blood.  I understand that the blood is from the community blood supply. Volunteers donated the blood, the volunteers were screened for health problems. The blood was examined with very sensitive and accurate tests to look for hepatitis, HIV/AIDS, and other diseases. Before I receive blood, it is tested again to make sure it is the correct type.    My chances of getting a sickness from blood products are small. But no transfusion is 100% safe. I understand that my care provider feels the good I will receive from the blood is greater than the chances of something going wrong. My care provider has answered my questions about blood products.      My decision  about blood or  blood products   Not applicable.        My decision   about tissue  Implants     Not applicable.          I understand this  form.    My care provider  or his/her  assistants have  explained:   What I am having done and why I need it.  What other choices I can make instead of having this done.  The benefits and possible risks (problems) to me of having this done.  The benefits and possible risks (problems) to me of receiving transplants,  blood, or blood products.  There is no guarantee of the results.  The care provider may not stay with me the entire time that I am in the operating or procedure room.  My provider has explained how this may affect my procedure. My provider has answered my  questions about this.         I give my  permission for  this surgery or  procedure.            _______________________________________________                                     My signature  (or parent or other person authorized to sign for you, if you are unable to sign for  yourself or if you are under 52 years old)        ______           Date        _____        Time   Electronic Signatures will display at the bottom of the consent form.    Care provider's statement: I have discussed the planned procedure, including the possibility for transfusion of blood  products or receipt of tissue as necessary; expected benefits; the possible complications and risks; and possible alternatives  and their benefits and risks with the patients or the patient's surrogate. In my opinion, the patient or the patient's surrogate  understands the proposed procedure, its risks, benefits and alternatives.              Electronically signed by: Maureen Ralphs, DO                                                07/17/2019         Date        1:30 PM        Time

## 2019-07-18 ENCOUNTER — Other Ambulatory Visit: Payer: Self-pay

## 2019-07-18 MED ORDER — DULOXETINE HCL 30 MG PO CPEP *I*
DELAYED_RELEASE_CAPSULE | ORAL | 2 refills | Status: DC
Start: 2019-07-19 — End: 2019-09-22
  Filled 2019-07-24: qty 30, 30d supply, fill #0

## 2019-07-18 MED ORDER — GABAPENTIN 100 MG PO CAPS
ORAL_CAPSULE | ORAL | 0 refills | Status: DC
Start: 2019-07-19 — End: 2019-08-23

## 2019-07-19 ENCOUNTER — Other Ambulatory Visit: Payer: Self-pay

## 2019-07-19 ENCOUNTER — Other Ambulatory Visit: Payer: Self-pay | Admitting: Physical Medicine and Rehabilitation

## 2019-07-19 DIAGNOSIS — M545 Low back pain, unspecified: Secondary | ICD-10-CM

## 2019-07-19 MED ORDER — TRAZODONE HCL 50 MG PO TABS *I*
ORAL_TABLET | ORAL | 0 refills | Status: DC
Start: 2019-07-18 — End: 2019-08-16
  Filled 2019-07-19: qty 30, 30d supply, fill #0

## 2019-07-20 ENCOUNTER — Ambulatory Visit
Admission: RE | Admit: 2019-07-20 | Discharge: 2019-07-20 | Disposition: A | Discharge status: Home / Self Care | Payer: No Typology Code available for payment source | Source: Ambulatory Visit | Attending: Physical Medicine and Rehabilitation | Admitting: Physical Medicine and Rehabilitation

## 2019-07-20 ENCOUNTER — Ambulatory Visit: Payer: No Typology Code available for payment source | Admitting: Physical Medicine and Rehabilitation

## 2019-07-20 ENCOUNTER — Encounter: Payer: Self-pay | Admitting: Physical Medicine and Rehabilitation

## 2019-07-20 VITALS — BP 134/80 | HR 78 | Temp 96.8°F | Resp 16

## 2019-07-20 DIAGNOSIS — M545 Low back pain, unspecified: Secondary | ICD-10-CM

## 2019-07-20 DIAGNOSIS — M47817 Spondylosis without myelopathy or radiculopathy, lumbosacral region: Secondary | ICD-10-CM

## 2019-07-20 MED ORDER — LIDOCAINE HCL 2 % IJ SOLN *I*
3.0000 mL | Freq: Once | INTRAMUSCULAR | Status: AC | PRN
Start: 2019-07-20 — End: 2019-07-20
  Administered 2019-07-20: 3 mL

## 2019-07-20 MED ORDER — LIDOCAINE HCL 1 % IJ SOLN *I*
5.0000 mL | Freq: Once | INTRAMUSCULAR | Status: AC | PRN
Start: 2019-07-20 — End: 2019-07-20
  Administered 2019-07-20: 5 mL via SUBCUTANEOUS

## 2019-07-20 NOTE — Patient Instructions (Signed)
Post Spinal Injection/ / Procedure Instructions    Instructions:  Notify the office of the doctor who performed your procedure if you experience any of the following:   Fever of 100 degrees Fahrenheit or 38 degrees Celsius or greater.   Excessive swelling or redness at the injection site.   Excessive pain or numbness that lasts longer than 72 hours after your injections.   Severe headache that goes away when you lie flat and comes back when you sit upright    Activity:   Rest after your injection for 24 hours, then return to your usual activities.  This includes any restrictions your physician may have prescribed.   Do not drive the day of your injection.    Care of the injection site:   You may ice the injection site for local discomfort.  Cover ice with a cloth and apply light pressure.  Never place ice directly on the skin.   Apply ice for 15 minutes on then off for 1 hour.  Repeat as needed.   Take band aids off the following day.    For diabetic patients:   Check your blood sugar level every 8 hours for the next 72 hours.  If it is greater than 250, call your primary care physician.    Medications:   You may continue taking current medications.   Remember, the steroid you had injected today usually takes 1 to 3 days to take effect, however it can take up to 7 days.    Contacts/Questions:  In case of an urgent issue or questions:   Dr. Serita Grit secretary, Lattie Haw can be reached at [phone]   Dr. Vista Lawman secretary, Katharine Look can be reached at [phone]   Dr. Glenford Bayley secretary, Caryl Pina can be reached at [phone]   Dr. Eli Hose secretary, Katharine Look can be reached at [phone]   Call the answering service at [phone] after 4:30 PM, on the weekend or a holiday.  Ask for the physician on call.    Follow up injection:    Please call the office in 2 weeks to answer questions that are required by your insurance company in order to get another injection approved by your insurance.  Once  we obtain the authorization, our scheduler Izora Gala will call to schedule the injection.      For Drs. Silvio Clayman, and Speach please call Rina at [phone]  For Drs.  Everett and Laplante, please call Kimmy at [phone]  If you have not heard within a week from the office after you have answered the questions after your first injection, please call the above number to check on this.    Please refrain from calling the secretaries as the Prior Auth Team is managing the requests for your insurance company.  You can either call the Prior Auth Team or your insurance company.      If you have a scheduled injection and you feel 90% better the day before or the day of the injection, please call Izora Gala at [phone] to cancel.    [facility] SCHEDULED NEXT WEEK

## 2019-07-20 NOTE — Progress Notes (Signed)
Patient arrived ambulatory,denies antibiotics and blood thinners. Has driver to drive him home. Has rod fused between L 5  And L 1.

## 2019-07-20 NOTE — Procedures (Signed)
Date/Time: 07/20/2019  7:45 AM EDT  Facet Joint Procedure(s): Medial Branch Block - lumbar or sacral 1st level - CPT [id number]  Laterality: Right  Anesthetic medications - Right: 5 mL lidocaine hcl 1 %; 3 mL lidocaine 2 %    PROCEDURE PERFORMED: L3 medial branch and L4 medial branch radiofrequency ablation neurotomy.    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    PROCEDURE: After raising a skin wheal with 1% Xylocaine and anesthetizing the subcutaneous tissues, a #18 gauge 15 cm radiofrequency spinal needle with a 10 millimeters curved active tip was placed under fluoroscopic control at the junction of the superior aspect of the L4 transverse process and the lateral aspect of the L4 superior articulating process. The probe was then advanced 2 to 3 millimeters to lie along the path of the L3 medial branch nerve. AP, lateral, and oblique fluoroscopic views demonstrated proper probe positioning. Sensory stimulation was performed at 50 Hz eliciting deep local back discomfort reliably between 0.3 and 0.8 volts without the occurrence of radicular pain. Motor stimulation was then performed at 2 Hz eliciting local multifidus contractions reliably between 0.8 to 1.5 volts without evidence of lower limb myotomal contraction up to 2 to 3 volts. 0.75 millimeters of 2% preservative free Xylocaine was then injected over the L3 medial branch nerve. Two lesions were created by heating the probe to 80 degrees Celsius for 90 seconds first with the curved tip rotated into the groove and a second with the curved tip rotated cephalad along the L4 superior articular process. The impedance ranged between 185 to 275 Ohms.    Next, a similar procedure was performed on the L4 medial branch nerve with the probe placed lateral to the L5 superior articulating process. Sensory stimulation at 50 Hz demonstrated deep local back discomfort reliably between 0.3 and 0.8 volts without radicular pain. Motor  stimulation at 2 Hertz elicited local multifidus contractions reliably between 0.8 to 1.5 volts without evidence of lower limb myotomal contractions up to 2 to 3 volts. 0.75 millimeters of 2% preservative free Xylocaine was then injected over the L4 medial branch nerve. Two lesions were created by heating the probe to 80 degrees Celsius for 90 seconds first with the curved tip rotated into the groove and a second with the curved tip rotated cephalad along the L5 superior articular process. The impedance ranged between 185 to 275 Ohms.    EBL: Less than 42mL    Specimens removed: None.

## 2019-07-24 ENCOUNTER — Other Ambulatory Visit: Payer: Self-pay

## 2019-07-25 ENCOUNTER — Other Ambulatory Visit: Payer: Self-pay | Admitting: Physical Medicine and Rehabilitation

## 2019-07-25 NOTE — Progress Notes (Signed)
[place]  OR SURGICAL PROCEDURE                            Patient Name: [name]  [facility] [address] MR                                                              DOB: 07/03/1976         Please read this form or have someone read it to you.   It's important to understand all parts of this form. If something isn't clear, ask [place] to explain.   When you sign it, that means you understand the form and give [place] permission to do this surgery or procedure.     I agree for Sheppard Evens, DO along with any assistants* they may choose, to treat the following condition(s): Pain   By doing this surgery or procedure on me: Injection into your spine   This is also known as: L3,4 neurotomy   Laterality: Left     *if you'd like a list of the assistants, please ask. We can give that to you.    1. The care provider has explained my condition to me. They have told me how the procedure can help me. They have told me about other ways of treating my condition. I understand the care provider cannot guarantee the result of the procedure. If I don't have this procedure, my other choices are: No injection, physical therapy and medications    2. The care provider has told me the risks (problems that can happen) of the procedure. I understand there may be unwanted results. The risks that are related to this procedure include: Infection, bleeding, nerve damage, spinal cord injury, stroke, death    3. I understand that during the procedure, my care provider may find a condition that we didn't know about before the treatment started. Therefore, I agree that my care provider can perform any other treatment which they think is necessary and available.    4. I understand the care provider may remove tissue, body parts, or materials during this procedure. These materials may be used to help with my diagnosis and treatment. They might also be used for teaching  purposes or for research studies that I have separately agreed to participate in. Otherwise they will be disposed of as required by law.    5. My care provider might want a representative from a [employer] to be there during my procedure. I understand that person works for:          The ways they might help my care provider during my procedure include:            6. Here are my decisions about receiving blood, blood products, or tissues. I understand my decisions cover the time before, during and after my procedure, my treatment, and my time in the hospital. After my procedure, if my condition changes a lot, my care provider will talk with me again about receiving blood or blood products. At that time, my care provider might need me to review and sign another consent form, about getting or refusing blood.  I understand that the blood is from the community blood supply. Volunteers donated the blood, the volunteers were screened for health problems. The blood was examined with very sensitive and accurate tests to look for hepatitis, HIV/AIDS, and other diseases. Before I receive blood, it is tested again to make sure it is the correct type.    My chances of getting a sickness from blood products are small. But no transfusion is 100% safe. I understand that my care provider feels the good I will receive from the blood is greater than the chances of something going wrong. My care provider has answered my questions about blood products.      My decision  about blood or  blood products   Not applicable.        My decision   about tissue  Implants     Not applicable.          I understand this  form.    My care provider  or his/her  assistants have  explained:   What I am having done and why I need it.  What other choices I can make instead of having this done.  The benefits and possible risks (problems) to me of having this done.  The benefits and possible risks (problems) to me of receiving transplants,  blood, or blood products.  There is no guarantee of the results.  The care provider may not stay with me the entire time that I am in the operating or procedure room.  My provider has explained how this may affect my procedure. My provider has answered my  questions about this.         I give my  permission for  this surgery or  procedure.            _______________________________________________                                     My signature  (or parent or other person authorized to sign for you, if you are unable to sign for  yourself or if you are under 42 years old)        ______           Date        _____        Time   Electronic Signatures will display at the bottom of the consent form.    Care provider's statement: I have discussed the planned procedure, including the possibility for transfusion of blood  products or receipt of tissue as necessary; expected benefits; the possible complications and risks; and possible alternatives  and their benefits and risks with the patients or the patient's surrogate. In my opinion, the patient or the patient's surrogate  understands the proposed procedure, its risks, benefits and alternatives.              Electronically signed by: Tamera ReasonBEN L Aneisha Skyles, DO                                                07/25/2019         Date        1:01 PM        Time

## 2019-07-26 ENCOUNTER — Ambulatory Visit: Payer: No Typology Code available for payment source | Admitting: Physical Medicine and Rehabilitation

## 2019-07-26 ENCOUNTER — Ambulatory Visit: Payer: No Typology Code available for payment source | Admitting: Radiology

## 2019-07-26 ENCOUNTER — Other Ambulatory Visit: Payer: Self-pay | Admitting: Physical Medicine and Rehabilitation

## 2019-07-26 DIAGNOSIS — M545 Low back pain, unspecified: Secondary | ICD-10-CM

## 2019-08-03 ENCOUNTER — Ambulatory Visit
Payer: No Typology Code available for payment source | Attending: Physical Medicine and Rehabilitation | Admitting: Physical Medicine and Rehabilitation

## 2019-08-03 ENCOUNTER — Ambulatory Visit
Admission: RE | Admit: 2019-08-03 | Discharge: 2019-08-03 | Disposition: A | Discharge status: Home / Self Care | Payer: No Typology Code available for payment source | Source: Ambulatory Visit | Admitting: Radiology

## 2019-08-03 ENCOUNTER — Encounter: Payer: Self-pay | Admitting: Physical Medicine and Rehabilitation

## 2019-08-03 VITALS — BP 133/99 | HR 81 | Temp 98.6°F | Resp 18 | Ht 69.0 in | Wt 212.0 lb

## 2019-08-03 DIAGNOSIS — M545 Low back pain, unspecified: Secondary | ICD-10-CM

## 2019-08-03 DIAGNOSIS — M47817 Spondylosis without myelopathy or radiculopathy, lumbosacral region: Secondary | ICD-10-CM | POA: Insufficient documentation

## 2019-08-03 MED ORDER — LIDOCAINE HCL 1 % IJ SOLN *I*
5.0000 mL | Freq: Once | INTRAMUSCULAR | Status: AC | PRN
Start: 2019-08-03 — End: 2019-08-03
  Administered 2019-08-03: 5 mL via SUBCUTANEOUS

## 2019-08-03 MED ORDER — LIDOCAINE HCL 2 % IJ SOLN *I*
3.0000 mL | Freq: Once | INTRAMUSCULAR | Status: AC | PRN
Start: 2019-08-03 — End: 2019-08-03
  Administered 2019-08-03: 3 mL

## 2019-08-03 NOTE — Progress Notes (Signed)
Pt has driver; no abx or blood thinners; rods and screws L5-S1

## 2019-08-03 NOTE — Patient Instructions (Signed)
Post Spinal Injection / Procedure Instructions    Instructions:  Notify the office of the doctor who performed your procedure if you experience any of the following:  · Fever of 100 degrees Fahrenheit or 38 degrees Celsius or greater.  · Excessive swelling or redness at the injection site.  · Excessive pain or numbness that lasts longer than 72 hours after your injections.  · Severe headache that goes away when you lie flat and comes back when you sit upright    Activity:  · Rest after your injection for 24 hours, then return to your usual activities.  This includes any restrictions your physician may have prescribed.  · Do not drive the day of your injection.    Care of the injection site:  · You may ice the injection site for local discomfort.  Cover ice with a cloth and apply light pressure.  Never place ice directly on the skin.   Apply ice for 15 minutes on then off for 1 hour.  Repeat as needed.  · Take band aids off the following day.    For diabetic patients:  · Check your blood sugar level every 8 hours for the next 72 hours.  If it is greater than 250, call your primary care physician.    Medications:  · You may continue taking your pre-injection medications as previously prescribed.  · Remember, the steroid you had injected today usually takes 1 to 3 days to take effect, however it can take up to 7 days.    Contacts/Questions:  In case of an urgent issue or questions:  · Dr. Patel's secretary, Lisa can be reached at [phone]  · Dr. Everett's secretary, Sandra can be reached at [phone]  · Dr. Speach's secretary, Ashley can be reached at [phone]  · Dr. Orsini's secretary, Sandra can be reached at [phone]  · Call the answering service at [phone] after 4:30 PM, on the weekend or a holiday.  Ask for the physician on call.    Follow-up Office Visit:  You need to call the appointment center at [phone] for a follow-up office visit with Dr. LaPlante in 6 weeks or  the next available appointment.

## 2019-08-03 NOTE — Procedures (Signed)
Date/Time: 08/03/2019 10:00 AM EDT  Facet Joint Procedure(s): Medial Branch Block - lumbar or sacral 1st level - CPT [id number]  Laterality: Left  Anesthetic medication - Left:  5 mL lidocaine hcl 1 %; 3 mL lidocaine 2 %    PROCEDURE PERFORMED: L3 medial branch and L4 medial branch radiofrequency ablation neurotomy.    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    PROCEDURE: After raising a skin wheal with 1% Xylocaine and anesthetizing the subcutaneous tissues, a #18 gauge 15 cm radiofrequency spinal needle with a 10 millimeters curved active tip was placed under fluoroscopic control at the junction of the superior aspect of the L4 transverse process and the lateral aspect of the L4 superior articulating process. The probe was then advanced 2 to 3 millimeters to lie along the path of the L3 medial branch nerve. AP, lateral, and oblique fluoroscopic views demonstrated proper probe positioning. Sensory stimulation was performed at 50 Hz eliciting deep local back discomfort reliably between 0.3 and 0.8 volts without the occurrence of radicular pain. Motor stimulation was then performed at 2 Hz eliciting local multifidus contractions reliably between 0.8 to 1.5 volts without evidence of lower limb myotomal contraction up to 2 to 3 volts. 0.75 millimeters of 2% preservative free Xylocaine was then injected over the L3 medial branch nerve. Two lesions were created by heating the probe to 80 degrees Celsius for 90 seconds first with the curved tip rotated into the groove and a second with the curved tip rotated cephalad along the L4 superior articular process. The impedance ranged between 185 to 275 Ohms.    Next, a similar procedure was performed on the L4 medial branch nerve with the probe placed lateral to the L5 superior articulating process. Sensory stimulation at 50 Hz demonstrated deep local back discomfort reliably between 0.3 and 0.8 volts without radicular pain. Motor  stimulation at 2 Hertz elicited local multifidus contractions reliably between 0.8 to 1.5 volts without evidence of lower limb myotomal contractions up to 2 to 3 volts. 0.75 millimeters of 2% preservative free Xylocaine was then injected over the L4 medial branch nerve. Two lesions were created by heating the probe to 80 degrees Celsius for 90 seconds first with the curved tip rotated into the groove and a second with the curved tip rotated cephalad along the L5 superior articular process. The impedance ranged between 185 to 275 Ohms.    EBL: Less than 60mL    Specimens removed: None.

## 2019-08-03 NOTE — Progress Notes (Signed)
Grounding pad placed on right posterior thigh. Site benign post-procedure. Equipment # [id number]

## 2019-08-16 ENCOUNTER — Other Ambulatory Visit: Payer: Self-pay

## 2019-08-16 MED ORDER — AMPHETAMINE-DEXTROAMPHETAMINE 15 MG PO CP24 *I*
ORAL_CAPSULE | ORAL | 0 refills | Status: DC
Start: 2019-08-21 — End: 2019-11-22
  Filled 2019-09-11: fill #0
  Filled 2019-09-11: qty 90, 90d supply, fill #0

## 2019-08-16 MED ORDER — TRAZODONE HCL 50 MG PO TABS *I*
ORAL_TABLET | ORAL | 2 refills | Status: DC
Start: 2019-08-16 — End: 2020-09-04
  Filled 2019-08-16: qty 30, 30d supply, fill #0
  Filled 2019-09-11: qty 30, 30d supply, fill #1
  Filled 2019-12-12: qty 30, 30d supply, fill #2

## 2019-08-16 MED ORDER — CLONAZEPAM 0.5 MG PO TABS *I*
0.2500 mg | ORAL_TABLET | Freq: Every day | ORAL | 0 refills | Status: DC
Start: 2019-08-21 — End: 2019-11-22
  Filled 2019-09-11: qty 45, 90d supply, fill #0

## 2019-08-18 ENCOUNTER — Other Ambulatory Visit: Payer: Self-pay

## 2019-08-23 ENCOUNTER — Ambulatory Visit: Payer: No Typology Code available for payment source | Admitting: Primary Care

## 2019-08-23 ENCOUNTER — Encounter: Payer: Self-pay | Admitting: Primary Care

## 2019-08-23 VITALS — BP 114/60 | HR 82 | Temp 98.1°F | Ht 69.0 in | Wt 216.6 lb

## 2019-08-23 DIAGNOSIS — E785 Hyperlipidemia, unspecified: Secondary | ICD-10-CM

## 2019-08-23 DIAGNOSIS — I10 Essential (primary) hypertension: Secondary | ICD-10-CM

## 2019-08-23 DIAGNOSIS — G43909 Migraine, unspecified, not intractable, without status migrainosus: Secondary | ICD-10-CM

## 2019-08-23 DIAGNOSIS — Z23 Encounter for immunization: Secondary | ICD-10-CM

## 2019-08-23 NOTE — NoShare Progress Note (Signed)
Chief Complaint   Patient presents with    Hypertension    Hyperlipidemia    Migraine     Current Outpatient Medications   Medication Sig Dispense Refill    clonazePAM (KLONOPIN) 0.5 MG tablet Take 1/2 tablet (0.25 mg total) by mouth daily Max daily dose: 1/2 tablet 45 tablet 0    amphetamine-dextroamphetamine (ADDERALL XR) 15 MG 24 hr capsule Take 1 capsule (15 mg) by mouth daily in the morning 90 capsule 0    traZODone (DESYREL) 50 MG tablet Take 1 tablet (50 mg) by mouth daily at bedtime as needed for insomnia 30 tablet 2    DULoxetine (CYMBALTA) 30 MG DR capsule Take 1 capsule (30 mg) by mouth daily 30 capsule 2    rosuvastatin (CRESTOR) 10 MG tablet TAKE 1 TABLET (10 MG TOTAL) BY MOUTH DAILY 90 tablet 1    cholecalciferol (VITAMIN D) 2000 UNITS tablet Take 2,000 Units by mouth 2 times daily      Multiple Vitamin (MULTIVITAMIN) TABS Take 1 tablet by mouth daily       No current facility-administered medications for this visit.      Medications reviewed and no changes made      HYPERTENSION MANAGEMENT: Chronic and stable    BP 114/60    Pulse 82    Temp 36.7 C (98.1 F)    Ht 1.753 m (5\' 9" )    Wt 98.2 kg (216 lb 9.6 oz)    SpO2 98%    BMI 31.99 kg/m         PATIENT DENIES: [x]  Headaches. [x]  Chest Pain. [x]  Palpitations. [x]  Shortness of Breath.      HABITS:   --Sodium-   [] Yes  [x] No          --Exercise-  [x] Yes resumed biking to and from work the past 3 weeks  [] No          --Smoking-  [] Yes  [x] No          --Caffeine Use- [x] Yes 2 cups coffee/d  [] No             HOME BLOOD PRESSURE: not taking    CHOLESTEROL MANAGEMENT: chronic and stable       DIET:       --Eggs:   [x] Yes 2/week  [] No          --Fast Food:  [x] Yes less pizza  [] No          --Junk Food:  [x] Yes rare  [] No           --Milk:   no        --Butter:   none       --Fiber:   [x] Yes usually 2-3 servings a day  [] No        MEDICATIONS:        Taking Crestor    He has chronic migraines which have not occurred in ~1 year. Triggers:  Stress/Bright Lights/Excess caffeine/Fatigue. Has uses Imitrex in the past.  Sleeping in a quiet room usually help.  Of note, his psychiatrist recently stopped his Lexapro and put him on Cymbalta for anxiety/depression as well as better pain control of his lumbar stenosis    EXAM:    AFFECT: Pleasant and cheerful in no acute distress  HEART: Regular rate and rhythm, no murmurs gallops or rubs.  LUNGS: Clear to auscultation bilaterally.     ASSESSMENT/PLAN:    1.  WUJ:[id number] well-controlled and at goal of less than 140/90.  Diet and  exercise reviewed.  Currently on no medicine.  Check SMA-8  2.  Hyperlipidemia: Patient's last profile was at goal on Crestor.  Diet and exercise reviewed.  Check lipid profile/LFTs  3.  Migraine headaches: Doing very well over the past year with no migraines.  He is doing a good job trying to avoid/modify triggers.  He has used Imitrex in the past and will call me if he needs a refill.  He also used to see Dr.Mann and Dr Shea Evansunn who are now retired.  4.  Health maintenance: Flu shot given    RETURN: 6 months HTN/cholesterol/migraines

## 2019-08-30 ENCOUNTER — Other Ambulatory Visit: Payer: Self-pay

## 2019-08-30 MED ORDER — DULOXETINE HCL 60 MG PO CPEP *I*
DELAYED_RELEASE_CAPSULE | ORAL | 2 refills | Status: DC
Start: 2019-08-30 — End: 2019-11-29
  Filled 2019-08-30: qty 30, 30d supply, fill #0
  Filled 2019-10-11: qty 30, 30d supply, fill #1
  Filled 2019-11-14: qty 30, 30d supply, fill #2

## 2019-09-11 ENCOUNTER — Other Ambulatory Visit: Payer: Self-pay

## 2019-09-11 ENCOUNTER — Other Ambulatory Visit: Payer: Self-pay | Admitting: Primary Care

## 2019-09-11 MED ORDER — ROSUVASTATIN CALCIUM 10 MG PO TABS *I*
10.0000 mg | ORAL_TABLET | Freq: Every day | ORAL | 1 refills | Status: DC
Start: 2019-09-11 — End: 2020-01-12
  Filled 2019-09-11: qty 90, 90d supply, fill #0
  Filled 2019-12-12: qty 90, 90d supply, fill #1

## 2019-09-12 ENCOUNTER — Other Ambulatory Visit: Payer: Self-pay

## 2019-09-14 ENCOUNTER — Ambulatory Visit: Payer: No Typology Code available for payment source | Admitting: Physical Medicine and Rehabilitation

## 2019-09-22 ENCOUNTER — Encounter: Payer: Self-pay | Admitting: Physical Medicine and Rehabilitation

## 2019-09-22 ENCOUNTER — Ambulatory Visit
Payer: No Typology Code available for payment source | Attending: Physical Medicine and Rehabilitation | Admitting: Physical Medicine and Rehabilitation

## 2019-09-22 DIAGNOSIS — M47817 Spondylosis without myelopathy or radiculopathy, lumbosacral region: Secondary | ICD-10-CM | POA: Insufficient documentation

## 2019-09-22 NOTE — Progress Notes (Signed)
History of Present Illness  No chief complaint on file.      HPI   This is a recurrent problem. Episode onset: 12 years with worsening over the past 3 years. The problem occurs constantly. The problem has improved. Pain location: midline and left paramidline lumbosacral region, b/l anterior and posterior thighs. The quality of the pain is described as aching (deep). The pain is at a severity of 1/10. The symptoms are aggravated by standing (walking. Mitigated by lying down). Treatments tried: L5S1 fusion 1999 (Dr. Deno Etienne) with relief, unspecified ESI's 2008 with initial relief, multiple sessions of physical therapy including 3-4 years without relief, chiropractic treatment 2008 without relief. Improvement on treatment: current pain meds include duloxetine; prior pain meds include meloxicam and gabapentin. He reports 80% relief s/p a b/l L3,4 medial branch neurotomy. He has been able to discontinue gabapentin. He was able to walk 9 holes of golf (carrying bag) and play paddle tennis without limitations.       Review of Systems   Constitutional: Negative.    Gastrointestinal: Negative.    Genitourinary: Negative.         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    Vertigo     Steroid injection into right ear 12/17/2011     Social History     Socioeconomic History    Marital status: Married     Spouse name: Not on file    Number of children: 2    Years of education: Not on file    Highest education level: Not on file   Tobacco Use    Smoking status: Former Smoker     Packs/day: 1.00     Years: 6.00     Pack years: 6.00     Types: Cigarettes      Quit date: 12/07/2005     Years since quitting: 13.8    Smokeless tobacco: Never Used    Tobacco comment: Quit 2007   Substance and Sexual Activity    Alcohol use: Yes     Alcohol/week: 4.0 standard drinks     Types: 4 Standard drinks or equivalent per week     Comment: social    Drug use: Yes     Types: Marijuana    Sexual activity: Yes     Partners: Female     Birth control/protection: Condom   Other Topics Concern    Not on file   Social History Narrative    Not on file     Family History   Problem Relation Age of Onset    High cholesterol Father     Hypertension Father     Cancer Father     High Blood Pressure Father     Conversion Other         [id number] Health Status^^Active^Father(A)HTN.Chol.Allergies Mother(A)Migraines 1 Brother(A)ADD 2 Sisters(A) 1 Daughter(A) PatGF(A)CAD.Glaucoma  DM2.Chol PatGM(D)49.MI MatGF(D)60s.Bone Ca MatGM(A)    Hearing loss Maternal Grandmother     Cancer Maternal Grandfather           I have reviewed  the patient's medications and allergies and updated them as appropriate in the electronic medical record.       Objective:     Today's vitals:  temperature is 36.4 C (97.5 F). His blood pressure is 146/91 (abnormal) and his pulse is 80.      Physical Exam   Constitutional: He appears well-developed.   Pulmonary/Chest: Effort normal.   Neurological: He has normal strength. Gait normal.     Neurologic Exam     Mental Status   Level of consciousness: alert    Motor Exam     Strength   Strength 5/5 throughout.     Sensory Exam   Right leg light touch: normal  Left leg light touch: normal    Gait, Coordination, and Reflexes     Gait  Gait: normal                 MRI L/S spine 05/30/19    Posterior fusion hardware and pedicle screws L5S1.  75% disc height loss with type I modic changes L45.  Moderate desiccation with <25% disc height loss L23 and L34.    Assessment/Plan       Lumbosacral spondylosis without myelopathy  1. Axial low back pain c/w painful facet joint  arthrosis L45 vs IDD L45 s/p a b/l L3,4 medial branch neurotomy.  2. H/o L5S1 fusion.    1. Continue current analgesic regimen.  2. Continue home exercise program.  3. If symptoms recur, will reassess b/l L45 facet joints and if positive x 1, neurotomy.  4. F/u prn.                  Follow up  recommended: Return if symptoms worsen or fail to improve.     Patient verbalized understanding of the above instructions.     Current Outpatient Medications   Medication    rosuvastatin (CRESTOR) 10 MG tablet    DULoxetine (CYMBALTA) 60 MG DR capsule    clonazePAM (KLONOPIN) 0.5 MG tablet    amphetamine-dextroamphetamine (ADDERALL XR) 15 MG 24 hr capsule    traZODone (DESYREL) 50 MG tablet    cholecalciferol (VITAMIN D) 2000 UNITS tablet    Multiple Vitamin (MULTIVITAMIN) TABS     No current facility-administered medications for this visit.        Nathaneal Sommers L Broderic Bara, DO  09/22/2019     Amirr Achord L Briggett Tuccillo, DO  Electronically signed on 09/22/2019 at 8:54 AM.

## 2019-09-22 NOTE — Assessment & Plan Note (Addendum)
1. Axial low back pain c/w painful facet joint arthrosis L45 vs IDD L45 s/p a b/l L3,4 medial branch neurotomy.  2. H/o L5S1 fusion.    1. Continue current analgesic regimen.  2. Continue home exercise program.  3. If symptoms recur, will reassess b/l L45 facet joints and if positive x 1, neurotomy.  4. F/u prn.

## 2019-10-11 ENCOUNTER — Other Ambulatory Visit: Payer: Self-pay

## 2019-10-13 ENCOUNTER — Other Ambulatory Visit: Payer: Self-pay

## 2019-10-18 ENCOUNTER — Other Ambulatory Visit: Payer: Self-pay

## 2019-10-18 MED ORDER — BUPROPION HCL 100 MG PO TB12 *I*
ORAL_TABLET | ORAL | 0 refills | Status: DC
Start: 2019-10-18 — End: 2019-11-14
  Filled 2019-10-18: qty 30, 30d supply, fill #0

## 2019-10-19 ENCOUNTER — Other Ambulatory Visit: Payer: Self-pay

## 2019-11-14 ENCOUNTER — Other Ambulatory Visit: Payer: Self-pay

## 2019-11-14 MED ORDER — BUPROPION HCL 100 MG PO TB12 *I*
ORAL_TABLET | ORAL | 0 refills | Status: DC
Start: 2019-11-14 — End: 2019-12-12
  Filled 2019-11-14: qty 30, 30d supply, fill #0

## 2019-11-15 ENCOUNTER — Other Ambulatory Visit: Payer: Self-pay

## 2019-11-16 ENCOUNTER — Other Ambulatory Visit: Payer: Self-pay

## 2019-11-22 ENCOUNTER — Other Ambulatory Visit: Payer: Self-pay

## 2019-11-22 MED ORDER — AMPHETAMINE-DEXTROAMPHETAMINE 15 MG PO CP24 *I*
15.0000 mg | ORAL_CAPSULE | Freq: Every morning | ORAL | 0 refills | Status: DC
Start: 2019-11-22 — End: 2020-02-14
  Filled 2019-11-22: qty 90, 90d supply, fill #0

## 2019-11-22 MED ORDER — CLONAZEPAM 0.5 MG PO TABS *I*
0.2500 mg | ORAL_TABLET | Freq: Every day | ORAL | 0 refills | Status: DC
Start: 2019-11-22 — End: 2020-02-14
  Filled 2019-11-22: qty 45, 90d supply, fill #0

## 2019-11-29 ENCOUNTER — Other Ambulatory Visit: Payer: Self-pay

## 2019-11-29 MED ORDER — DULOXETINE HCL 60 MG PO CPEP *I*
DELAYED_RELEASE_CAPSULE | ORAL | 2 refills | Status: DC
Start: 2019-11-29 — End: 2020-02-29
  Filled 2019-11-29: qty 30, 30d supply, fill #0
  Filled 2020-01-12: qty 30, 30d supply, fill #1
  Filled 2020-02-24: qty 30, 30d supply, fill #2

## 2019-12-05 ENCOUNTER — Other Ambulatory Visit: Payer: Self-pay

## 2019-12-06 ENCOUNTER — Other Ambulatory Visit: Payer: Self-pay

## 2019-12-11 ENCOUNTER — Other Ambulatory Visit: Payer: Self-pay

## 2019-12-12 ENCOUNTER — Other Ambulatory Visit: Payer: Self-pay

## 2019-12-12 MED ORDER — BUPROPION HCL 100 MG PO TB12 *I*
ORAL_TABLET | ORAL | 2 refills | Status: DC
Start: 2019-12-12 — End: 2020-02-28
  Filled 2019-12-12: qty 30, 30d supply, fill #0
  Filled 2020-01-12: qty 30, 30d supply, fill #1

## 2019-12-13 ENCOUNTER — Other Ambulatory Visit: Payer: Self-pay

## 2019-12-15 ENCOUNTER — Other Ambulatory Visit: Payer: Self-pay

## 2020-01-12 ENCOUNTER — Other Ambulatory Visit: Payer: Self-pay

## 2020-01-12 ENCOUNTER — Other Ambulatory Visit: Payer: Self-pay | Admitting: Primary Care

## 2020-01-12 MED ORDER — ROSUVASTATIN CALCIUM 10 MG PO TABS *I*
10.0000 mg | ORAL_TABLET | Freq: Every day | ORAL | 1 refills | Status: DC
Start: 2020-01-12 — End: 2020-09-13
  Filled 2020-01-12 – 2020-02-24 (×2): qty 90, 90d supply, fill #0
  Filled 2020-04-26 – 2020-06-11 (×4): qty 90, 90d supply, fill #1

## 2020-01-18 ENCOUNTER — Other Ambulatory Visit: Payer: Self-pay

## 2020-01-24 ENCOUNTER — Other Ambulatory Visit: Payer: Self-pay

## 2020-01-24 MED ORDER — BUPROPION HCL 150 MG PO TB12 *I*
ORAL_TABLET | ORAL | 2 refills | Status: DC
Start: 2020-01-24 — End: 2020-04-10
  Filled 2020-01-24: qty 30, 30d supply, fill #0
  Filled 2020-02-24: qty 30, 30d supply, fill #1
  Filled 2020-03-27: qty 30, 30d supply, fill #2

## 2020-02-02 ENCOUNTER — Other Ambulatory Visit: Payer: Self-pay

## 2020-02-02 ENCOUNTER — Other Ambulatory Visit: Payer: Self-pay | Admitting: Primary Care

## 2020-02-02 NOTE — Telephone Encounter (Signed)
Patient called stating he has sore throat and a runny nose. Patient states his two older children are going to be seen by their pediatrician today due to having a sore throat, headache, and stomachache. Patient also states their nanny was recently diagnosised with strep throat and sent home. Patient states his wife currently has no symptoms. He received both of his COVID-19 vaccines, receiving his 2nd vaccine on 01/13/2020. Patient states if the pediatrician feels strongly that the children have COVID-19 or strep throat then he will go to a [facility] and be evaluated.

## 2020-02-14 ENCOUNTER — Other Ambulatory Visit: Payer: Self-pay

## 2020-02-14 MED ORDER — CLONAZEPAM 0.5 MG PO TABS *I*
ORAL_TABLET | ORAL | 0 refills | Status: DC
Start: 2020-02-14 — End: 2020-09-04
  Filled 2020-02-24: qty 45, fill #0
  Filled 2020-03-11: qty 45, 90d supply, fill #0

## 2020-02-14 MED ORDER — AMPHETAMINE-DEXTROAMPHETAMINE 15 MG PO CP24 *I*
ORAL_CAPSULE | ORAL | 0 refills | Status: DC
Start: 2020-02-14 — End: 2020-09-04
  Filled 2020-02-14: qty 90, 90d supply, fill #0
  Filled 2020-02-24: qty 90, fill #0
  Filled 2020-03-11: qty 90, 90d supply, fill #0

## 2020-02-24 ENCOUNTER — Other Ambulatory Visit: Payer: Self-pay

## 2020-02-26 ENCOUNTER — Other Ambulatory Visit: Payer: Self-pay

## 2020-02-28 ENCOUNTER — Ambulatory Visit: Payer: No Typology Code available for payment source | Admitting: Primary Care

## 2020-02-28 ENCOUNTER — Encounter: Payer: Self-pay | Admitting: Primary Care

## 2020-02-28 VITALS — BP 120/72 | HR 70 | Temp 96.9°F | Ht 69.0 in | Wt 221.0 lb

## 2020-02-28 DIAGNOSIS — I10 Essential (primary) hypertension: Secondary | ICD-10-CM

## 2020-02-28 DIAGNOSIS — E785 Hyperlipidemia, unspecified: Secondary | ICD-10-CM

## 2020-02-28 DIAGNOSIS — G43909 Migraine, unspecified, not intractable, without status migrainosus: Secondary | ICD-10-CM

## 2020-02-28 NOTE — Progress Notes (Signed)
Chief Complaint   Patient presents with    Hypertension    Hyperlipidemia    Migraine     Current Outpatient Medications   Medication Sig Dispense Refill    clonazePAM (KLONOPIN) 0.5 MG tablet Take 1/2 tablet (0.25 mg total) by mouth daily. Maximum 1/2 tablet in 24 hours. 45 tablet 0    amphetamine-dextroamphetamine (ADDERALL XR) 15 MG 24 hr capsule Take 1 capsule (15 mg) by mouth daily in the morning 90 capsule 0    buPROPion (WELLBUTRIN SR) 150 MG 12 hr tablet Take 1 tablet (150 mg) by mouth daily in the morning 30 tablet 2    rosuvastatin (CRESTOR) 10 MG tablet Take 1 tablet (10 mg total) by mouth daily 90 tablet 1    DULoxetine (CYMBALTA) 60 MG DR capsule Take 1 capsule (60 mg) by mouth daily 30 capsule 2    traZODone (DESYREL) 50 MG tablet Take 1 tablet (50 mg) by mouth daily at bedtime as needed for insomnia 30 tablet 2    cholecalciferol (VITAMIN D) 2000 UNITS tablet Take 2,000 Units by mouth 2 times daily      Multiple Vitamin (MULTIVITAMIN) TABS Take 1 tablet by mouth daily      buPROPion (WELLBUTRIN SR) 100 MG 12 hr tablet Take 1 tablet (100 mg) by mouth daily in the morning (Patient not taking: Reported on 02/28/2020) 30 tablet 2     No current facility-administered medications for this visit.     Medications reviewed and no changes made    HYPERTENSION MANAGEMENT:  Chronic and Stable    He gained 16 lbs     Vitals:    02/28/20 0729   BP: 134/86   Pulse: 70   Temp: 36.1 C (96.9 F)   Weight: 100.2 kg (221 lb)   Height: 1.753 m (5\' 9" )     Vitals:    02/28/20 0747   BP: 120/72   Pulse:    Temp:    Weight:    Height:         PATIENT DENIES: [x]  Headaches. [x]  Chest Pain. [x]  Palpitations. [x]  Shortness of Breath.    SYMPTOMS:  has not had a migraine in >6 months (stress/lack of sleep are triggers). He sleeps ~8 hours    HABITS:   --Sodium-   [] Yes  [x] No          --Exercise-  [x] Yes sporadic Peloton/Stairmaster and walks with wife           --Smoking-  [] Yes  [x] No          --Caffeine  Use- [x] Yes 2 cups of black coffee daily               CHOLESTEROL MANAGEMENT: Chronic and Stable      DIET:       --Eggs:   [x] Yes 2 a week            --Fast Food:  [x] Yes 1-2 slices pizza/week            --Junk Food:  [x] Yes rare for celebrations             --Milk:   none        --Butter:   none       --Fiber:   [x] Yes ~2-3 servings fruit/veges daily          MEDICATIONS:  On Crestor    EXAM:    HEART: Regular rate and rhythm, no murmurs gallops or rubs.  LUNGS: Clear to auscultation  bilaterally.    ASSESSMENT/PLAN:    1. HTN:  Presently well-controlled and at goal of less than 140/90.  Diet and exercise reviewed. On no meds. Check SMA-8  2. Hyperlipidemia: stable and at goal on Crestor. Diet/exercise reviewed. Check FLP/LFTs  3. Migraines: not active in the past 6 months. He is getting enough sleep and will try to exercise more for stress relief. He also has been playing paddle tennis and will start golfing (belongs to [facility])    RETURN: 6 months HTN/Cholesterol/Migraines

## 2020-02-29 ENCOUNTER — Other Ambulatory Visit: Payer: Self-pay

## 2020-02-29 MED ORDER — DULOXETINE HCL 60 MG PO CPEP *I*
DELAYED_RELEASE_CAPSULE | ORAL | 2 refills | Status: DC
Start: 2020-02-29 — End: 2020-09-04
  Filled 2020-02-29 – 2020-03-27 (×2): qty 30, 30d supply, fill #0
  Filled 2020-04-26: qty 30, 30d supply, fill #1
  Filled 2020-05-31 – 2020-07-01 (×2): qty 30, 30d supply, fill #2

## 2020-03-11 ENCOUNTER — Other Ambulatory Visit: Payer: Self-pay

## 2020-03-22 ENCOUNTER — Other Ambulatory Visit: Payer: Self-pay

## 2020-03-27 ENCOUNTER — Other Ambulatory Visit: Payer: Self-pay

## 2020-04-09 ENCOUNTER — Other Ambulatory Visit
Admission: RE | Admit: 2020-04-09 | Discharge: 2020-04-09 | Disposition: A | Discharge status: Home / Self Care | Payer: No Typology Code available for payment source | Source: Ambulatory Visit | Attending: Primary Care | Admitting: Primary Care

## 2020-04-09 DIAGNOSIS — E785 Hyperlipidemia, unspecified: Secondary | ICD-10-CM | POA: Insufficient documentation

## 2020-04-09 DIAGNOSIS — I10 Essential (primary) hypertension: Secondary | ICD-10-CM | POA: Insufficient documentation

## 2020-04-09 LAB — AST: AST: 30 U/L (ref 0–50)

## 2020-04-09 LAB — LIPID PANEL
Chol/HDL Ratio: 3.8
Cholesterol: 188 mg/dL
HDL: 49 mg/dL (ref 40–60)
LDL Calculated: 85 mg/dL
Non HDL Cholesterol: 139 mg/dL
Triglycerides: 268 mg/dL — AB

## 2020-04-09 LAB — BASIC METABOLIC PANEL
Anion Gap: 12 (ref 7–16)
CO2: 27 mmol/L (ref 20–28)
Calcium: 9.9 mg/dL (ref 9.0–10.3)
Chloride: 103 mmol/L (ref 96–108)
Creatinine: 1.17 mg/dL (ref 0.67–1.17)
GFR,Black: 88 *
GFR,Caucasian: 76 *
Glucose: 104 mg/dL — ABNORMAL HIGH (ref 60–99)
Lab: 20 mg/dL (ref 6–20)
Potassium: 4.7 mmol/L (ref 3.3–5.1)
Sodium: 142 mmol/L (ref 133–145)

## 2020-04-09 LAB — ALT: ALT: 37 U/L (ref 0–50)

## 2020-04-10 ENCOUNTER — Other Ambulatory Visit: Payer: Self-pay

## 2020-04-10 MED ORDER — BUPROPION HCL 150 MG PO TB12 *I*
ORAL_TABLET | ORAL | 2 refills | Status: DC
Start: 2020-04-10 — End: 2020-07-10
  Filled 2020-04-10 – 2020-04-26 (×2): qty 30, 30d supply, fill #0
  Filled 2020-05-31: qty 30, 30d supply, fill #1
  Filled 2020-07-01: qty 30, 30d supply, fill #2

## 2020-04-12 ENCOUNTER — Other Ambulatory Visit: Payer: Self-pay

## 2020-04-26 ENCOUNTER — Other Ambulatory Visit: Payer: Self-pay

## 2020-04-29 ENCOUNTER — Other Ambulatory Visit: Payer: Self-pay

## 2020-05-10 ENCOUNTER — Other Ambulatory Visit: Payer: Self-pay

## 2020-05-13 ENCOUNTER — Other Ambulatory Visit: Payer: Self-pay

## 2020-05-14 ENCOUNTER — Other Ambulatory Visit: Payer: Self-pay

## 2020-05-15 ENCOUNTER — Other Ambulatory Visit: Payer: Self-pay

## 2020-05-29 ENCOUNTER — Other Ambulatory Visit: Payer: Self-pay

## 2020-05-29 MED ORDER — DULOXETINE HCL 60 MG PO CPEP *I*
DELAYED_RELEASE_CAPSULE | ORAL | 2 refills | Status: DC
Start: 2020-05-29 — End: 2020-09-04
  Filled 2020-05-29: qty 30, 30d supply, fill #0
  Filled 2020-08-02: qty 30, 30d supply, fill #1

## 2020-05-31 ENCOUNTER — Other Ambulatory Visit: Payer: Self-pay

## 2020-06-04 ENCOUNTER — Other Ambulatory Visit: Payer: Self-pay

## 2020-06-07 ENCOUNTER — Other Ambulatory Visit: Payer: Self-pay

## 2020-06-07 ENCOUNTER — Other Ambulatory Visit: Payer: Self-pay | Admitting: Primary Care

## 2020-06-08 ENCOUNTER — Other Ambulatory Visit: Payer: Self-pay

## 2020-06-11 ENCOUNTER — Other Ambulatory Visit: Payer: Self-pay

## 2020-06-12 ENCOUNTER — Other Ambulatory Visit: Payer: Self-pay

## 2020-06-12 MED ORDER — AMPHETAMINE-DEXTROAMPHETAMINE 15 MG PO CP24 *I*
ORAL_CAPSULE | ORAL | 0 refills | Status: DC
Start: 2020-06-14 — End: 2020-12-10
  Filled 2020-06-18: qty 90, 90d supply, fill #0

## 2020-06-12 MED ORDER — CLONAZEPAM 0.5 MG PO TABS *I*
ORAL_TABLET | ORAL | 0 refills | Status: DC
Start: 2020-06-14 — End: 2020-09-11
  Filled 2020-06-18: qty 45, 90d supply, fill #0

## 2020-06-18 ENCOUNTER — Other Ambulatory Visit: Payer: Self-pay

## 2020-06-24 ENCOUNTER — Other Ambulatory Visit: Payer: Self-pay

## 2020-07-01 ENCOUNTER — Other Ambulatory Visit: Payer: Self-pay

## 2020-07-03 ENCOUNTER — Other Ambulatory Visit: Payer: Self-pay

## 2020-07-10 ENCOUNTER — Other Ambulatory Visit: Payer: Self-pay

## 2020-07-10 MED ORDER — BUPROPION HCL 150 MG PO TB12 *I*
ORAL_TABLET | ORAL | 2 refills | Status: DC
Start: 2020-07-10 — End: 2020-12-10
  Filled 2020-07-10: qty 30, 30d supply, fill #0
  Filled 2020-12-03: qty 30, 30d supply, fill #1

## 2020-07-17 ENCOUNTER — Other Ambulatory Visit: Payer: Self-pay

## 2020-07-19 ENCOUNTER — Other Ambulatory Visit: Payer: Self-pay

## 2020-07-23 ENCOUNTER — Other Ambulatory Visit: Payer: Self-pay

## 2020-08-02 ENCOUNTER — Other Ambulatory Visit: Payer: Self-pay

## 2020-08-21 ENCOUNTER — Other Ambulatory Visit: Payer: Self-pay

## 2020-08-21 MED ORDER — DULOXETINE HCL 60 MG PO CPEP *I*
DELAYED_RELEASE_CAPSULE | ORAL | 0 refills | Status: DC
Start: 2020-08-21 — End: 2020-09-04
  Filled 2020-08-21: qty 90, 90d supply, fill #0

## 2020-08-22 ENCOUNTER — Other Ambulatory Visit: Payer: Self-pay

## 2020-08-24 ENCOUNTER — Other Ambulatory Visit: Payer: Self-pay

## 2020-09-02 ENCOUNTER — Other Ambulatory Visit: Payer: Self-pay

## 2020-09-04 ENCOUNTER — Other Ambulatory Visit: Payer: Self-pay

## 2020-09-04 ENCOUNTER — Encounter: Payer: Self-pay | Admitting: Primary Care

## 2020-09-04 ENCOUNTER — Ambulatory Visit: Payer: No Typology Code available for payment source | Admitting: Primary Care

## 2020-09-04 VITALS — BP 118/80 | HR 82 | Temp 97.2°F | Ht 69.0 in | Wt 216.0 lb

## 2020-09-04 DIAGNOSIS — R7303 Prediabetes: Secondary | ICD-10-CM

## 2020-09-04 DIAGNOSIS — G43909 Migraine, unspecified, not intractable, without status migrainosus: Secondary | ICD-10-CM

## 2020-09-04 DIAGNOSIS — E785 Hyperlipidemia, unspecified: Secondary | ICD-10-CM

## 2020-09-04 DIAGNOSIS — I10 Essential (primary) hypertension: Secondary | ICD-10-CM

## 2020-09-04 DIAGNOSIS — Z23 Encounter for immunization: Secondary | ICD-10-CM

## 2020-09-04 MED ORDER — DULOXETINE HCL 60 MG PO CPEP *I*
60.0000 mg | DELAYED_RELEASE_CAPSULE | Freq: Every day | ORAL | 0 refills | Status: DC
Start: 2020-09-04 — End: 2020-12-10
  Filled 2020-09-04: qty 90, fill #0
  Filled 2020-09-10 – 2020-12-03 (×2): qty 90, 90d supply, fill #0

## 2020-09-04 NOTE — Progress Notes (Signed)
Chief Complaint   Patient presents with    Hypertension    Hyperlipidemia    Migraine    Hyperglycemia     Current Outpatient Medications   Medication Sig Dispense Refill    DULoxetine (CYMBALTA) 60 MG DR capsule Take 1 capsule (60 mg) by mouth daily 90 capsule 0    buPROPion (WELLBUTRIN SR) 150 MG 12 hr tablet Take 1 tablet (150 mg) by mouth daily in the morning 30 tablet 2    amphetamine-dextroamphetamine (ADDERALL XR) 15 MG 24 hr capsule Take 1 capsule (15 mg) by mouth daily in the morning.  Maximum 1 capsule per day. 90 capsule 0    clonazePAM (KLONOPIN) 0.5 MG tablet Take 1/2  tablet (0.25 mg) by mouth daily.  Maximum 1/2 tablet per day. 45 tablet 0    rosuvastatin (CRESTOR) 10 MG tablet Take 1 tablet (10 mg total) by mouth daily 90 tablet 1    cholecalciferol (VITAMIN D) 2000 UNITS tablet Take 2,000 Units by mouth 2 times daily      Multiple Vitamin (MULTIVITAMIN) TABS Take 1 tablet by mouth daily       No current facility-administered medications for this visit.     Medications reviewed and no changes made    HYPERTENSION MANAGEMENT:  Chronic and Stable    BP 118/80    Pulse 82    Temp 36.2 C (97.2 F)    Ht 1.753 m (5\' 9" )    Wt 98 kg (216 lb)    SpO2 96%    BMI 31.90 kg/m         PATIENT DENIES:  [x]  Chest Pain. [x]  Palpitations. [x]  Shortness of Breath.    SYMPTOMS: 2 migraines a year-TRIGGERS: Stress/Fatigue/Bright Lights/excess caffeine    HABITS:   --Sodium-   [] Yes  [x] No          --Exercise-  [x] Yes has a Peloton/Stairstepper but not using, walks when he golfs, plays paddle  [] No          --Smoking-  [] Yes  [x] No          --Caffeine Use- [x] Yes 2 cups coffee/d               HOME BLOOD PRESSURE: sporadic but normal    CHOLESTEROL/PreDM MANAGEMENT: Chronic and Stable. Lost 5 lbs by cutting back on alcohol      DIET:       --Eggs:   [x] Yes 0-2/week           --Fast Food:  [] Yes   [x] No but makes home made pizza once a week         --Junk Food:  [x] Yes rare cookies, occasional ice cream           --Milk:   no        --Butter:   no       --Fiber:   [x] Yes 3 servings fruit/veges a day        EXAM:    HEART: Regular rate and rhythm, no murmurs gallops or rubs.  LUNGS: Clear to auscultation bilaterally.    ASSESSMENT/PLAN:    1. HTN: Presently well-controlled and at goal of less than 140/90.  Diet and exercise reviewed.  Currently on no medicine  2. Hyperlipidemia: His last profile was at goal except for his triglycerides were greater than 150.  He is currently on Crestor.  Diet and exercise reviewed.  Check lipid profile/LFTs  3. PreDM: Currently on no medicine.  Diet and exercise reviewed.  Check fasting blood sugar/A1c  4. Migraines: Mild and infrequent.  Continue trying to control triggers.  In the past, he did use a triptan but it did not help  5. Health Maintenance: Flu shot given. He will get his Covid-19 booster in the near future    Return: 6 months HTN/Choletserol

## 2020-09-05 ENCOUNTER — Other Ambulatory Visit: Payer: Self-pay

## 2020-09-09 ENCOUNTER — Other Ambulatory Visit: Payer: Self-pay

## 2020-09-10 ENCOUNTER — Other Ambulatory Visit: Payer: Self-pay

## 2020-09-11 ENCOUNTER — Other Ambulatory Visit: Payer: Self-pay

## 2020-09-11 IMAGING — CT CHESTW
2 series · 14 of 27 positions shown, 18 images · IV contrast (agent unspecified)
Comparison: There are no prior studies.

Procedure(s): CT chest w con

EXAM:
CT chest with contrast
CT abdomen and pelvis with contrast
INDICATION: 43-year-old male status post fall.
A horse fell on top of him.
TECHNIQUE: The patient was given 100 mL 1mnipaque-ITU contrast.
Dose reduction/FATIMA DA SILVA techniques was utilized
DETAILED FINDINGS:

[Series 4: chest w_ · axial · 0.83mm/px · z∈[-716,-156]mm · 9 of 134 slices shown, 12 images]
[im 11/134  soft-tissue]
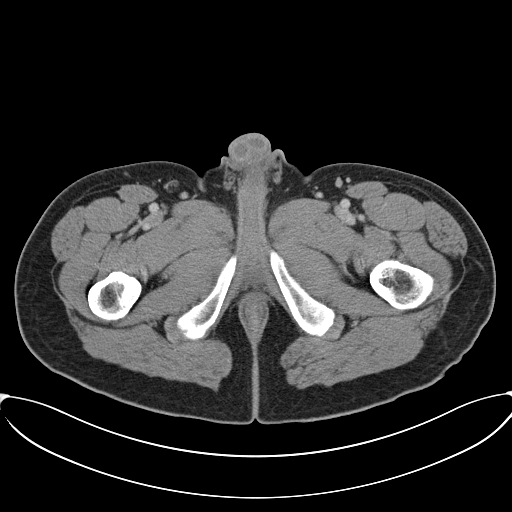
[im 11/134  bone]
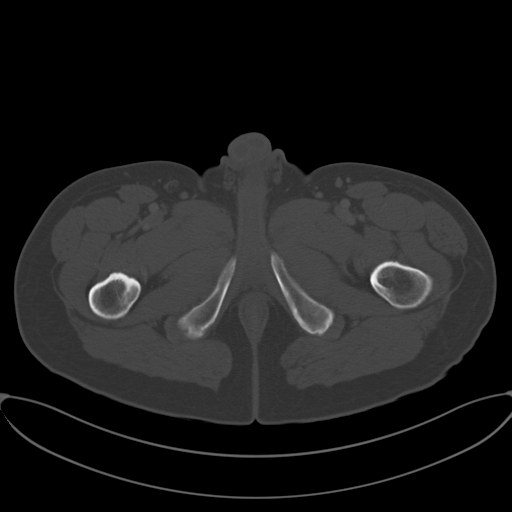
[im 31/134  bone]
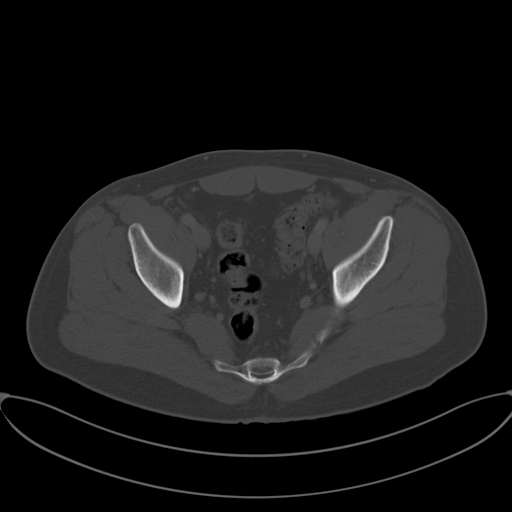
[im 41/134  bone]
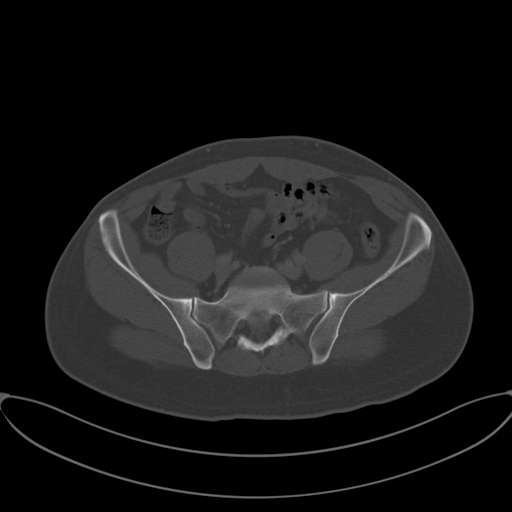
[im 52/134  bone]
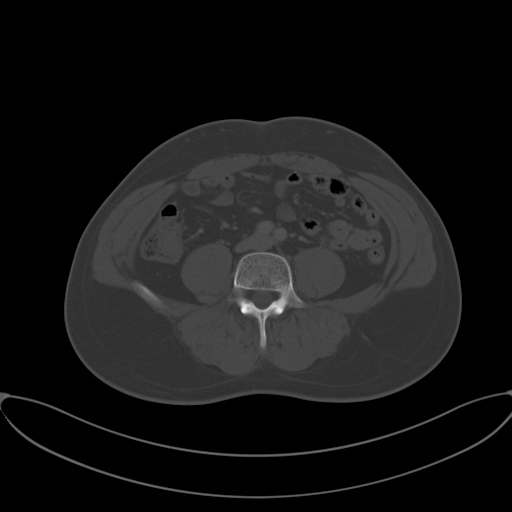
[im 72/134  soft-tissue]
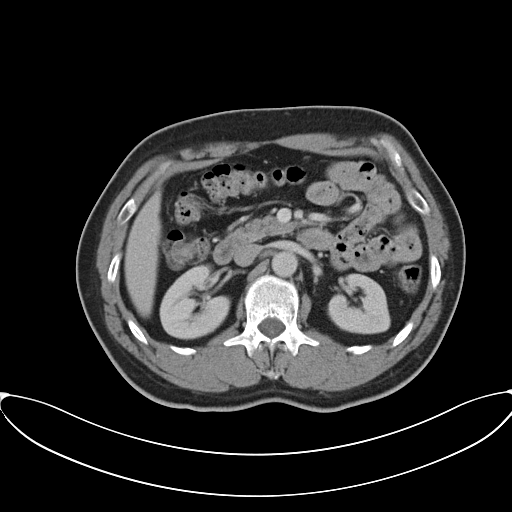
[im 72/134  bone]
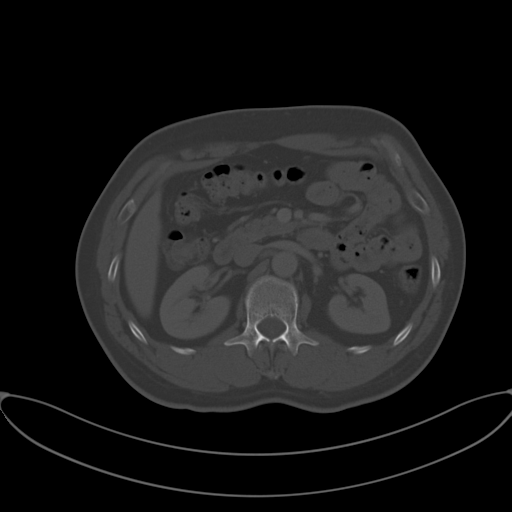
[im 82/134  bone]
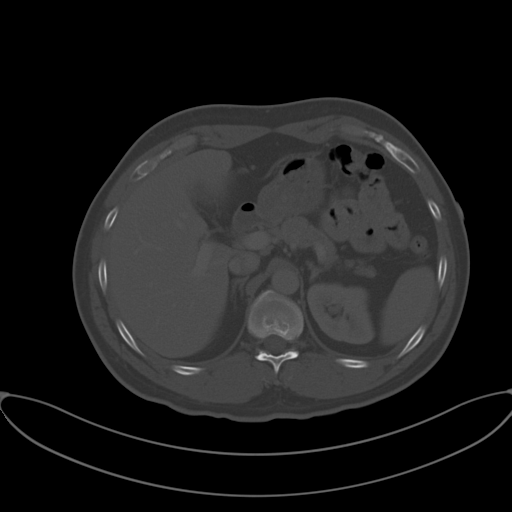
[im 93/134  bone]
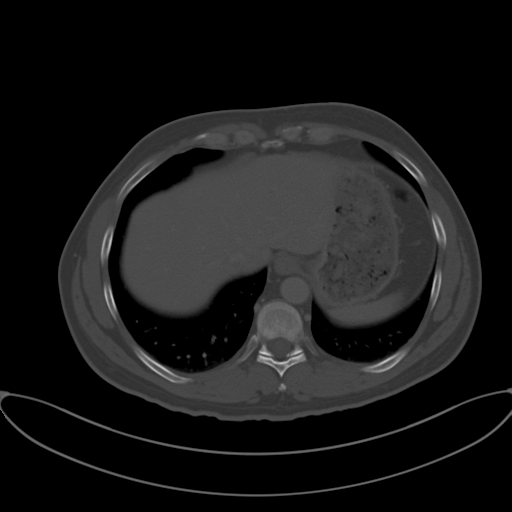
[im 113/134  bone]
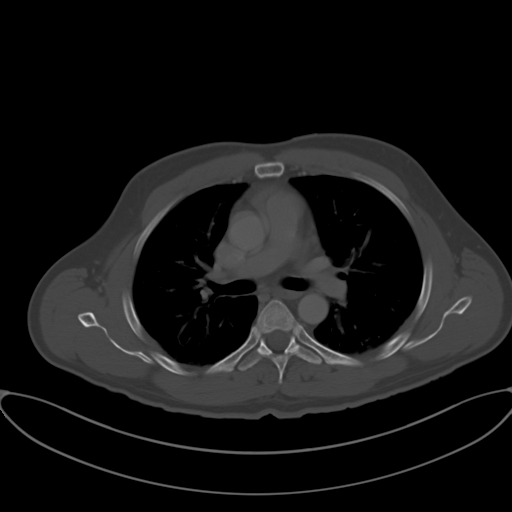
[im 123/134  soft-tissue]
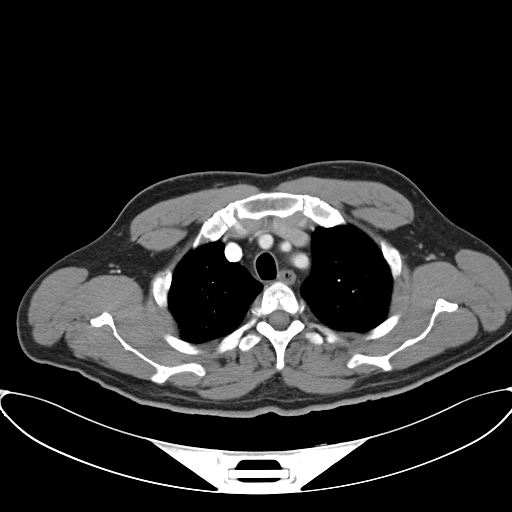
[im 123/134  bone]
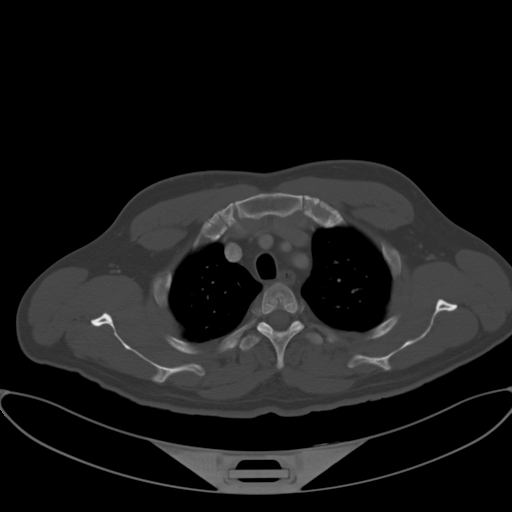

[Series 7: chest sag · sagittal · 0.66mm/px · 5 of 72 slices shown, 6 images]
[im 24/72  bone]
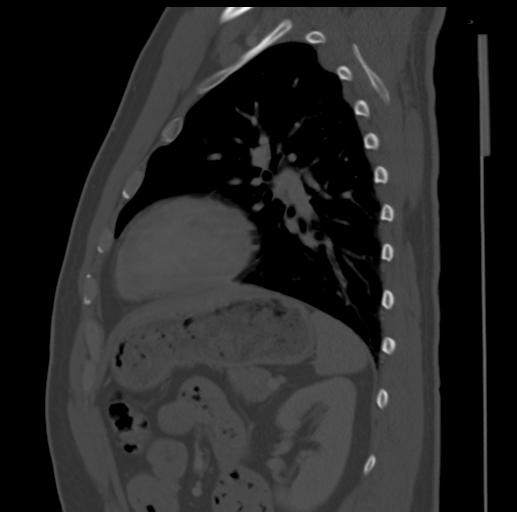
[im 30/72  bone]
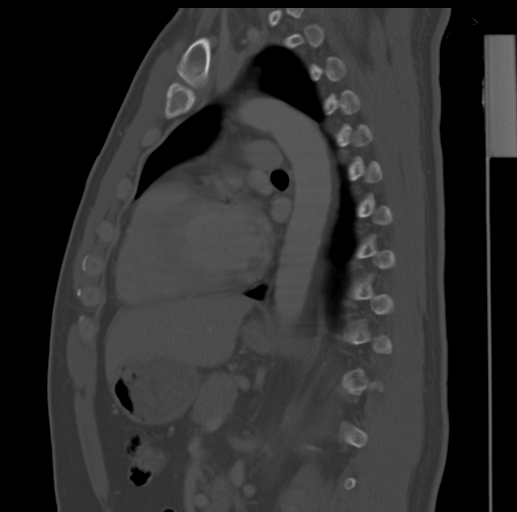
[im 36/72  soft-tissue]
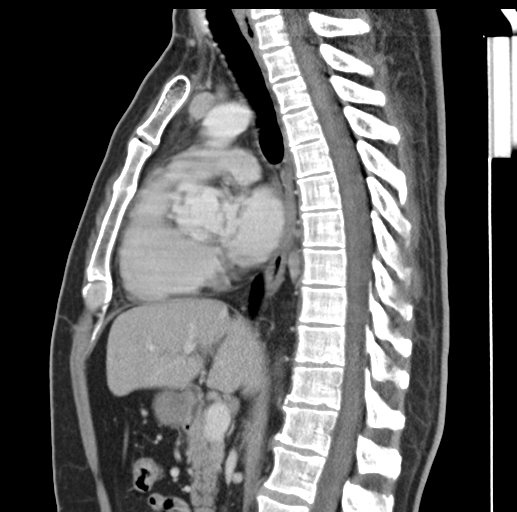
[im 36/72  bone]
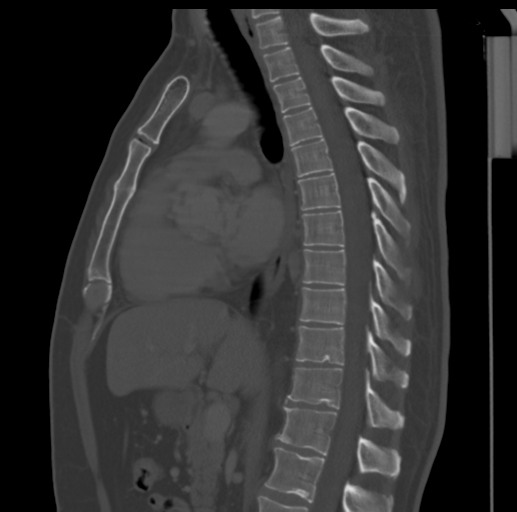
[im 42/72  bone]
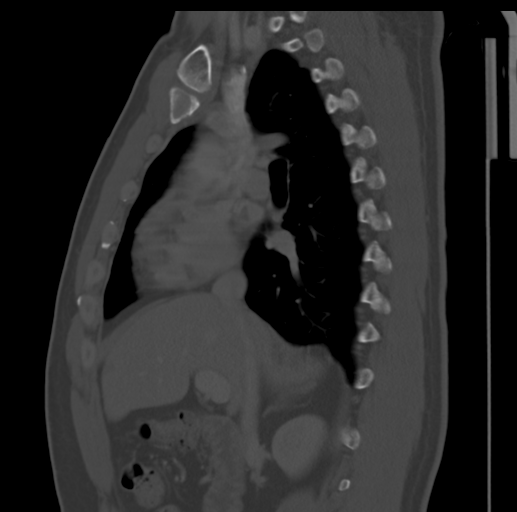
[im 48/72  bone]
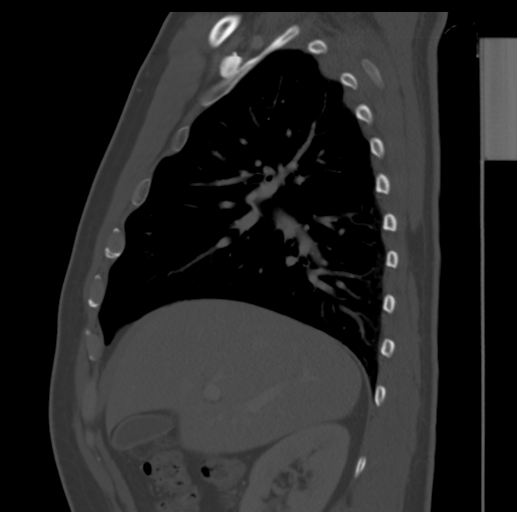

[14 of 27 positions shown; findings below may reference images not displayed]

FINDINGS: Lungs/pleura:  The lungs are clear.  There is mild dependent
atelectasis.
No pulmonary contusion, pneumothorax, or effusion is seen.

Heart:  Heart size.
Enlarged.  No pericardial effusion.  No significant coronary
artery calcifications.

Arch and Great vessels:  Normal caliber arch and great
vessels.  Nothing for aneurysm or dissection.

Mediastinum:  Images through the neck base and axilla are
unremarkable.  The visualized lower thyroid gland is
unremarkable.
There is no significant mediastinal hematoma.

Bones/joints:  There is air in the right shoulder joint.
This can be seen as a traction
artifact if the patient's shoulder was filled heart for
several minutes.
This region is generally beyond our field-of-view.  Consider
right shoulder films.

Chest wall/soft tissues:  Chest wall structures are
unremarkable.

ABDOMEN FINDINGS:
Liver   Hepatobiliary:
Liver intact without laceration or intrinsic abnormality.
The gallbladder is unremarkable.  The bile ducts are normal
in size for age.

Pancreas:  Normal pancreatic appearance for age.
Spleen:  Spleen size is 10.7 cm.  There are no intrinsic
splenic abnormalities.

Adrenals: No adrenal mass. Normal size.
Kidneys and ureters:  Normal renal size and shape. Nothing
for obstruction.

Vascular structures:   No aneurysm.  IVC normal.
Lymphatics:  There is no retroperitoneal or mesenteric root
adenopathy.

Stomach:  There is a large amount of food material in the
stomach.  The duodenum is unremarkable.
Small Bowel:  The small bowel is normal in size.  There is
no obstructive or inflammatory process.

Appendix: The appendix is seen on image 96 and appears
unremarkable.
Colon:  There is a moderate stool burden present.  There are
no specific colonic abnormalities.

Bladder:  The bladder is intact.  There is no pelvic ascites
to suggest bladder rupture.
Reproductive:  The prostate and seminal vesicles are
unremarkable.

Peritoneal cavity:  No ascites. No pneumoperitoneum.
Abdominal and pelvic walls:  There is no significant
abdominal wall hematoma.
Bones:  There are no acute bony abnormalities.

Results were initially reported by [REDACTED].
I agree with their initial interpretation.
IMPRESSION: CT chest demonstrates no acute post traumatic findings.

There is air within the anterior right shoulder joint which
I suspect is a traction
artifact considering the patient's nature of injury.
Consider the possibility of penetrating right shoulder
injury.
Consider right shoulder films.

CT studies of the abdomen and pelvis demonstrate no acute
posttraumatic injuries.
The liver and spleen are normal in size and both intact.
There is a large amount of food material in the stomach.
The small bowel is unremarkable.
Moderate stool throughout large bowel.

## 2020-09-11 MED ORDER — CLONAZEPAM 0.5 MG PO TABS *I*
ORAL_TABLET | ORAL | 0 refills | Status: DC
Start: 2020-09-11 — End: 2020-12-11
  Filled 2020-09-11: qty 90, 90d supply, fill #0

## 2020-09-13 ENCOUNTER — Other Ambulatory Visit: Payer: Self-pay | Admitting: Primary Care

## 2020-09-13 ENCOUNTER — Other Ambulatory Visit: Payer: Self-pay

## 2020-09-13 MED ORDER — ROSUVASTATIN CALCIUM 10 MG PO TABS *I*
10.0000 mg | ORAL_TABLET | Freq: Every day | ORAL | 1 refills | Status: DC
Start: 2020-09-13 — End: 2021-04-15
  Filled 2020-09-13: qty 90, 90d supply, fill #0
  Filled 2020-12-03: qty 90, 90d supply, fill #1

## 2020-09-20 ENCOUNTER — Other Ambulatory Visit: Payer: Self-pay

## 2020-10-02 ENCOUNTER — Other Ambulatory Visit: Payer: Self-pay

## 2020-10-02 MED ORDER — BUPROPION HCL 300 MG PO TB24 *I*
300.0000 mg | ORAL_TABLET | Freq: Every morning | ORAL | 2 refills | Status: DC
Start: 2020-10-02 — End: 2020-12-11
  Filled 2020-10-02: qty 30, 30d supply, fill #0
  Filled 2020-11-02: qty 30, 30d supply, fill #1
  Filled 2020-12-03: qty 30, 30d supply, fill #2

## 2020-10-02 MED ORDER — AMPHETAMINE-DEXTROAMPHETAMINE 15 MG PO CP24 *I*
15.0000 mg | ORAL_CAPSULE | Freq: Every morning | ORAL | 0 refills | Status: DC
Start: 2020-10-02 — End: 2021-01-09
  Filled 2020-10-02: qty 90, 90d supply, fill #0

## 2020-10-03 ENCOUNTER — Other Ambulatory Visit: Payer: Self-pay

## 2020-10-07 ENCOUNTER — Other Ambulatory Visit: Payer: Self-pay

## 2020-11-02 ENCOUNTER — Other Ambulatory Visit: Payer: Self-pay

## 2020-11-04 ENCOUNTER — Other Ambulatory Visit: Payer: Self-pay

## 2020-11-21 ENCOUNTER — Other Ambulatory Visit: Payer: Self-pay

## 2020-11-27 ENCOUNTER — Other Ambulatory Visit: Payer: Self-pay

## 2020-11-27 MED ORDER — DULOXETINE HCL 60 MG PO CPEP *I*
DELAYED_RELEASE_CAPSULE | ORAL | 0 refills | Status: DC
Start: 2020-11-27 — End: 2021-02-19
  Filled 2020-11-27: qty 90, 90d supply, fill #0

## 2020-12-03 ENCOUNTER — Other Ambulatory Visit: Payer: Self-pay

## 2020-12-04 ENCOUNTER — Other Ambulatory Visit: Payer: Self-pay

## 2020-12-09 ENCOUNTER — Other Ambulatory Visit: Payer: Self-pay

## 2020-12-10 ENCOUNTER — Telehealth: Payer: Self-pay | Admitting: Primary Care

## 2020-12-10 ENCOUNTER — Encounter: Payer: Self-pay | Admitting: Primary Care

## 2020-12-10 ENCOUNTER — Ambulatory Visit: Payer: No Typology Code available for payment source | Admitting: Primary Care

## 2020-12-10 DIAGNOSIS — R059 Cough, unspecified: Secondary | ICD-10-CM

## 2020-12-10 DIAGNOSIS — J45909 Unspecified asthma, uncomplicated: Secondary | ICD-10-CM

## 2020-12-10 DIAGNOSIS — U071 COVID-19: Secondary | ICD-10-CM | POA: Insufficient documentation

## 2020-12-10 MED ORDER — FLUTICASONE-SALMETEROL 100-50 MCG/ACT IN AEPB *I*
1.0000 | INHALATION_SPRAY | Freq: Two times a day (BID) | RESPIRATORY_TRACT | 0 refills | Status: DC | PRN
Start: 2020-12-10 — End: 2022-10-12

## 2020-12-10 NOTE — Progress Notes (Signed)
Video Visit     Location of Patient: home    Location of Telemedicine Provider: hospital / clinical location    Other participants in telemedicine encounter and roles:  N/A    This is an established patient visit.    Reason for visit:  Chief Complaint   Patient presents with   • Positive for COVID-19       Patient's problem list, allergies, and medications were reviewed and updated as appropriate.  Please see the EHR for full details.    HPI        Exam and data reviewed:      Assessment & Plan:      Consent was obtained from the patient to complete this video visit; including the potential for financial liability.      ___ minutes were spent during which the provider communicated directly with the patient, patient representative, and/or other attendees.

## 2020-12-10 NOTE — Telephone Encounter (Signed)
Patient called stating he tested positive for COVID-19 on Saturday. Patient has had a headache, chills, fatigue and sinus congestion. Starting last night patient states he has been having a pain in the middle of his chest under his breast bone when he takes a deep breath. He states this pain gets worse when he lays down. Patient feels he has mucus in his chest, but even with hot showers he has not been able to cough up any mucus. He states his lungs feel "tight" when he takes a deep breath but no wheezing. Patient has not taken any medication for his symptoms. He does not know of any COVID-19 exposures. Patient is fully vaccinated. Patient scheduled for a TeleMedicine Visit with Dr. Einar Gip today at 11:45am.

## 2020-12-10 NOTE — Progress Notes (Signed)
Video Visit     Location of Patient: home    Location of Telemedicine Provider: hospital / clinical location    Other participants in telemedicine encounter and roles:  none    This is an established patient visit.    Reason for visit: Positive for COVID-19      HPI    Patient's wife recently tested positive for COVID-19 12/06/2020.  The next day, he developed symptoms and did a home test and was positive himself.  He is fully vaccinated and boosted.  His symptoms are consistent of a dry cough, clear rhinitis as well as some mid upper chest pain with deep breathing.  He has no shortness of breath or wheezing.  He does have a headache and nausea.  No vomiting/diarrhea.  He does have normal smell and taste.  He does feel cold and sweaty at times but does not have a fever.  His temperature has been less than 99.7 degrees.  O2 sat has been ranging 95-100%.    Exam:    General: Pleasant but fatigued appearing in no acute distress  EYES:  No scleral injection or tearing  NOSE:  No rhinitis  LUNGS: No labored breathing.  He does have an occasional dry cough but no audible wheezing    Assessment/Plan:    1.  COVID-19 infection: He is fully vaccinated and boosted.  He does have a dry cough with mid upper chest pain upon deep breathing.  He could be having a flareup of his known reactive airway disease.  He was agreeable to try Advair inhaler for for at least the next 10 days.  Okay to use Mucinex DM.  I also recommended some Afrin nasal spray x3 days to help his rhinitis.  Tylenol/Advil as needed.  Call if no better/worse      Patient's problem list, allergies, and medications were reviewed and updated as appropriate.  Please see the EHR for full details.      Consent was obtained from the patient to complete this video visit; including the potential for financial liability.      __23_ minutes were spent during which the provider communicated directly with the patient, patient representative, and/or other  attendees.          Cheri Fowler, MD

## 2020-12-11 ENCOUNTER — Other Ambulatory Visit: Payer: Self-pay

## 2020-12-11 MED ORDER — CLONAZEPAM 0.5 MG PO TABS *I*
ORAL_TABLET | ORAL | 0 refills | Status: DC
Start: 2020-12-10 — End: 2021-03-05
  Filled 2020-12-11: qty 90, 90d supply, fill #0

## 2020-12-11 MED ORDER — BUPROPION HCL 300 MG PO TB24 *I*
ORAL_TABLET | ORAL | 2 refills | Status: DC
Start: 2020-12-11 — End: 2021-04-16
  Filled 2021-01-09: qty 30, 30d supply, fill #0
  Filled 2021-02-09: qty 30, 30d supply, fill #1
  Filled 2021-03-14: qty 30, 30d supply, fill #2

## 2020-12-11 MED ORDER — AMPHETAMINE-DEXTROAMPHETAMINE 15 MG PO CP24 *I*
ORAL_CAPSULE | ORAL | 0 refills | Status: DC
Start: 2020-12-27 — End: 2021-03-07
  Filled 2021-01-09: qty 90, 90d supply, fill #0

## 2020-12-13 ENCOUNTER — Other Ambulatory Visit: Payer: Self-pay

## 2020-12-17 ENCOUNTER — Other Ambulatory Visit: Payer: Self-pay

## 2020-12-24 ENCOUNTER — Other Ambulatory Visit: Payer: Self-pay

## 2021-01-09 ENCOUNTER — Other Ambulatory Visit: Payer: Self-pay

## 2021-01-09 MED ORDER — AMPHETAMINE-DEXTROAMPHETAMINE 15 MG PO CP24 *I*
ORAL_CAPSULE | ORAL | 0 refills | Status: DC
Start: 2021-01-09 — End: 2021-10-10
  Filled 2021-01-09: qty 90, 90d supply, fill #0

## 2021-01-10 ENCOUNTER — Other Ambulatory Visit: Payer: Self-pay

## 2021-01-13 ENCOUNTER — Other Ambulatory Visit: Payer: Self-pay

## 2021-01-14 ENCOUNTER — Other Ambulatory Visit: Payer: Self-pay

## 2021-01-15 ENCOUNTER — Other Ambulatory Visit: Payer: Self-pay

## 2021-01-18 ENCOUNTER — Other Ambulatory Visit: Payer: Self-pay

## 2021-02-10 ENCOUNTER — Other Ambulatory Visit: Payer: Self-pay

## 2021-02-19 ENCOUNTER — Other Ambulatory Visit: Payer: Self-pay

## 2021-02-19 MED ORDER — BUPROPION HCL 150 MG PO TB24 *I*
ORAL_TABLET | ORAL | 2 refills | Status: DC
Start: 2021-02-19 — End: 2021-05-28
  Filled 2021-02-19: qty 30, 30d supply, fill #0
  Filled 2021-03-17: qty 30, 30d supply, fill #1
  Filled 2021-04-15: qty 30, 30d supply, fill #2

## 2021-02-19 MED ORDER — DULOXETINE HCL 60 MG PO CPEP *I*
DELAYED_RELEASE_CAPSULE | ORAL | 0 refills | Status: DC
Start: 2021-02-19 — End: 2021-10-10
  Filled 2021-02-19: qty 90, 90d supply, fill #0

## 2021-03-05 ENCOUNTER — Ambulatory Visit: Payer: No Typology Code available for payment source | Admitting: Primary Care

## 2021-03-05 ENCOUNTER — Other Ambulatory Visit: Payer: Self-pay

## 2021-03-05 MED ORDER — CLONAZEPAM 0.5 MG PO TABS *I*
ORAL_TABLET | ORAL | 0 refills | Status: DC
Start: 2021-03-04 — End: 2021-06-11
  Filled 2021-03-05 – 2021-04-01 (×2): qty 90, 90d supply, fill #0

## 2021-03-05 MED ORDER — BUPROPION HCL 300 MG PO TB24 *I*
ORAL_TABLET | ORAL | 2 refills | Status: DC
Start: 2021-03-05 — End: 2021-03-07
  Filled 2021-03-05: qty 30, 30d supply, fill #0

## 2021-03-07 ENCOUNTER — Other Ambulatory Visit: Payer: Self-pay

## 2021-03-07 ENCOUNTER — Encounter: Payer: Self-pay | Admitting: Primary Care

## 2021-03-07 ENCOUNTER — Ambulatory Visit: Payer: No Typology Code available for payment source | Admitting: Primary Care

## 2021-03-07 VITALS — BP 122/70 | HR 96 | Temp 97.0°F | Ht 69.0 in | Wt 227.2 lb

## 2021-03-07 DIAGNOSIS — E785 Hyperlipidemia, unspecified: Secondary | ICD-10-CM

## 2021-03-07 DIAGNOSIS — E663 Overweight: Secondary | ICD-10-CM

## 2021-03-07 DIAGNOSIS — R7303 Prediabetes: Secondary | ICD-10-CM

## 2021-03-07 DIAGNOSIS — I10 Essential (primary) hypertension: Secondary | ICD-10-CM

## 2021-03-07 NOTE — Progress Notes (Signed)
Chief Complaint   Patient presents with    Hypertension    Hyperlipidemia    Hyperglycemia     Current Outpatient Medications   Medication Sig Dispense Refill    SUMAtriptan (IMITREX) 100 mg tablet Take 100 mg by mouth as needed        clonazePAM (KLONOPIN) 0.5 mg tablet Take one tablet (0.5 mg) by mouth daily.  Maximum 1 tablet per 24 hours. 90 tablet 0    DULoxetine (CYMBALTA) 60 mg DR capsule Take 1 capsule (60 mg) by mouth daily 90 capsule 0    buPROPion (WELLBUTRIN XL) 150 mg 24 hr tablet Take 1 tablet (150 mg) by mouth daily in the morning 30 tablet 2    amphetamine-dextroamphetamine (ADDERALL XR) 15 MG 24 hr capsule Take 1 capsule (15 mg) by mouth daily in the morning. Maximum 1 per day 90 capsule 0    buPROPion (WELLBUTRIN XL) 300 mg 24 hr tablet Take 1 tablet (300 mg) by mouth daily in the morning 30 tablet 2    fluticasone-salmeterol (ADVAIR/WIXELA) 100-50 mcg/dose diskus inhaler Inhale 1 puff into the lungs 2 times daily as needed  Rinse after use 1 each 0    rosuvastatin (CRESTOR) 10 mg tablet Take 1 tablet (10 mg total) by mouth daily 90 tablet 1    cholecalciferol (VITAMIN D) 2000 UNITS tablet Take 2,000 Units by mouth 2 times daily      Multiple Vitamin (MULTIVITAMIN) TABS Take 1 tablet by mouth daily       No current facility-administered medications for this visit.     Medications reviewed and no changes made    Current Outpatient Medications   Medication Sig Dispense Refill    SUMAtriptan (IMITREX) 100 mg tablet Take 100 mg by mouth as needed        clonazePAM (KLONOPIN) 0.5 mg tablet Take one tablet (0.5 mg) by mouth daily.  Maximum 1 tablet per 24 hours. 90 tablet 0    DULoxetine (CYMBALTA) 60 mg DR capsule Take 1 capsule (60 mg) by mouth daily 90 capsule 0    buPROPion (WELLBUTRIN XL) 150 mg 24 hr tablet Take 1 tablet (150 mg) by mouth daily in the morning 30 tablet 2    amphetamine-dextroamphetamine (ADDERALL XR) 15 MG 24 hr capsule Take 1 capsule (15 mg) by mouth daily in the  morning. Maximum 1 per day 90 capsule 0    buPROPion (WELLBUTRIN XL) 300 mg 24 hr tablet Take 1 tablet (300 mg) by mouth daily in the morning 30 tablet 2    fluticasone-salmeterol (ADVAIR/WIXELA) 100-50 mcg/dose diskus inhaler Inhale 1 puff into the lungs 2 times daily as needed  Rinse after use 1 each 0    rosuvastatin (CRESTOR) 10 mg tablet Take 1 tablet (10 mg total) by mouth daily 90 tablet 1    cholecalciferol (VITAMIN D) 2000 UNITS tablet Take 2,000 Units by mouth 2 times daily      Multiple Vitamin (MULTIVITAMIN) TABS Take 1 tablet by mouth daily       No current facility-administered medications for this visit.       CHOLESTEROL/PreDM MANAGEMENT: Chronic and Stable      BP 122/70    Pulse 96    Temp 36.1 C (97 F)    Ht 1.753 m (5\' 9" )    Wt 103.1 kg (227 lb 3.2 oz)    SpO2 95%    BMI 33.55 kg/m      He has gained 11 lbs. He is eating  his kids left overs and snacks at times    DIET:       --Eggs:   [x] Yes rare          --Fast Food:  [x] Yes pizza 0-1x week, occasional subs            --Junk Food:  [x] Yes rare cookies and 1 bowl ice cream a week           --Milk:    no       --Butter:    no       --Fiber:   [x] Yes 3 servings fruit/veges a day  [] No        MEDICATIONS:     HYPERTENSION MANAGEMENT:  Chronic and Stable    PATIENT DENIES: [x]  Chest Pain. [x]  Palpitations. [x]  Shortness of Breath.    SYMPTOMS: Once a week the past 2 months which resolved after eating lunch. Not a full blown migraine    HABITS:   --Sodium-   [] Yes  [x] No          --Exercise-  [x] Yes 5x week the past 2 weeks (elliptical/peloton and occasional weights) and he may start biking to and from work when it warms up            --Smoking-  [] Yes  [x] No          --Caffeine Use- [x] Yes  [] No             HOME BLOOD PRESSURE:  Normal     EXAM:    HEART: Regular rate and rhythm, no murmurs gallops or rubs.  LUNGS: Clear to auscultation bilaterally.     ASSESSMENT/PLAN:    1.  HTN: Presently well-controlled and at goal of less than 140/90.   Diet and exercise reviewed.  Currently on no medicine  2.  Hyperlipidemia: His last profile was not at goal on Crestor.  His triglycerides were greater than 150.  Diet and exercise reviewed.  Check lipid profile/LFTs  3.  Overweight/prediabetes: Currently on no medicine.  Diet and exercise reviewed.  Check fasting SMA-8/A1c  4.  Health maintenance: He has had his flu shot as well as his COVID-19 booster.  He did have COVID-19 infection 12/07/2020    RETURN: 6 months HTN/cholesterol/prediabetes

## 2021-03-14 ENCOUNTER — Other Ambulatory Visit: Payer: Self-pay

## 2021-03-17 ENCOUNTER — Other Ambulatory Visit: Payer: Self-pay

## 2021-03-21 ENCOUNTER — Other Ambulatory Visit: Payer: Self-pay

## 2021-03-31 ENCOUNTER — Other Ambulatory Visit: Payer: Self-pay

## 2021-04-01 ENCOUNTER — Other Ambulatory Visit: Payer: Self-pay

## 2021-04-02 ENCOUNTER — Other Ambulatory Visit: Payer: Self-pay

## 2021-04-15 ENCOUNTER — Other Ambulatory Visit: Payer: Self-pay

## 2021-04-15 ENCOUNTER — Other Ambulatory Visit: Payer: Self-pay | Admitting: Primary Care

## 2021-04-15 MED ORDER — ROSUVASTATIN CALCIUM 10 MG PO TABS *I*
10.0000 mg | ORAL_TABLET | Freq: Every day | ORAL | 1 refills | Status: DC
Start: 2021-04-15 — End: 2021-12-29
  Filled 2021-04-15: qty 90, 90d supply, fill #0

## 2021-04-16 ENCOUNTER — Other Ambulatory Visit: Payer: Self-pay

## 2021-04-16 MED ORDER — BUPROPION HCL 300 MG PO TB24 *I*
ORAL_TABLET | ORAL | 0 refills | Status: DC
Start: 2021-04-15 — End: 2021-07-08
  Filled 2021-04-16: qty 90, 90d supply, fill #0

## 2021-04-16 MED ORDER — AMPHETAMINE-DEXTROAMPHETAMINE 15 MG PO CP24 *I*
ORAL_CAPSULE | ORAL | 0 refills | Status: DC
Start: 2021-04-16 — End: 2021-10-10
  Filled 2021-04-16: qty 52, 52d supply, fill #0
  Filled 2021-04-16: qty 38, 38d supply, fill #0

## 2021-04-17 ENCOUNTER — Other Ambulatory Visit: Payer: Self-pay

## 2021-04-22 ENCOUNTER — Other Ambulatory Visit: Payer: Self-pay

## 2021-05-28 ENCOUNTER — Other Ambulatory Visit: Payer: Self-pay

## 2021-05-28 MED ORDER — BUPROPION HCL 150 MG PO TB24 *I*
ORAL_TABLET | ORAL | 0 refills | Status: DC
Start: 2021-05-28 — End: 2021-10-10
  Filled 2021-05-28: qty 90, 90d supply, fill #0

## 2021-05-28 MED ORDER — DULOXETINE HCL 60 MG PO CPEP *I*
DELAYED_RELEASE_CAPSULE | ORAL | 0 refills | Status: DC
Start: 2021-05-28 — End: 2021-10-10
  Filled 2021-05-28: qty 90, 90d supply, fill #0

## 2021-06-02 ENCOUNTER — Other Ambulatory Visit: Payer: Self-pay

## 2021-06-11 ENCOUNTER — Other Ambulatory Visit: Payer: Self-pay

## 2021-06-11 MED ORDER — CLONAZEPAM 0.5 MG PO TABS *I*
ORAL_TABLET | ORAL | 0 refills | Status: DC
Start: 2021-06-11 — End: 2021-10-10
  Filled 2021-06-11: qty 90, 90d supply, fill #0

## 2021-06-20 ENCOUNTER — Other Ambulatory Visit: Payer: Self-pay

## 2021-06-30 ENCOUNTER — Other Ambulatory Visit: Payer: Self-pay

## 2021-07-08 ENCOUNTER — Other Ambulatory Visit: Payer: Self-pay

## 2021-07-08 MED ORDER — BUPROPION HCL 300 MG PO TB24 *I*
ORAL_TABLET | ORAL | 0 refills | Status: DC
Start: 2021-07-08 — End: 2021-10-10
  Filled 2021-07-08: qty 90, 90d supply, fill #0

## 2021-07-18 ENCOUNTER — Other Ambulatory Visit: Payer: Self-pay

## 2021-08-28 ENCOUNTER — Encounter: Payer: Self-pay | Admitting: Primary Care

## 2021-08-28 ENCOUNTER — Ambulatory Visit
Admission: AD | Admit: 2021-08-28 | Discharge: 2021-08-28 | Disposition: A | Discharge status: Home / Self Care | Payer: No Typology Code available for payment source | Source: Ambulatory Visit | Attending: Student in an Organized Health Care Education/Training Program | Admitting: Student in an Organized Health Care Education/Training Program

## 2021-08-28 DIAGNOSIS — J3489 Other specified disorders of nose and nasal sinuses: Secondary | ICD-10-CM | POA: Insufficient documentation

## 2021-08-28 DIAGNOSIS — R0981 Nasal congestion: Secondary | ICD-10-CM | POA: Insufficient documentation

## 2021-08-28 MED ORDER — FLUTICASONE PROPIONATE 50 MCG/ACT NA SUSP *I*
1.0000 | Freq: Every day | NASAL | 0 refills | Status: DC
Start: 2021-08-28 — End: 2021-10-10

## 2021-08-28 NOTE — UC Provider Note (Signed)
Chief Complaint:   Chief Complaint   Patient presents with    Nasal Congestion        HPI:  46 y.o. male w hx as noted in chart presents to Urgent Care for URI symptoms x14 days.     Symptoms are remained the same   Tried Mucinex, allergy meds but has not improved  Has tested for COVID a few times and negative all times   Had a cough at first but that has improved   Symptoms include congestion, head is foggy, runny nose    Denies dental pain, facial pain, fevers, nausea, vomiting, diarrhea     COVID exposure / other sick contacts: no  COVID 19 positive in the last 3 months: no  Fully vaccinated for COVID 19: yes    VITALS:  BP 143/86 (BP Location: Left arm)    Pulse 69    Temp 36.6 C (97.9 F)    Resp 18    SpO2 97%     ROS: see HPI above      PHYSICAL EXAM:  Appearance: Well appearing, no acute distress   Eyes: no conjunctival injection or drainage   Ears: Normal TMs and canals bilaterally   Nose: congestion, frontal and maxillary sinuses non tender to palpation   Mouth/Throat: mild erythema of oropharynx, no petechiae or exudate, no PTA   Neck: No cervical LAD, no tenderness, full AROM   CV: RRR, no murmur   Pulm: no acute respiratory distress, Lungs clear to auscultation bilaterally   Skin: no pallor or rash     MEDICAL DECISION MAKING:  Assessment: 45 y.o. male w hx as noted in chart presents to UC with nasal congestion and otherwise resolving URI symptoms. Suspect this is more viral etiology despite length of course; will not treat with abx given lack of facial pain, fevers, and sore throat. Recommend supportive care and to see PCP if develops worse symptoms concerning for bacterial sinusitis     Differential Diagnosis: Viral URI w cough, Bronchitis, Acute nasopharyngitis, Sinusitis, COVID 19    Plan and Results:     Orders Placed This Encounter    fluticasone (FLONASE) 50 MCG/ACT nasal spray       No results found for this or any previous visit (from the past 24 hour(s)).      Diagnosis and Disposition:  1.  Nasal congestion          Please follow up with your physician as below:        Given the patient's reassuring vital signs and overall well appearance, the patient can be managed safely at home. Patient advised to call PCP or return to urgent care for any worsening or concerning symptoms as reviewed and provided on AVS.    Bubba Camp MD MPH  Urgent Care Physician        Bubba Camp, MD  08/28/21 [phone]

## 2021-08-28 NOTE — ED Triage Notes (Signed)
Pt c/o sinus congestion x2 weeks, thought it was allergy and has ben taking OTC medication without relief, denies chills or fever or other c/o. Pt reports headache.

## 2021-09-02 ENCOUNTER — Telehealth: Payer: Self-pay

## 2021-09-02 NOTE — Telephone Encounter (Signed)
LVM for patient to return call at [phone]. To confirm that appointment on 10/07 is for a follow up visit, patient would like an ablation.

## 2021-09-03 ENCOUNTER — Other Ambulatory Visit: Payer: Self-pay

## 2021-09-03 MED ORDER — DULOXETINE HCL 60 MG PO CPEP *I*
DELAYED_RELEASE_CAPSULE | ORAL | 1 refills | Status: DC
Start: 2021-09-03 — End: 2021-10-10
  Filled 2021-09-03: qty 90, 90d supply, fill #0

## 2021-09-12 ENCOUNTER — Ambulatory Visit: Payer: No Typology Code available for payment source | Admitting: Physical Medicine and Rehabilitation

## 2021-09-12 ENCOUNTER — Ambulatory Visit: Payer: No Typology Code available for payment source | Admitting: Primary Care

## 2021-09-13 ENCOUNTER — Other Ambulatory Visit: Payer: Self-pay

## 2021-09-19 ENCOUNTER — Other Ambulatory Visit: Payer: Self-pay

## 2021-09-19 ENCOUNTER — Encounter: Payer: Self-pay | Admitting: Physical Medicine and Rehabilitation

## 2021-09-19 ENCOUNTER — Ambulatory Visit
Payer: No Typology Code available for payment source | Attending: Physical Medicine and Rehabilitation | Admitting: Physical Medicine and Rehabilitation

## 2021-09-19 VITALS — BP 135/85 | HR 63 | Temp 98.2°F | Wt 227.3 lb

## 2021-09-19 DIAGNOSIS — M47817 Spondylosis without myelopathy or radiculopathy, lumbosacral region: Secondary | ICD-10-CM | POA: Insufficient documentation

## 2021-09-19 NOTE — Progress Notes (Signed)
History of Present Illness  Chief Complaint   Patient presents with    Follow-up       HPI     This is a recurrent problem. Episode onset: 12 years with worsening over the past 3 years. The problem occurs constantly. The problem has improved. Pain location: midline and left paramidline lumbosacral region, b/l anterior and posterior thighs. The quality of the pain is described as aching (deep). The pain is at a severity of 4/10. The symptoms are aggravated by standing (walking. Mitigated by lying down). Treatments tried: L5S1 fusion 1999 (Dr. Isabell Jarvis) with relief, unspecified ESI's 2008 with initial relief, multiple sessions of physical therapy including 3-4 years without relief, chiropractic treatment 2008 without relief. Improvement on treatment: current pain meds include duloxetine; prior pain meds include meloxicam and gabapentin. He previously reported 80% relief s/p a b/l L3,4 medial branch neurotomy 08/03/19. He notes increased pain with running.       Review of Systems   Constitutional: Negative.    Gastrointestinal: Negative.    Genitourinary: Negative.         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    Vertigo     Steroid injection into right ear 12/17/2011     Social History     Socioeconomic History    Marital status: Legally Separated     Spouse name: Not on file    Number of children: 2    Years of education: Not on file    Highest education level: Not on file   Tobacco Use    Smoking status: Former Smoker     Packs/day: 1.00     Years: 6.00     Pack years: 6.00     Types: Cigarettes     Quit date: 12/07/2005     Years since  quitting: 15.7    Smokeless tobacco: Never Used    Tobacco comment: Quit 2007   Substance and Sexual Activity    Alcohol use: Yes     Alcohol/week: 4.0 standard drinks     Types: 4 Standard drinks or equivalent per week     Comment: social    Drug use: Yes     Types: Marijuana    Sexual activity: Yes     Partners: Female     Birth control/protection: Condom   Other Topics Concern    Not on file   Social History Narrative    Not on file     Family History   Problem Relation Age of Onset    High cholesterol Father     Hypertension Father     Cancer Father     High Blood Pressure Father     Conversion Other         [id number] Health Status^^Active^Father(A)HTN.Chol.Allergies Mother(A)Migraines 1 Brother(A)ADD 2 Sisters(A) 1 Daughter(A) PatGF(A)CAD.Glaucoma  DM2.Chol PatGM(D)49.MI MatGF(D)60s.Bone Ca MatGM(A)    Hearing loss Maternal Grandmother     Cancer Maternal Grandfather           I have reviewed the patient's medications and allergies and  updated them as appropriate in the electronic medical record.       Objective:     Today's vitals:  weight is 103.1 kg (227 lb 4.7 oz). His temperature is 36.8 C (98.2 F). His blood pressure is 135/85 and his pulse is 63.      Physical Exam  Constitutional:       Appearance: He is well-developed.   Pulmonary:      Effort: Pulmonary effort is normal.   Neurological:      Gait: Gait is intact.      Deep Tendon Reflexes: Strength normal.       Neurologic Exam     Mental Status   Level of consciousness: alert    Motor Exam     Strength   Strength 5/5 throughout.     Gait, Coordination, and Reflexes     Gait  Gait: normal                 MRI L/S spine 05/30/19    Posterior fusion hardware and pedicle screws L5S1.  75% disc height loss with type I modic changes L45.  Moderate desiccation with <25% disc height loss L23 and L34.    Assessment/Plan       Lumbosacral spondylosis without myelopathy  1. Axial low back pain c/w painful facet joint arthrosis L45 vs IDD  L45 s/p a b/l L3,4 medial branch neurotomy.  2. H/o L5S1 fusion.    1. Continue current analgesic regimen.  2. Continue home exercise program. Spine-specific core stabilization exercise program and spine ergonomic hygiene handouts provided today.  3. If symptoms recur, will reassess b/l L45 facet joints and if positive x 1, neurotomy.  4. F/u prn.                  Follow up  recommended: Follow up if symptoms worsen or fail to improve.     Patient verbalized understanding of the above instructions.     Current Outpatient Medications   Medication    rosuvastatin (CRESTOR) 10 mg tablet    DULoxetine (CYMBALTA) 60 mg DR capsule    fluticasone (FLONASE) 50 MCG/ACT nasal spray    buPROPion (WELLBUTRIN XL) 300 mg 24 hr tablet    clonazePAM (KLONOPIN) 0.5 mg tablet    DULoxetine (CYMBALTA) 60 mg DR capsule    buPROPion (WELLBUTRIN XL) 150 mg 24 hr tablet    amphetamine-dextroamphetamine (ADDERALL XR) 15 MG 24 hr capsule    SUMAtriptan (IMITREX) 100 mg tablet    DULoxetine (CYMBALTA) 60 mg DR capsule    amphetamine-dextroamphetamine (ADDERALL XR) 15 MG 24 hr capsule    fluticasone-salmeterol (ADVAIR/WIXELA) 100-50 mcg/dose diskus inhaler    cholecalciferol (VITAMIN D) 2000 UNITS tablet    Multiple Vitamin (MULTIVITAMIN) TABS     No current facility-administered medications for this visit.       Solara Goodchild L Vaneza Pickart, DO  09/19/2021     Ally Knodel L Catrina Fellenz, DO  Electronically signed on 09/19/2021 at 10:11 AM.

## 2021-09-19 NOTE — Assessment & Plan Note (Addendum)
1. Axial low back pain c/w painful facet joint arthrosis L45 vs IDD L45 s/p a b/l L3,4 medial branch neurotomy.  2. H/o L5S1 fusion.    1. Continue current analgesic regimen.  2. Continue home exercise program. Spine-specific core stabilization exercise program and spine ergonomic hygiene handouts provided today.  3. If symptoms recur, will reassess b/l L45 facet joints and if positive x 1, neurotomy.  4. F/u prn.

## 2021-10-01 ENCOUNTER — Other Ambulatory Visit: Payer: Self-pay

## 2021-10-01 MED ORDER — BUPROPION HCL 300 MG PO TB24 *I*
ORAL_TABLET | ORAL | 0 refills | Status: DC
Start: 2021-10-01 — End: 2021-10-10
  Filled 2021-10-01: qty 90, 90d supply, fill #0

## 2021-10-10 ENCOUNTER — Other Ambulatory Visit: Payer: Self-pay

## 2021-10-10 ENCOUNTER — Encounter: Payer: Self-pay | Admitting: Primary Care

## 2021-10-10 ENCOUNTER — Ambulatory Visit: Payer: No Typology Code available for payment source | Admitting: Primary Care

## 2021-10-10 VITALS — BP 116/68 | HR 86 | Temp 98.7°F | Ht 69.0 in | Wt 213.0 lb

## 2021-10-10 DIAGNOSIS — E785 Hyperlipidemia, unspecified: Secondary | ICD-10-CM

## 2021-10-10 DIAGNOSIS — I10 Essential (primary) hypertension: Secondary | ICD-10-CM

## 2021-10-10 DIAGNOSIS — R079 Chest pain, unspecified: Secondary | ICD-10-CM

## 2021-10-10 DIAGNOSIS — R7303 Prediabetes: Secondary | ICD-10-CM

## 2021-10-10 NOTE — Progress Notes (Signed)
Chief Complaint   Patient presents with    Hypertension    Hyperlipidemia    Hyperglycemia     Current Outpatient Medications   Medication Sig Dispense Refill    rosuvastatin (CRESTOR) 10 mg tablet Take 1 tablet (10 mg total) by mouth daily 90 tablet 1    cholecalciferol (VITAMIN D) 2000 UNITS tablet Take 2,000 Units by mouth 2 times daily      Multiple Vitamin (MULTIVITAMIN) TABS Take 1 tablet by mouth daily      SUMAtriptan (IMITREX) 100 mg tablet Take 100 mg by mouth as needed        fluticasone-salmeterol (ADVAIR/WIXELA) 100-50 mcg/dose diskus inhaler Inhale 1 puff into the lungs 2 times daily as needed  Rinse after use 1 each 0     No current facility-administered medications for this visit.     Medications reviewed and no changes made    Current Outpatient Medications   Medication Sig Dispense Refill    rosuvastatin (CRESTOR) 10 mg tablet Take 1 tablet (10 mg total) by mouth daily 90 tablet 1    cholecalciferol (VITAMIN D) 2000 UNITS tablet Take 2,000 Units by mouth 2 times daily      Multiple Vitamin (MULTIVITAMIN) TABS Take 1 tablet by mouth daily      SUMAtriptan (IMITREX) 100 mg tablet Take 100 mg by mouth as needed        fluticasone-salmeterol (ADVAIR/WIXELA) 100-50 mcg/dose diskus inhaler Inhale 1 puff into the lungs 2 times daily as needed  Rinse after use 1 each 0     No current facility-administered medications for this visit.       CHOLESTEROL/PreDM MANAGEMENT: Chronic and Stable. Lost 14 lbs with more exercise/less eating out      BP 116/68    Pulse 86    Temp 37.1 C (98.7 F)    Ht 1.753 m (5\' 9" )    Wt 96.6 kg (213 lb)    SpO2 95%    BMI 31.45 kg/m      DIET:       --Eggs:   [x] Yes   2/month        --Fast Food:  [x] Yes sub 1x month and pizza 0-1x month            --Junk Food:  [x] Yes holidays/celebrations          --Milk:   no        --Butter:   no       --Fiber:   [x] Yes 3 servings fruit/veges a day, oatmeal for breakfast      MEDICATIONS:  On Crestor    HYPERTENSION MANAGEMENT:   Chronic and Stable    PATIENT DENIES: [x]  Headaches. [x]  Palpitations. [x]  Shortness of Breath.    SYMPTOMS:  has a non radiating "vibration" left chest when stressed/ruminating. He is in the process of getting a divorce and has some work stress. He likes to golf/exercise. He has a new puppy.    HABITS:   --Sodium-   [] Yes  [x] No          --Exercise-  [x] Yes jogs 2x week 2-3 miles the past few months, Gym 3x week (cardio 30 minutes and weights) as of last week            --Smoking-  [] Yes  [x] No          --Caffeine Use- [x] Yes black coffee 2 cups/d             HOME BLOOD PRESSURE: normal when  he checks it with parents cuff    EXAM:    AFFECT: Pleasant and cheerful in no acute distress  HEART: Regular rate and rhythm, no murmurs gallops or rubs.  LUNGS: Clear to auscultation bilaterally.    ASSESSMENT/PLAN:    1.  HTN: Presently well-controlled and at goal of less than 140/90.  Diet and exercise reviewed.  He is currently on no medicine  2.  Hyperlipidemia: His last profile was not at goal.  He is on Crestor.  He has started to improve his exercise.  Diet reviewed.  He is planning on doing his blood work in the near future  3.  Prediabetes: Currently on no medicine.  Continue diet and exercise.  I will await his blood work   4.  Atypical chest pain: He does not get it when he exercises.  Stress/rumination seem to be associated with it.  Supportive counseling given.  Call if no better/worse  5.  Health maintenance: Flu shot given.  He also plans on getting his omicron booster in the near future    RETURN: 6 months HTN/cholesterol/prediabetes

## 2021-11-24 ENCOUNTER — Encounter: Payer: Self-pay | Admitting: Gastroenterology

## 2021-12-19 ENCOUNTER — Encounter: Payer: Self-pay | Admitting: Primary Care

## 2021-12-19 ENCOUNTER — Other Ambulatory Visit
Admission: RE | Admit: 2021-12-19 | Discharge: 2021-12-19 | Disposition: A | Discharge status: Home / Self Care | Payer: No Typology Code available for payment source | Source: Ambulatory Visit | Attending: Primary Care | Admitting: Primary Care

## 2021-12-19 DIAGNOSIS — I10 Essential (primary) hypertension: Secondary | ICD-10-CM | POA: Insufficient documentation

## 2021-12-19 DIAGNOSIS — R7303 Prediabetes: Secondary | ICD-10-CM | POA: Insufficient documentation

## 2021-12-19 DIAGNOSIS — E782 Mixed hyperlipidemia: Secondary | ICD-10-CM

## 2021-12-19 DIAGNOSIS — E785 Hyperlipidemia, unspecified: Secondary | ICD-10-CM | POA: Insufficient documentation

## 2021-12-19 LAB — LIPID PANEL
Chol/HDL Ratio: 4.6
Cholesterol: 243 mg/dL — AB
HDL: 53 mg/dL (ref 40–60)
LDL Calculated: 161 mg/dL — AB
Non HDL Cholesterol: 190 mg/dL
Triglycerides: 145 mg/dL

## 2021-12-19 LAB — HEMOGLOBIN A1C: Hemoglobin A1C: 5.3 %

## 2021-12-19 LAB — BASIC METABOLIC PANEL
Anion Gap: 11 (ref 7–16)
CO2: 28 mmol/L (ref 20–28)
Calcium: 10 mg/dL (ref 9.0–10.3)
Chloride: 101 mmol/L (ref 96–108)
Creatinine: 1.02 mg/dL (ref 0.67–1.17)
Glucose: 91 mg/dL (ref 60–99)
Lab: 11 mg/dL (ref 6–20)
Potassium: 4.6 mmol/L (ref 3.3–5.1)
Sodium: 140 mmol/L (ref 133–145)
eGFR BY CREAT: 93 *

## 2021-12-19 LAB — ALT: ALT: 19 U/L (ref 0–50)

## 2021-12-19 LAB — AST: AST: 16 U/L (ref 0–50)

## 2021-12-29 ENCOUNTER — Other Ambulatory Visit: Payer: Self-pay | Admitting: Primary Care

## 2021-12-29 ENCOUNTER — Encounter: Payer: Self-pay | Admitting: Primary Care

## 2021-12-29 MED ORDER — ROSUVASTATIN CALCIUM 10 MG PO TABS *I*
10.0000 mg | ORAL_TABLET | Freq: Every day | ORAL | 0 refills | Status: DC
Start: 2021-12-29 — End: 2022-03-30

## 2022-01-10 ENCOUNTER — Other Ambulatory Visit: Payer: Self-pay

## 2022-01-10 ENCOUNTER — Encounter: Payer: Self-pay | Admitting: Family

## 2022-01-10 ENCOUNTER — Ambulatory Visit: Payer: No Typology Code available for payment source | Attending: Family | Admitting: Family

## 2022-01-10 VITALS — BP 131/84 | HR 81 | Temp 97.7°F | Resp 18

## 2022-01-10 DIAGNOSIS — H6122 Impacted cerumen, left ear: Secondary | ICD-10-CM | POA: Insufficient documentation

## 2022-01-10 DIAGNOSIS — H60502 Unspecified acute noninfective otitis externa, left ear: Secondary | ICD-10-CM | POA: Insufficient documentation

## 2022-01-10 MED ORDER — NEOMYCIN-POLYMYXIN-HC 3.5-10000-1 OT SOLN *I*
4.0000 [drp] | Freq: Four times a day (QID) | OTIC | 0 refills | Status: AC
Start: 2022-01-10 — End: 2022-01-17

## 2022-01-10 NOTE — Patient Instructions (Addendum)
[name] you were seen at urgent care for left ear pain and fullness. You were treated for an outer ear infection.     Your left ear was flushed, but we were unable to remove the wax. Please purchase and use over-the-counter debrox ear drops as prescribed per packaging to reduce the amount of wax produced in your ears.    I have prescribed an antibiotic ear drops called cortisporin. Please use these drops to the affected ear as directed for the next 7 days.     You may use Tylenol 650 mg every 6 hours for pain. If you are able to, you may use Ibuprofen 400-600 mg every 6 hours for pain. Take with food to avoid upset stomach, nausea or vomiting. Please do not take if you were advised to avoid taking either Tylenol or Ibuprofen. For better pain and/or fever control, try alternating the use of Tylenol 650 mg and Ibuprofen 400-600 mg every 3 hours until your symptoms improve. Do not exceed 3,000 mg of Tylenol or 2,400 mg of Ibuprofen in a 24 hour period.     Avoid swimming or submerging your head underwater until symptoms improve. Please use an ear plug in the affected ear during showering to prevent water from entering your ear canal.    Okay to use warm moist compresses over affected ear which will help alleviate some discomfort. Elevate head with an extra pillow at night to reduce discomfort of laying flat. Do not put any object into your ears, including Q-tips.    Please be seen by ENT for reevaluation if you develop worsening ear pain, ear drainage or bleeding, ear ringing, loss of hearing, vomiting, fever. ENT phone number: [phone]       Thank you [name] for choosing [facility] urgent care for your health concerns.    If your condition changes and/or worsens, please follow up with your primary care provider or return to [facility] urgent care for further evaluation.    If short of breath, chest pains or any other concerns please report to the emergency room.    In the event of an Emergency dial 911.

## 2022-01-10 NOTE — UC Provider Note (Signed)
Chief Complaint:   Chief Complaint   Patient presents with    Otalgia     Patient complains of left sided ear pain and fullness. S/S started last night.        HPI:  46 y.o. male with history as noted in chart presents to Urgent Care with concern regarding left ear(s).     Patient complains of 1 day(s) of the following symptoms: fullness and pain    Denies: blockage, discharge, hearing loss and tinnitus    Recent or current URI / allergy symptoms: no  Recent ear infection:  no     VITALS:  BP 131/84    Pulse 81    Temp 36.5 C (97.7 F) (Temporal)    Resp 18    SpO2 96%     ROS: see above / below  Fever: No  Rash: no     PHYSICAL EXAM:  Appearance: well appearing, no acute distress   R Ear: Normal TM and external ear canal   L Ear: Unable to assess TM due to cerumen impaction. External ear canal erythematous and swollen.   Nose: no significant nasal swelling, congestion, or rhinorrhea   Throat: no redness, no swelling, no exudate   Neck: no cervical LAD, no tenderness, soft  Pulm: no acute respiratory distress       MEDICAL DECISION MAKING:  Assessment: 47 y.o. male with history as noted in chart presents with fullness and pain of left ear(s). Cerumen impaction noted in left ear. Left external ear canal erythematous and swollen. Reassuring exam and vitals.     Differential Diagnosis:   Cerumen Impaction  Otitis Externa  Acute Suppurative Otitis Media  Acute Serous Otitis Media  Acute Allergic Otitis Media  Viral URI  Eustachian Tube Dysfunction        Plan and Results:   Orders Placed This Encounter    Ear irrigation    neomycin-polymyxin-hydrocortisone (CORTISPORIN) otic solution     Left ear irrigation/lavage with warm water was completed on the patient for cerumen impaction by the nurse. The procedure was not supervised by me. Results of procedure were unable to remove cerumen following my assessment. Patient did not tolerate procedure due to pain.       No results found for this visit on  01/10/22.    Diagnosis and Disposition:  1. Impacted cerumen of left ear    2. Acute otitis externa of left ear, unspecified type            Follow up with ENT if symptoms worsen or fail to improve.    Appropriate Handouts provided as needed.    Given the patient's reassuring vital signs and overall well appearance, the patient can be managed safely at home. Patient advised to call PCP or return to urgent care for any worsening or concerning symptoms as reviewed and provided on AVS.

## 2022-01-28 ENCOUNTER — Ambulatory Visit: Payer: No Typology Code available for payment source | Admitting: Otolaryngology

## 2022-03-05 ENCOUNTER — Encounter: Payer: Self-pay | Admitting: Emergency Medicine

## 2022-03-05 ENCOUNTER — Ambulatory Visit: Payer: No Typology Code available for payment source | Attending: Emergency Medicine | Admitting: Emergency Medicine

## 2022-03-05 ENCOUNTER — Other Ambulatory Visit: Payer: Self-pay

## 2022-03-05 VITALS — BP 128/90 | HR 69 | Temp 97.5°F | Resp 18

## 2022-03-05 DIAGNOSIS — R9431 Abnormal electrocardiogram [ECG] [EKG]: Secondary | ICD-10-CM

## 2022-03-05 DIAGNOSIS — Z789 Other specified health status: Secondary | ICD-10-CM | POA: Insufficient documentation

## 2022-03-05 DIAGNOSIS — R002 Palpitations: Secondary | ICD-10-CM | POA: Insufficient documentation

## 2022-03-05 NOTE — Patient Instructions (Addendum)
Your heart exam was normal and your EKG was unremarkable.    If you develop chest pains or trouble breathing, we recommend you go directly to the hospital for an acute assessment.    Please call your primary care doctor's office and arrange a follow-up from today's visit.

## 2022-03-05 NOTE — [facility] Provider Note (Signed)
History     Chief Complaint   Patient presents with    Palpitations     Patient describes heart "fluttering" chest tightness for the past couple of days.  Worse when laying down.  No recent sickness.  Feels like he can't take a deep breath.     Patient states that this weekend he went skiing all weekend long and it was very active.  He states that he noticed a strange what he describes as a fluttering sensation in his chest that he is not sure what it is.  It is essentially there all the time.  He is not sure if it is anxiety he says it makes it worse with laying flat.  He is not necessarily dyspneic but he says he just feels different.  Unclear if he called his doctor but presents to urgent care with the symptoms today.  No history of DVT or PE.  No swelling of the feet.  Does have a history of anxiety.      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    Vertigo     Steroid injection into right ear 12/17/2011        Patient Active Problem List   Diagnosis Code    Right Medial Collateral Ligament Tear 3/05     ADD (attention deficit disorder) F98.8    Right Medial Meniscus Tear 3/05     Allergic Rhinitis J30.9    Herniated Lumbar Disc M51.26    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    Hearing loss in right ear H91.91    Hypertension I10    Right Tinnitus H93.19    Reactive airway disease J45.909    Chronic low back pain M54.50, G89.29    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    Pigmentary dispersion syndrome, 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    Migraine headache G43.909    Myopia with astigmatism, bilateral H52.13, H52.203    Overweight E66.3    Anxiety F41.9    Depression F32.A    Hyperlipidemia E78.5    Lumbosacral spondylosis without myelopathy M47.817    COVID-19 virus infection 12/2020 U07.1            Past Surgical History:   Procedure Laterality Date    LEFT MENISCECTOMY  01/2012    Medial & Lateral    LUMBAR FUSION      Lumbar Vertebral Fusion Conversion Data     PR NJX DX/THER AGT PVRT FACET JT LMBR/SAC 1 LEVEL Bilateral 07/04/2019    Procedure: DX #2 BILAT MBB L3,4;  Surgeon: Atilano Median, DO;  Location: [facility];  Service: Orthopedics    SPINAL FUSION  1999    L5-S1    TYMPANOSTOMY TUBE PLACEMENT      VASECTOMY  07/01/2018     Family History   Problem  Relation Age of Onset    High cholesterol Father     Hypertension Father     Cancer Father     High Blood Pressure Father     Conversion Other         [id number]^Family Health Status^^Active^Father(A)HTN.Chol.Allergies Mother(A)Migraines 1 Brother(A)ADD 2 Sisters(A) 1 Daughter(A) PatGF(A)CAD.Glaucoma  DM2.Chol PatGM(D)49.MI MatGF(D)60s.Bone Ca MatGM(A)    Hearing loss Maternal Grandmother     Cancer Maternal Grandfather           Social History     Tobacco Use    Smoking status: Former     Packs/day: 1.00     Years: 6.00     Pack years: 6.00     Types: Cigarettes     Quit date: 12/07/2005     Years since quitting: 16.2    Smokeless tobacco: Never    Tobacco comments:     Quit 2007   Substance Use Topics    Alcohol use: Yes     Alcohol/week: 4.0 standard drinks of alcohol     Types: 4 Standard drinks or equivalent per week     Comment: social    Drug use: Yes     Types: Marijuana     Living Situation     Questions Responses    Patient lives with Family    Homeless No    Caregiver for other family member No    External Services None    Employment Employed    Comment: Print production plannerfuneral  director     Domestic Violence Risk No                Review of Systems   Review of Systems   Respiratory: Positive for shortness of breath (Sometimes). Negative for cough, chest tightness, wheezing and stridor.    Cardiovascular: Positive for palpitations (More of a fluttery sensation in his chest which he equates with palpitations). Negative for chest pain and leg swelling.   Gastrointestinal: Negative for abdominal pain, diarrhea and nausea.   Allergic/Immunologic: Negative for immunocompromised state.   Neurological: Negative for dizziness and light-headedness.       Physical Exam   Vitals      First Recorded BP: 128/90, Resp: 18, Temp: 36.4 C (97.5 F), Temp src: TEMPORAL Oxygen Therapy SpO2: 98 %, Heart Rate: 69, (03/05/22 1212)  .      Physical Exam  Vitals and nursing note reviewed.   Constitutional:       General: He is not in acute distress.  HENT:      Mouth/Throat:      Mouth: Mucous membranes are moist.   Eyes:      Pupils: Pupils are equal, round, and reactive to light.   Cardiovascular:      Rate and Rhythm: Normal rate and regular rhythm.      Pulses: Normal pulses.      Heart sounds: Normal heart sounds. No murmur heard.     No friction rub. No gallop.   Pulmonary:      Breath sounds: Normal breath sounds.   Musculoskeletal:         General: Normal range of motion.      Right lower leg: No edema.      Left lower leg: No edema.   Skin:     General: Skin is warm.      Capillary Refill: Capillary refill takes less than 2 seconds.   Neurological:      Mental Status: He is alert.  Medical Decision Making   Medical Decision Making  Assessment:    Fluttery sensation in the chest.  Sensation is ongoing and persistent.  Sensation was present when I examined the patient and the heart was regular rate and rhythm without murmurs or gallops or rubs.  Lungs are clear.    Differential diagnosis:    Anxiety  Chest wall issue  Palpitations not otherwise specified  Doubt cardiac ischemia    Plan and Results:     Check EKG  Encounter orders    Orders Placed This Encounter      EKG 12 lead        EKG Interpretation:      Normal sinus rhythm and No ischemic changes      Diagnosis and Disposition:   EKG shows normal sinus rhythm.  No ischemic change.  I told the patient I am not sure what he is experiencing but it does not seem to be cardiac ischemic changes or something consistent with PE or DVT or thoracic dissection etc.  My plan for him would be to give him some reassurance that the acute issues appear to be unlikely and he should follow-up with his primary care doctor.  However, I told him that if he develops any increasing symptoms such as pain or swelling in the feet or increased shortness of breath that he should go directly to the emergency department for an acute evaluation.  Patient understands and is agreeable with the plan      Final Diagnosis    ICD-10-CM ICD-9-CM   1. Fluttering sensation of heart  R00.2 785.1         Alvester Morin, MD

## 2022-03-06 LAB — EKG 12-LEAD
P: 62 deg
PR: 152 ms
QRS: -8 deg
QRSD: 90 ms
QT: 379 ms
QTc: 377 ms
Rate: 59 {beats}/min
T: 203 deg

## 2022-03-30 ENCOUNTER — Other Ambulatory Visit: Payer: Self-pay | Admitting: Primary Care

## 2022-03-30 MED ORDER — ROSUVASTATIN CALCIUM 10 MG PO TABS *I*
10.0000 mg | ORAL_TABLET | Freq: Every day | ORAL | 1 refills | Status: DC
Start: 2022-03-30 — End: 2022-04-09

## 2022-04-07 ENCOUNTER — Ambulatory Visit: Payer: No Typology Code available for payment source | Admitting: Primary Care

## 2022-04-09 ENCOUNTER — Other Ambulatory Visit: Payer: Self-pay | Admitting: Primary Care

## 2022-04-10 ENCOUNTER — Ambulatory Visit: Payer: No Typology Code available for payment source | Admitting: Primary Care

## 2022-04-10 MED ORDER — ROSUVASTATIN CALCIUM 10 MG PO TABS *I*
10.0000 mg | ORAL_TABLET | Freq: Every day | ORAL | 0 refills | Status: DC
Start: 2022-04-10 — End: 2022-08-12

## 2022-08-12 ENCOUNTER — Other Ambulatory Visit: Payer: Self-pay | Admitting: Primary Care

## 2022-08-13 MED ORDER — ROSUVASTATIN CALCIUM 10 MG PO TABS *I*
10.0000 mg | ORAL_TABLET | Freq: Every day | ORAL | 0 refills | Status: DC
Start: 2022-08-13 — End: 2022-10-12

## 2022-08-14 ENCOUNTER — Other Ambulatory Visit: Payer: Self-pay | Admitting: Primary Care

## 2022-09-09 ENCOUNTER — Other Ambulatory Visit: Payer: Self-pay

## 2022-09-09 ENCOUNTER — Ambulatory Visit: Payer: No Typology Code available for payment source

## 2022-09-09 ENCOUNTER — Encounter: Payer: Self-pay | Admitting: Primary Care

## 2022-09-09 VITALS — BP 124/74 | HR 64 | Temp 97.7°F | Ht 69.02 in | Wt 212.4 lb

## 2022-09-09 DIAGNOSIS — Z23 Encounter for immunization: Secondary | ICD-10-CM

## 2022-09-14 ENCOUNTER — Other Ambulatory Visit: Payer: Self-pay | Admitting: Primary Care

## 2022-10-12 ENCOUNTER — Ambulatory Visit: Payer: No Typology Code available for payment source | Admitting: Primary Care

## 2022-10-12 ENCOUNTER — Encounter: Payer: Self-pay | Admitting: Primary Care

## 2022-10-12 ENCOUNTER — Other Ambulatory Visit: Payer: Self-pay

## 2022-10-12 VITALS — BP 120/80 | HR 82 | Temp 98.0°F | Ht 69.0 in | Wt 214.0 lb

## 2022-10-12 DIAGNOSIS — Z7185 Encounter for immunization safety counseling: Secondary | ICD-10-CM

## 2022-10-12 DIAGNOSIS — E785 Hyperlipidemia, unspecified: Secondary | ICD-10-CM

## 2022-10-12 DIAGNOSIS — I10 Essential (primary) hypertension: Secondary | ICD-10-CM

## 2022-10-12 DIAGNOSIS — R7303 Prediabetes: Secondary | ICD-10-CM

## 2022-10-12 MED ORDER — ROSUVASTATIN CALCIUM 10 MG PO TABS *I*
10.0000 mg | ORAL_TABLET | Freq: Every day | ORAL | 1 refills | Status: DC
Start: 2022-10-12 — End: 2023-05-20

## 2022-10-12 NOTE — Progress Notes (Signed)
Chief Complaint   Patient presents with    Hyperglycemia    Hyperlipidemia    Hypertension     Current Outpatient Medications   Medication Sig Dispense Refill    rosuvastatin (CRESTOR) 10 mg tablet Take 1 tablet (10 mg total) by mouth daily 90 tablet 1    cholecalciferol (VITAMIN D) 2000 UNITS tablet Take 1 tablet (2,000 units total) by mouth 2 times daily (Patient not taking: Reported on 10/12/2022)       No current facility-administered medications for this visit.     Medications reviewed and no changes made    Current Outpatient Medications   Medication Sig Dispense Refill    rosuvastatin (CRESTOR) 10 mg tablet Take 1 tablet (10 mg total) by mouth daily 90 tablet 1    cholecalciferol (VITAMIN D) 2000 UNITS tablet Take 1 tablet (2,000 units total) by mouth 2 times daily (Patient not taking: Reported on 10/12/2022)       No current facility-administered medications for this visit.       CHOLESTEROL/PreDM MANAGEMENT: Chronic and Stable      BP 138/88   Pulse 82   Temp 36.7 C (98 F)   Ht 1.753 m (5\' 9" )   Wt 97.1 kg (214 lb)   SpO2 98%   BMI 31.60 kg/m    Vitals:    10/12/22 1320   BP: 120/80   Pulse:    Temp:    Weight:    Height:       DIET:       --Eggs:   [x] Yes 2/month          --Fast Food:  [x] Yes occasional pizza           --Junk Food:  [x] Yes-rare sweets. Occasional popcorn/nuts           --Milk:    Almond       --Butter:   none       --Fiber:   [x] Yes 2 cups berries with 1 banana smoothie daily. Salad a few times a week.  White pasta 2 weekends a month. Wheat bread       --Meat:  Red meat 0-1x week        EXERCISE:   2x week elliptical and treadmill for 1 mile. Walks dog    MEDICATIONS:  Ran out of Crestor a few weeks ago.     HYPERTENSION MANAGEMENT:  Chronic and Stable    PATIENT DENIES: [x]  Chest Pain. [x]  Palpitations. [x]  Shortness of Breath.    SYMPTOMS: Has a migraine ~2 a year which are mild. He will uses Advil as needed and sleep. Imitrex made him fatigued. Triggers: Stress/Bright  Lights/Excess caffeine/Fatigue.    HABITS:   --Sodium-   [] Yes  [x] No          --Smoking  [] Yes  [x] No                 --Caffeine Use- [x] Yes 2 cups coffee/d             HOME BLOOD PRESSURE:not taking    EXAM:    HEART: Regular rate and rhythm, no murmurs gallops or rubs.  LUNGS: Clear to auscultation bilaterally.    ASSESSMENT/PLAN:    1.  HTN: Presently well-controlled and at goal of less than 140/90.  Diet and exercise reviewed.  Currently on no medicine  2.  Hyperlipidemia: His last profile was not at goal, however he was off of his Crestor.  He also currently is off of  Crestor as above and I refilled a prescription for him.  Order placed to check FLP/LFTs in 1 month  3.  Prediabetes: Currently on no medicine.  Diet and exercise reviewed.  Check fasting SMA-8/A1c  4.  Health maintenance: He is now 46 years of age and is due for a screening colonoscopy.  I referred him to Dr. Bethann Goo and gave him a Pre-colonoscopy packet  5.  Vaccine counseling: He has had his flu shot.  Discussed getting a COVID-19 booster at his pharmacy which he is interested in doing.  Discussed he is a candidate for Prevnar 20 given his reactive airway disease, however he prefers to hold off since he is asymptomatic         RETURN: 6 months HTN/cholesterol/prediabetes

## 2022-12-31 ENCOUNTER — Other Ambulatory Visit: Payer: Self-pay | Admitting: Gastroenterology

## 2022-12-31 ENCOUNTER — Other Ambulatory Visit
Admission: RE | Admit: 2022-12-31 | Discharge: 2022-12-31 | Disposition: A | Discharge status: Home / Self Care | Payer: No Typology Code available for payment source | Source: Ambulatory Visit | Attending: Student in an Organized Health Care Education/Training Program | Admitting: Student in an Organized Health Care Education/Training Program

## 2022-12-31 DIAGNOSIS — Z1211 Encounter for screening for malignant neoplasm of colon: Secondary | ICD-10-CM | POA: Insufficient documentation

## 2022-12-31 LAB — HM COLONOSCOPY

## 2022-12-31 NOTE — Procedures (Signed)
[facility] Colonoscopy Procedure Note             Attending Physician: Leanora Ivanoff, MD    Indications: Average risk screening colonoscopy    Informed Consent: Prior to the procedure, the patient was explained the risk, benefits and alternatives including the risk of bleeding, infection, perforation, missed lesions and informed consent was obtained. Risk of moderation sedation was explained prior to the procedure including hypoxia, hypotension and bradycardia and informed consent was obtained.      Medications:Fentanyl 100 mcg IV and Midazolam 7 mg IV were administered incrementally over the course of the procedure to achieve an adequate level of conscious sedation.     ASA Class: 2      Exam:  Gen: NAD, AO x 3  HEENT: MMM, PERRL  CV:RRR, nl S1 and S2  PULM: CTABL, no wheezing or rhonchi  ABD: +BS, NT/ND, no rebound or guarding  EXT: WWP, no edema    Procedure Details: The patient was placed in the left lateral decubitus position and monitored continuously with ECG tracing, pulse oximetry, blood pressure monitoring and direct observations. After anorectal examination was performed, the Olympus CFHQ-190 was inserted into the rectum and advanced under direct vision to the cecum, which was identified by  the ileocecal valve and the appendiceal orifice. The procedure was considered not difficult..    Narrative:  During withdrawal examination, the final quality of the prep was Boston Bowel Prep Scale     Boston bowel preparation score:   Right colon grade3- (entire mucosa of colon segment seen well, with no residual staining, small fragments of stool, or opaque liquid)  Transverse colon grade3- (entire mucosa of colon segment seen well, with no residual staining, small fragments of stool, or opaque liquid)  Left colon grade3- (entire mucosa of colon segment seen well, with no residual staining, small fragments of stool, or opaque liquid)    A careful inspection was made as the colonoscope  was withdrawn, a retroflexed view of the rectum was included; findings and interventions are described below. Appropriate photo documentation wasobtained. The patient  recovered in the [facility].    Findings:     Colon:       Normal cecum  Normal ascending colon  Normal transverse colon  Normal descending colon  Normal sigmoid colon  Rectum with 2mm sessile polyp s/p cold forceps polypectomy. Normal retroflexion and DRE.      Complications: none    Estimated Blood Loss:  There was no significant blood loss during the procedure.    Impression:   1)13mm sessile polyp    Recommendations:   1)Await pathology and if adenomatous polyp then repeat colonoscopy in 5 years. If hyperplastic polyp then repeat colonoscopy in 10 years.        Leanora Ivanoff, MD  Office Phone #: [phone]

## 2023-01-13 LAB — SURGICAL PATHOLOGY

## 2023-01-14 ENCOUNTER — Encounter: Payer: Self-pay | Admitting: Primary Care

## 2023-04-19 ENCOUNTER — Encounter: Payer: Self-pay | Admitting: Primary Care

## 2023-04-19 ENCOUNTER — Other Ambulatory Visit: Payer: Self-pay

## 2023-04-19 ENCOUNTER — Ambulatory Visit: Payer: No Typology Code available for payment source | Admitting: Primary Care

## 2023-04-19 VITALS — BP 122/74 | HR 72 | Temp 97.6°F | Ht 69.02 in | Wt 217.0 lb

## 2023-04-19 DIAGNOSIS — E785 Hyperlipidemia, unspecified: Secondary | ICD-10-CM

## 2023-04-19 DIAGNOSIS — I10 Essential (primary) hypertension: Secondary | ICD-10-CM

## 2023-04-19 DIAGNOSIS — R7303 Prediabetes: Secondary | ICD-10-CM

## 2023-04-19 NOTE — Progress Notes (Signed)
Chief Complaint   Patient presents with    Hyperlipidemia    prediabetes    Hypertension     Current Outpatient Medications   Medication Sig Dispense Refill    buPROPion (WELLBUTRIN XL) 150 mg 24 hr tablet Take 1 tablet (150 mg total) by mouth daily.      rosuvastatin (CRESTOR) 10 mg tablet Take 1 tablet (10 mg total) by mouth daily 90 tablet 1    cholecalciferol, Vitamin D3, (CHOLECALCIFEROL) 50 mcg (2,000 unit) tablet Take 1 tablet (2,000 units total) by mouth daily.       No current facility-administered medications for this visit.     Current Outpatient Medications   Medication Sig Dispense Refill    buPROPion (WELLBUTRIN XL) 150 mg 24 hr tablet Take 1 tablet (150 mg total) by mouth daily.      rosuvastatin (CRESTOR) 10 mg tablet Take 1 tablet (10 mg total) by mouth daily 90 tablet 1    cholecalciferol, Vitamin D3, (CHOLECALCIFEROL) 50 mcg (2,000 unit) tablet Take 1 tablet (2,000 units total) by mouth daily.       No current facility-administered medications for this visit.       CHOLESTEROL MANAGEMENT: Chronic and Stable      BP 122/74 (BP Location: Left arm)   Pulse 72   Temp 36.4 C (97.6 F)   Ht 1.753 m (5' 9.02")   Wt 98.4 kg (217 lb)   SpO2 96%   BMI 32.03 kg/m      DIET:       --Eggs:   [x] Yes 1 a week           --Fast Food:  [x] Yes 2x month          --Junk Food:  [x] Yes Occasional ice cream/hard pretzles          --Milk:   Almond        --Butter:   none       --Fiber:   [x] Yes mixed berry/greens powder/banana smoothie 4x week. Wheat bread, white bagels/pasta       MEDICATIONS: none    HYPERTENSION MANAGEMENT:  Chronic and Stable    PATIENT DENIES: [x]  Headaches. [x]  Chest Pain. [x]  Palpitations. [x]  Shortness of Breath.    HABITS:   --Sodium-   [] Yes  [x] No          --Exercise-  [] Yes  [x] No          --Smoking-  [] Yes  [x] No          --Caffeine Use- [x] Yes 2 cups coffee/d            HOME BLOOD PRESSURE: sporadic with parents cuff--normal    EXAM:    HEART: Regular rate and rhythm, no murmurs  gallops or rubs.  LUNGS: Clear to auscultation bilaterally.     ASSESSMENT/PLAN:    HTN:  Presently well-controlled and at goal of less than 140/90.  Diet and exercise reviewed.  Currently on no medicine  Hyperlipidemia: His last profile was not at goal.  He still has to do his blood work from November.  Continue Crestor.  Diet and exercise reviewed  Prediabetes: Currently on no medicine.  Diet and exercise reviewed.  I will await his blood work from November      RETURN: 6 months HTN/cholesterol/prediabetes

## 2023-05-20 ENCOUNTER — Other Ambulatory Visit: Payer: Self-pay | Admitting: Primary Care

## 2023-05-20 MED ORDER — ROSUVASTATIN CALCIUM 10 MG PO TABS *I*
10.0000 mg | ORAL_TABLET | Freq: Every day | ORAL | 1 refills | Status: DC
Start: 2023-05-20 — End: 2023-10-18

## 2023-06-08 ENCOUNTER — Encounter: Payer: Self-pay | Admitting: Primary Care

## 2023-06-09 ENCOUNTER — Ambulatory Visit: Payer: No Typology Code available for payment source | Admitting: Primary Care

## 2023-06-09 ENCOUNTER — Ambulatory Visit
Admission: RE | Admit: 2023-06-09 | Discharge: 2023-06-09 | Disposition: A | Discharge status: Home / Self Care | Payer: No Typology Code available for payment source | Source: Ambulatory Visit

## 2023-06-09 ENCOUNTER — Encounter: Payer: Self-pay | Admitting: Primary Care

## 2023-06-09 ENCOUNTER — Other Ambulatory Visit: Payer: Self-pay | Admitting: Primary Care

## 2023-06-09 ENCOUNTER — Other Ambulatory Visit: Payer: Self-pay

## 2023-06-09 VITALS — BP 116/72 | HR 68 | Temp 97.6°F | Ht 69.02 in | Wt 211.6 lb

## 2023-06-09 DIAGNOSIS — M25552 Pain in left hip: Secondary | ICD-10-CM

## 2023-06-09 DIAGNOSIS — M1612 Unilateral primary osteoarthritis, left hip: Secondary | ICD-10-CM

## 2023-06-09 NOTE — Progress Notes (Signed)
Chief Complaint   Patient presents with    Hip Pain     Current Outpatient Medications   Medication Sig Dispense Refill    rosuvastatin (CRESTOR) 10 mg tablet Take 1 tablet (10 mg total) by mouth daily. 90 tablet 1    buPROPion (WELLBUTRIN XL) 150 mg 24 hr tablet Take 1 tablet (150 mg total) by mouth daily.      cholecalciferol, Vitamin D3, (CHOLECALCIFEROL) 50 mcg (2,000 unit) tablet Take 1 tablet (2,000 units total) by mouth daily.       No current facility-administered medications for this visit.     Vitals:    06/09/23 0831   BP: 116/72   Pulse: 68   Temp: 36.4 C (97.6 F)   Weight: 96 kg (211 lb 9.6 oz)   Height: 1.753 m (5' 9.02")     He has left hip pain on and off since last winter after skiing. The pain can radiate to his groin. No groin bulges. No sciatica. Pain is worse with putting socks on, crossing left leg while sitting. Has not taken any pain meds.     EXAM:    LEFT HIP:  no palpable tenderness over greater trochanter. Full ROM with pain upon Abduction.     Assessment/Plan:    1.  Left hip pain: I will check an x-ray of his left hip/AP pelvis to rule out DJD.  He also could have a hip flexor muscle issue.  Avoid triggering activities and positions as best as possible.  Okay to use Aleve 1 tablet twice a day with food for up to 10 days.  He may need orthopedic consultation/physical therapy depending on the x-ray results    RETURN: 10/18/2023 HTN/cholesterol/prediabetes

## 2023-10-18 ENCOUNTER — Other Ambulatory Visit: Payer: Self-pay | Admitting: Primary Care

## 2023-10-18 ENCOUNTER — Encounter: Payer: Self-pay | Admitting: Primary Care

## 2023-10-18 ENCOUNTER — Other Ambulatory Visit: Payer: Self-pay

## 2023-10-18 ENCOUNTER — Ambulatory Visit: Payer: No Typology Code available for payment source | Admitting: Primary Care

## 2023-10-18 VITALS — BP 128/80 | HR 72 | Temp 97.1°F | Ht 69.0 in | Wt 214.0 lb

## 2023-10-18 DIAGNOSIS — R7303 Prediabetes: Secondary | ICD-10-CM | POA: Insufficient documentation

## 2023-10-18 DIAGNOSIS — I10 Essential (primary) hypertension: Secondary | ICD-10-CM

## 2023-10-18 DIAGNOSIS — Z23 Encounter for immunization: Secondary | ICD-10-CM

## 2023-10-18 DIAGNOSIS — E785 Hyperlipidemia, unspecified: Secondary | ICD-10-CM

## 2023-10-18 MED ORDER — ROSUVASTATIN CALCIUM 10 MG PO TABS *I*
10.0000 mg | ORAL_TABLET | Freq: Every day | ORAL | 0 refills | Status: DC
Start: 2023-10-18 — End: 2024-01-24

## 2023-10-18 NOTE — Progress Notes (Signed)
Administered Moderna Spikevax IM into Right deltoid. Patient tolerated well.  Timoteo Gaul, LPN 16/09/9603 5:40 PM

## 2023-10-18 NOTE — Progress Notes (Signed)
Chief Complaint   Patient presents with    PreDiabetes    Hyperlipidemia    Hypertension     Current Outpatient Medications   Medication Sig Dispense Refill    cholecalciferol, Vitamin D3, (CHOLECALCIFEROL) 50 mcg (2,000 unit) tablet Take 1 tablet (2,000 units total) by mouth daily.      rosuvastatin (CRESTOR) 10 mg tablet Take 1 tablet (10 mg total) by mouth daily. 90 tablet 0     No current facility-administered medications for this visit.     Current Outpatient Medications   Medication Sig Dispense Refill    cholecalciferol, Vitamin D3, (CHOLECALCIFEROL) 50 mcg (2,000 unit) tablet Take 1 tablet (2,000 units total) by mouth daily.      rosuvastatin (CRESTOR) 10 mg tablet Take 1 tablet (10 mg total) by mouth daily. 90 tablet 0     No current facility-administered medications for this visit.       CHOLESTEROL/PreDM MANAGEMENT: Chronic and Stable      BP 128/80   Pulse 72   Temp 36.2 C (97.1 F) (Temporal)   Ht 1.753 m (5\' 9" )   Wt 97.1 kg (214 lb)   SpO2 99%   BMI 31.60 kg/m      DIET:       --Eggs:   [x] Yes 2/week          --Fast Food:  [x] Yes 2 slices pizza a month         --Junk Food:  [x] Yes Ice cream 1x week          --Milk:   Almond         --Butter:   no       --Fiber:   [x] Yes fruit smoothie with greens powder 3x week. Occasional salad/veges at dinner.          --Meat:  Red meat 1x week      MEDICATIONS:  ran out of Crestor 3 days ago    HYPERTENSION MANAGEMENT:  Chronic and Stable    PATIENT DENIES: [x]  Headaches. [x]  Chest Pain. [x]  Palpitations. [x]  Shortness of Breath.    HABITS:   --Sodium-   [] Yes  [x] No          --Exercise-  [] Yes  [x] No but belongs to [employer]         --Smoking-  [] Yes  [x] No          --Caffeine Use- [x] Yes 2 cups coffee/d             HOME BLOOD PRESSURE: no    EXAM:    HEART: Regular rate and rhythm, no murmurs gallops or rubs.  LUNGS: Clear to auscultation bilaterally.      ASSESSMENT/PLAN:    HTN: Presently well-controlled and at goal of less than 140/90.  Diet and exercise  reviewed.  He is currently on no medicine  Hyperlipidemia: His last profile was not at goal.  He is on Crestor.  Diet and exercise reviewed.  He still has to do his FLP/LFTs which I reordered  PreDM: Borderline and currently on no medicine.  Diet and exercise reviewed.  He/to do this fasting SMA-8/A1c which I reordered  Vaccine Counseling: Flu shot and Moderna COVID-19 shot given       RETURN:  5 months HTN/Cholesterol/PreDM

## 2024-01-24 ENCOUNTER — Other Ambulatory Visit: Payer: Self-pay | Admitting: Primary Care

## 2024-01-24 MED ORDER — ROSUVASTATIN CALCIUM 10 MG PO TABS *I*
10.0000 mg | ORAL_TABLET | Freq: Every day | ORAL | 0 refills | Status: DC
Start: 2024-01-24 — End: 2024-02-16

## 2024-01-24 NOTE — Telephone Encounter (Signed)
Spoke with patient and advised that he needs to get labs done prior to next refill. Patient voiced understanding.  Timoteo Gaul, LPN 0/98/1191 4:78 PM

## 2024-02-14 ENCOUNTER — Encounter: Payer: Self-pay | Admitting: Primary Care

## 2024-02-15 ENCOUNTER — Other Ambulatory Visit
Admission: RE | Admit: 2024-02-15 | Discharge: 2024-02-15 | Disposition: A | Discharge status: Home / Self Care | Source: Ambulatory Visit | Attending: Primary Care | Admitting: Primary Care

## 2024-02-15 ENCOUNTER — Encounter: Payer: Self-pay | Admitting: Primary Care

## 2024-02-15 DIAGNOSIS — E785 Hyperlipidemia, unspecified: Secondary | ICD-10-CM | POA: Insufficient documentation

## 2024-02-15 DIAGNOSIS — R7303 Prediabetes: Secondary | ICD-10-CM | POA: Insufficient documentation

## 2024-02-15 LAB — BASIC METABOLIC PANEL
Anion Gap: 12 (ref 7–16)
CO2: 26 mmol/L (ref 20–28)
Calcium: 10 mg/dL (ref 8.6–10.2)
Chloride: 102 mmol/L (ref 96–108)
Creatinine: 0.94 mg/dL (ref 0.67–1.17)
Glucose: 93 mg/dL (ref 60–99)
Lab: 15 mg/dL (ref 6–20)
Potassium: 4.6 mmol/L (ref 3.3–5.1)
Sodium: 140 mmol/L (ref 133–145)
eGFR BY CREAT: 101 *

## 2024-02-15 LAB — ALT: ALT: 30 U/L (ref 0–50)

## 2024-02-15 LAB — LIPID PANEL
Chol/HDL Ratio: 3.9
Cholesterol: 213 mg/dL — AB
HDL: 55 mg/dL (ref 40–60)
LDL Calculated: 139 mg/dL — AB
Non HDL Cholesterol: 158 mg/dL
Triglycerides: 105 mg/dL

## 2024-02-15 LAB — AST: AST: 21 U/L (ref 0–50)

## 2024-02-15 LAB — HEMOGLOBIN A1C: Hemoglobin A1C: 5.7 % — ABNORMAL HIGH

## 2024-02-16 ENCOUNTER — Other Ambulatory Visit: Payer: Self-pay | Admitting: Primary Care

## 2024-02-16 MED ORDER — ROSUVASTATIN CALCIUM 10 MG PO TABS *I*
10.0000 mg | ORAL_TABLET | Freq: Every day | ORAL | 1 refills | Status: DC
Start: 2024-02-16 — End: 2024-06-28

## 2024-03-16 ENCOUNTER — Other Ambulatory Visit: Payer: Self-pay

## 2024-03-17 ENCOUNTER — Ambulatory Visit: Payer: No Typology Code available for payment source | Admitting: Primary Care

## 2024-03-17 ENCOUNTER — Other Ambulatory Visit: Payer: Self-pay

## 2024-03-17 ENCOUNTER — Encounter: Payer: Self-pay | Admitting: Primary Care

## 2024-03-17 VITALS — BP 114/68 | HR 64 | Temp 95.9°F | Ht 69.0 in | Wt 211.0 lb

## 2024-03-17 DIAGNOSIS — R7303 Prediabetes: Secondary | ICD-10-CM

## 2024-03-17 DIAGNOSIS — I10 Essential (primary) hypertension: Secondary | ICD-10-CM

## 2024-03-17 DIAGNOSIS — E785 Hyperlipidemia, unspecified: Secondary | ICD-10-CM

## 2024-03-17 NOTE — Progress Notes (Signed)
 Chief Complaint   Patient presents with    Hyperlipidemia    Hypertension    PreDiabetes     Current Outpatient Medications   Medication Sig Dispense Refill    rosuvastatin (CRESTOR) 10 mg tablet Take 1 tablet (10 mg total) by mouth daily. 90 tablet 1    cholecalciferol, Vitamin D3, (CHOLECALCIFEROL) 50 mcg (2,000 unit) tablet Take 1 tablet (2,000 units total) by mouth daily.       No current facility-administered medications for this visit.     Medications reviewed and no changes made    Current Outpatient Medications   Medication Sig Dispense Refill    rosuvastatin (CRESTOR) 10 mg tablet Take 1 tablet (10 mg total) by mouth daily. 90 tablet 1    cholecalciferol, Vitamin D3, (CHOLECALCIFEROL) 50 mcg (2,000 unit) tablet Take 1 tablet (2,000 units total) by mouth daily.       No current facility-administered medications for this visit.       CHOLESTEROL/PreDM MANAGEMENT: Chronic and Stable      BP 114/68   Pulse 64   Temp 35.5 C (95.9 F) (Temporal)   Ht 1.753 m (5\' 9" )   Wt 95.7 kg (211 lb)   SpO2 99%   BMI 31.16 kg/m      DIET:       --Eggs:   [x] Yes 0-4/week           --Fast Food:  [] Yes  [x] No          --Junk Food:  [x] Yes likes hard pretzels/occasional ice cream             --Milk:   Almond        --Butter:   none       --Fiber:   [x] Yes Occasional vege/salad/oranges/apples. 3x week Fruit smoothie with greens powder        --Meat:  Red meat ~1x week      MEDICATIONS: takes Crestor    HYPERTENSION MANAGEMENT:  Chronic and Stable    PATIENT DENIES: [x]  Headaches. [x]  Palpitations.     SYMPTOMS: He took Prednisone 3/30 for a hives on back/hands/left face. He was in [place], [place]. 3/28-4/1 (flew).  Finished Prednisone 03/12/24.  He did feel more agitated on the Prednisone. On 4/4, he developed a sinking feeling substernal daily nearly all day. No radiation. No heartburn. No change with eating. No calf pain/swelling    HABITS:   --Sodium-   [] Yes  [x] No          --Exercise-  [] Yes  [x] No but has [employer]  membership         --Smoking-  [] Yes  [x] No          --Caffeine Use- [x] Yes 2-3 cups coffee/d             HOME BLOOD PRESSURE: not taking    LABS:     Latest Reference Range & Units 02/15/24 11:37   Cholesterol mg/dL 161 !   Triglycerides mg/dL 096   HDL Cholesterol 40 - 60 mg/dL 55   LDL Calculated mg/dL 045 !   Non HDL Cholesterol mg/dL 409   Chol/HDL Ratio  3.9   Glucose 60 - 99 mg/dL 93   Hemoglobin W1X % 5.7 (H)     EXAM:    HEART: Regular rate and rhythm, no murmurs gallops or rubs.  LUNGS: Clear to auscultation bilaterally.    ASSESSMENT/PLAN:    1.  HTN: Presently well-controlled and at goal of less than 140/90.  Diet  and exercise reviewed.  He is currently on no medicine  2.  Hyperlipidemia: His last profile was not at goal on Crestor.  He will try to start exercising.  Diet reviewed.  He may need a higher dose of Crestor.  3.  Prediabetes: Recent labs reviewed.  He is currently stable on no medicine.  Diet and exercise reviewed      RETURN: 5 months HTN/cholesterol/prediabetes

## 2024-04-04 ENCOUNTER — Encounter: Payer: Self-pay | Admitting: Primary Care

## 2024-04-07 ENCOUNTER — Ambulatory Visit: Admitting: Primary Care

## 2024-04-07 ENCOUNTER — Encounter: Payer: Self-pay | Admitting: Primary Care

## 2024-04-07 ENCOUNTER — Other Ambulatory Visit: Payer: Self-pay

## 2024-04-07 VITALS — BP 110/80 | HR 72 | Temp 96.8°F | Ht 69.0 in | Wt 212.0 lb

## 2024-04-07 DIAGNOSIS — R12 Heartburn: Secondary | ICD-10-CM

## 2024-04-07 DIAGNOSIS — F419 Anxiety disorder, unspecified: Secondary | ICD-10-CM

## 2024-04-07 DIAGNOSIS — R002 Palpitations: Secondary | ICD-10-CM

## 2024-04-07 NOTE — Addendum Note (Signed)
 Addended by: Vonnie Gubler on: 04/07/2024 04:57 PM     Modules accepted: Orders

## 2024-04-07 NOTE — Progress Notes (Signed)
 Chief Complaint   Patient presents with    Other     Palpitations     Current Outpatient Medications   Medication Sig Dispense Refill    cholecalciferol, Vitamin D3, (CHOLECALCIFEROL) 50 mcg (2,000 unit) tablet Take 1 tablet (2,000 units total) by mouth daily.      rosuvastatin  (CRESTOR ) 10 mg tablet Take 1 tablet (10 mg total) by mouth daily. 90 tablet 1     No current facility-administered medications for this visit.     Vitals:    04/07/24 0929   BP: (!) 130/96   Pulse: 72   Temp: 36 C (96.8 F)   Weight: 96.2 kg (212 lb)   Height: 1.753 m (5' 9)     Vitals:    04/07/24 1024   BP: 110/80   Pulse:    Temp:    Weight:    Height:      EKG:  NSR 64. Old Q/inverted T/LAD. Compared to 03/05/22 and 05/27/15    For the past 2 weeks, he has some heart pounding but no skipping/CP. It is more prominent if feeling stressed. Occasional light headed/SOB. No wheezing. He just started using elliptical for 20 minutes and goes for 3.5 mile walk this week with no symptoms. He feels he has more heartburn/belching the past 2 weeks. Tried Tums x 2 days which did not help. Drinks 2-3 cups coffee/d. No spicy food. Drinks 2 glasses wine a week.   He had a negative exercise echo 05/30/15.     Exam:    AFFECT: Pleasant and cheerful in no acute distress  HEART: Regular rate and rhythm, no murmurs gallops or rubs.  LUNGS: Clear to auscultation bilaterally.    Assessment/Plan:    1.  Palpitations/heart pounding: EKG was unrevealing.  Consider Holter monitor if symptoms persist.  He will continue trying to journal his symptoms especially if they are correlated more with anxiety.  If so, he should reach out to his psychiatrist, Dr. Barnie Maier.  He has used Klonopin  in the past.  Minimize caffeine  2.  Heartburn: GERD diet handout given and reviewed.  Okay to try OTC Prilosec for the next 2 weeks.  Call if no better/worse    RETURN: 08/11/2024 HTN/cholesterol/prediabetes

## 2024-06-28 ENCOUNTER — Other Ambulatory Visit: Payer: Self-pay | Admitting: Primary Care

## 2024-06-28 MED ORDER — ROSUVASTATIN CALCIUM 10 MG PO TABS *I*: 10.0000 mg | ORAL_TABLET | Freq: Every day | ORAL | 1 refills | Status: DC

## 2024-07-05 ENCOUNTER — Encounter: Payer: Self-pay | Admitting: Primary Care

## 2024-07-11 ENCOUNTER — Encounter: Payer: Self-pay | Admitting: Primary Care

## 2024-08-11 ENCOUNTER — Encounter: Payer: Self-pay | Admitting: Primary Care

## 2024-08-11 ENCOUNTER — Ambulatory Visit: Admitting: Primary Care

## 2024-08-11 ENCOUNTER — Other Ambulatory Visit: Payer: Self-pay

## 2024-08-11 VITALS — BP 114/82 | HR 64 | Temp 97.7°F | Ht 69.0 in | Wt 211.0 lb

## 2024-08-11 DIAGNOSIS — I10 Essential (primary) hypertension: Secondary | ICD-10-CM

## 2024-08-11 DIAGNOSIS — Z5321 Procedure and treatment not carried out due to patient leaving prior to being seen by health care provider: Secondary | ICD-10-CM

## 2024-08-11 DIAGNOSIS — R7303 Prediabetes: Secondary | ICD-10-CM

## 2024-08-11 DIAGNOSIS — E785 Hyperlipidemia, unspecified: Secondary | ICD-10-CM

## 2024-08-11 NOTE — Progress Notes (Signed)
 Patient left prior to being seen due to a work commitment

## 2024-09-15 ENCOUNTER — Encounter: Payer: Self-pay | Admitting: Internal Medicine

## 2024-12-26 ENCOUNTER — Other Ambulatory Visit: Payer: Self-pay | Admitting: Primary Care

## 2024-12-26 MED ORDER — ROSUVASTATIN CALCIUM 10 MG PO TABS *I*: 10.0000 mg | ORAL_TABLET | Freq: Every day | ORAL | 0 refills | Status: AC
